# Patient Record
Sex: Female | Born: 1950 | Race: White | Hispanic: No | Marital: Married | Smoking: Former smoker
Health system: Southern US, Community
[De-identification: ages and names within clinical notes are randomized; demographics above are authoritative.]

## PROBLEM LIST (undated history)

## (undated) DIAGNOSIS — I5020 Unspecified systolic (congestive) heart failure: Secondary | ICD-10-CM

## (undated) DIAGNOSIS — I739 Peripheral vascular disease, unspecified: Secondary | ICD-10-CM

## (undated) DIAGNOSIS — R519 Headache, unspecified: Secondary | ICD-10-CM

## (undated) DIAGNOSIS — E7800 Pure hypercholesterolemia, unspecified: Secondary | ICD-10-CM

## (undated) DIAGNOSIS — N809 Endometriosis, unspecified: Secondary | ICD-10-CM

## (undated) DIAGNOSIS — R0600 Dyspnea, unspecified: Secondary | ICD-10-CM

## (undated) DIAGNOSIS — R51 Headache: Secondary | ICD-10-CM

## (undated) DIAGNOSIS — I2510 Atherosclerotic heart disease of native coronary artery without angina pectoris: Principal | ICD-10-CM

## (undated) DIAGNOSIS — I10 Essential (primary) hypertension: Secondary | ICD-10-CM

## (undated) HISTORY — DX: Peripheral vascular disease, unspecified: I73.9

## (undated) HISTORY — PX: CORONARY ARTERY BYPASS GRAFT: SHX141

## (undated) HISTORY — DX: Atherosclerotic heart disease of native coronary artery without angina pectoris: I25.10

## (undated) HISTORY — DX: Pure hypercholesterolemia, unspecified: E78.00

## (undated) HISTORY — PX: EYE SURGERY: SHX253

## (undated) HISTORY — DX: Endometriosis, unspecified: N80.9

## (undated) HISTORY — PX: ABDOMINAL HYSTERECTOMY: SHX81

## (undated) HISTORY — PX: VASCULAR SURGERY: SHX849

## (undated) HISTORY — DX: Essential (primary) hypertension: I10

---

## 2001-02-02 DIAGNOSIS — I2510 Atherosclerotic heart disease of native coronary artery without angina pectoris: Secondary | ICD-10-CM

## 2001-02-02 HISTORY — DX: Atherosclerotic heart disease of native coronary artery without angina pectoris: I25.10

## 2002-02-02 HISTORY — PX: BREAST SURGERY: SHX581

## 2002-09-27 ENCOUNTER — Ambulatory Visit (HOSPITAL_COMMUNITY): Admission: RE | Admit: 2002-09-27 | Discharge: 2002-09-28 | Payer: Self-pay | Admitting: *Deleted

## 2003-12-04 ENCOUNTER — Ambulatory Visit: Payer: Self-pay | Admitting: Internal Medicine

## 2004-02-19 ENCOUNTER — Ambulatory Visit: Payer: Self-pay | Admitting: *Deleted

## 2005-02-02 HISTORY — PX: OTHER SURGICAL HISTORY: SHX169

## 2005-10-06 ENCOUNTER — Ambulatory Visit: Payer: Self-pay | Admitting: Internal Medicine

## 2006-10-18 ENCOUNTER — Ambulatory Visit: Payer: Self-pay | Admitting: Internal Medicine

## 2008-11-29 ENCOUNTER — Encounter: Payer: Self-pay | Admitting: Internal Medicine

## 2008-12-03 ENCOUNTER — Encounter: Payer: Self-pay | Admitting: Internal Medicine

## 2009-01-02 ENCOUNTER — Encounter: Payer: Self-pay | Admitting: Internal Medicine

## 2009-01-30 ENCOUNTER — Ambulatory Visit: Payer: Self-pay | Admitting: Family Medicine

## 2009-02-02 ENCOUNTER — Encounter: Payer: Self-pay | Admitting: Internal Medicine

## 2009-04-19 ENCOUNTER — Emergency Department: Payer: Self-pay | Admitting: Emergency Medicine

## 2010-09-27 ENCOUNTER — Ambulatory Visit: Payer: Self-pay | Admitting: Family Medicine

## 2010-10-03 ENCOUNTER — Ambulatory Visit: Payer: Self-pay | Admitting: Otolaryngology

## 2011-06-04 LAB — IFOBT (OCCULT BLOOD): IFOBT: NEGATIVE

## 2011-12-01 ENCOUNTER — Ambulatory Visit (INDEPENDENT_AMBULATORY_CARE_PROVIDER_SITE_OTHER): Payer: PRIVATE HEALTH INSURANCE | Admitting: Internal Medicine

## 2011-12-01 VITALS — BP 128/82 | HR 72 | Temp 98.5°F | Ht 68.0 in | Wt 178.0 lb

## 2011-12-01 DIAGNOSIS — I739 Peripheral vascular disease, unspecified: Secondary | ICD-10-CM

## 2011-12-01 DIAGNOSIS — R5383 Other fatigue: Secondary | ICD-10-CM

## 2011-12-01 DIAGNOSIS — E7800 Pure hypercholesterolemia, unspecified: Secondary | ICD-10-CM | POA: Insufficient documentation

## 2011-12-01 DIAGNOSIS — I2510 Atherosclerotic heart disease of native coronary artery without angina pectoris: Secondary | ICD-10-CM | POA: Insufficient documentation

## 2011-12-01 DIAGNOSIS — I10 Essential (primary) hypertension: Secondary | ICD-10-CM

## 2011-12-01 MED ORDER — NITROGLYCERIN 0.4 MG SL SUBL: 0.4000 mg | SUBLINGUAL_TABLET | SUBLINGUAL | Status: AC | PRN

## 2011-12-01 MED ORDER — FUROSEMIDE 40 MG PO TABS: 40.0000 mg | ORAL_TABLET | Freq: Every day | ORAL | Status: DC

## 2011-12-01 MED ORDER — CLOPIDOGREL BISULFATE 75 MG PO TABS: 75.0000 mg | ORAL_TABLET | Freq: Every day | ORAL | Status: DC

## 2011-12-01 MED ORDER — SPIRONOLACTONE 25 MG PO TABS: 25.0000 mg | ORAL_TABLET | Freq: Every day | ORAL | Status: DC

## 2011-12-01 MED ORDER — ISOSORBIDE MONONITRATE ER 60 MG PO TB24: 120.0000 mg | ORAL_TABLET | Freq: Every day | ORAL | Status: DC

## 2011-12-01 MED ORDER — ALPRAZOLAM 0.5 MG PO TABS: 0.5000 mg | ORAL_TABLET | Freq: Three times a day (TID) | ORAL | Status: DC | PRN

## 2011-12-01 MED ORDER — CARVEDILOL 3.125 MG PO TABS: 3.1250 mg | ORAL_TABLET | Freq: Two times a day (BID) | ORAL | Status: AC

## 2011-12-01 MED ORDER — ISOSORBIDE MONONITRATE ER 30 MG PO TB24: 120.0000 mg | ORAL_TABLET | Freq: Every evening | ORAL | Status: DC

## 2011-12-01 MED ORDER — PRAVASTATIN SODIUM 80 MG PO TABS: 80.0000 mg | ORAL_TABLET | Freq: Every day | ORAL | Status: DC

## 2011-12-01 MED ORDER — CEFDINIR 300 MG PO CAPS: 300.0000 mg | ORAL_CAPSULE | Freq: Two times a day (BID) | ORAL | Status: DC

## 2011-12-01 NOTE — Patient Instructions (Addendum)
It was nice seeing you today.  I am sorry to hear about your family.  Let me know if you need anything.  I am going to give you a prescription for Omnicef to have if needed.

## 2011-12-02 ENCOUNTER — Telehealth: Payer: Self-pay | Admitting: *Deleted

## 2011-12-02 NOTE — Telephone Encounter (Signed)
Left message on home phone for patient to return call, concerning rx through Dr. Lorin Picket.

## 2011-12-07 ENCOUNTER — Encounter: Payer: Self-pay | Admitting: Internal Medicine

## 2011-12-07 DIAGNOSIS — I739 Peripheral vascular disease, unspecified: Secondary | ICD-10-CM | POA: Insufficient documentation

## 2011-12-07 NOTE — Assessment & Plan Note (Signed)
Blood pressure is doing well. Same meds.  Recent met b 11/25/11 wnl.

## 2011-12-07 NOTE — Assessment & Plan Note (Signed)
Low cholesterol diet and exercise.  On Pravastatin.  FLP 11/25/11 revealed total cholesterol 285, triglycerides 305, HDL 39 and LDL 186.  Work on diet and exercise.  Unable to change statins.

## 2011-12-07 NOTE — Progress Notes (Signed)
  Subjective:    [name], female    DOB: [birth year], 61 y.o.   MRN: [id number]  HPI 61 year old female with past history of CAD s/p CABG and massive MI, hypertension, hypercholesterolemia and peripheral vascular disease who comes in today for a scheduled follow up.  States she has been doing relatively well now.  Has been under a lot of stress recently.  States in Oct 02, 2022 her brother was found dead.  In 11/30/2011 her sister died of a drug overdose and in 2022-07-02 her father died.  She reports still having frequent episodes of angina.  Takes NTG and this relieves.  On daily Isosorbide.  Has follow up with cardiology next month.  She feels her symptoms are stable. Feels she is handling stress relatively well.  Doing better now.  She does report having problems recently with sore throat, congestion (sinus and throat).  Feels like her typical sinus infection.  Breathing is stable.  She is having pain in her legs with walking.  Has to stop and rest.  Has been evaluated by vascular surgery.   Past Medical History  Diagnosis Date  . Hypertension   . Hypercholesteremia   . CAD (coronary artery disease) 2003    s/p CABG x 5 (Dr Silvestre Mesi), MI- 2010  . PVD (peripheral vascular disease)     s/p intervention (left) - 2004, right (2006)  . Endometriosis     Review of Systems Patient denies any headache, lightheadedness or dizziness.  Does report increased sinus and throat congestion.  Increased sinus pressure.  Occasional blood tinged mucus form her nose.  Sore throat. Chest pain - intermittent.  Relieved with NTG.  No increased shortness of breath, cough or congestion.  No nausea or vomiting.  No abdominal pain or cramping.  No bowel change, such as diarrhea, constipation, BRBPR or melana.  States her bowels are doing well. No urine change.        Objective:   Physical Exam Filed Vitals:   12/01/11 0943  BP: 128/82  Pulse: 72  Temp: 98.5 F (25.101 C)   61 year old female in no acute distress.     HEENT:  Nares - clear except slightly erythematous turbinates.  TMs visualized without erythema.  Minimal tenderness to palpation over the sinuses.  OP- without lesions or erythema.  NECK:  Supple, nontender.    HEART:  Appears to be regular. LUNGS:  Without crackles or wheezing audible.  Respirations even and unlabored.   RADIAL PULSE:  Equal bilaterally.  ABDOMEN:  Soft, nontender.  No audible abdominal bruit.   EXTREMITIES:  No increased edema to be present.                     Assessment & Plan:  SINUSITIS.  Will treat with Omnicef 300mg  2/day as directed.  Saline nasal flushes - as tolerated.  Folllow.  Notify me if symptoms do not resolve.   INCREASED PSYCHOSOCIAL STRESSORS.  Feels she is handling things relatively well.  Does not feel she needs any further intervention at this time.  Uses Xanax prn.   HEALTH MAINTENANCE.  Will schedule a physical when due. Obtain outside records for review.

## 2011-12-07 NOTE — Assessment & Plan Note (Signed)
She feels symptoms relatively stable.  Continues follow up with Dr Campbell.  Continue risk factor modification.  Continue current meds.      

## 2011-12-16 ENCOUNTER — Other Ambulatory Visit: Payer: Self-pay | Admitting: Internal Medicine

## 2011-12-16 ENCOUNTER — Other Ambulatory Visit (INDEPENDENT_AMBULATORY_CARE_PROVIDER_SITE_OTHER): Payer: PRIVATE HEALTH INSURANCE

## 2011-12-16 DIAGNOSIS — R5383 Other fatigue: Secondary | ICD-10-CM

## 2011-12-16 DIAGNOSIS — I10 Essential (primary) hypertension: Secondary | ICD-10-CM

## 2011-12-16 DIAGNOSIS — E7800 Pure hypercholesterolemia, unspecified: Secondary | ICD-10-CM

## 2011-12-16 DIAGNOSIS — R5381 Other malaise: Secondary | ICD-10-CM

## 2011-12-16 LAB — CBC WITH DIFFERENTIAL/PLATELET
Basophils Relative: 1.1 % (ref 0.0–3.0)
Eosinophils Relative: 2.5 % (ref 0.0–5.0)
Hemoglobin: 13 g/dL (ref 12.0–15.0)
Lymphocytes Relative: 34.8 % (ref 12.0–46.0)
MCV: 90.7 fl (ref 78.0–100.0)
Monocytes Absolute: 0.3 10*3/uL (ref 0.1–1.0)
Neutrophils Relative %: 55.4 % (ref 43.0–77.0)
RBC: 4.29 Mil/uL (ref 3.87–5.11)
WBC: 5 10*3/uL (ref 4.5–10.5)

## 2011-12-16 LAB — BASIC METABOLIC PANEL
CO2: 26 mEq/L (ref 19–32)
Calcium: 9.3 mg/dL (ref 8.4–10.5)
Chloride: 104 mEq/L (ref 96–112)
Potassium: 4.4 mEq/L (ref 3.5–5.1)
Sodium: 138 mEq/L (ref 135–145)

## 2011-12-16 LAB — HEPATIC FUNCTION PANEL
ALT: 23 U/L (ref 0–35)
Total Bilirubin: 0.6 mg/dL (ref 0.3–1.2)

## 2011-12-16 LAB — LIPID PANEL
HDL: 42 mg/dL (ref >39.00)
Triglycerides: 224 mg/dL — ABNORMAL HIGH (ref 0.0–149.0)
VLDL: 44.8 mg/dL — ABNORMAL HIGH (ref 0.0–40.0)

## 2012-01-04 ENCOUNTER — Ambulatory Visit: Payer: Self-pay | Admitting: Vascular Surgery

## 2012-01-04 LAB — CREATININE, SERUM
Creatinine: 0.97 mg/dL (ref 0.60–1.30)
EGFR (Non-African Amer.): >60

## 2012-01-04 LAB — BUN: BUN: 11 mg/dL (ref 7–18)

## 2012-02-24 ENCOUNTER — Telehealth: Payer: Self-pay | Admitting: Internal Medicine

## 2012-02-24 NOTE — Telephone Encounter (Signed)
Per Dr .Lorin Picket schedule a CPE after her last one 05/22/11. Left message for pt to call back and schedule. Once scheduled please write on paper medical records (top shelf wooden cabinet [facility]) write time and date on records and give back to Dr. Lorin Picket.

## 2012-07-05 ENCOUNTER — Encounter: Payer: Self-pay | Admitting: Internal Medicine

## 2012-07-05 ENCOUNTER — Ambulatory Visit (INDEPENDENT_AMBULATORY_CARE_PROVIDER_SITE_OTHER): Payer: BC Managed Care – PPO | Admitting: Internal Medicine

## 2012-07-05 VITALS — BP 110/70 | HR 79 | Temp 97.6°F | Ht 66.5 in | Wt 160.5 lb

## 2012-07-05 DIAGNOSIS — E7800 Pure hypercholesterolemia, unspecified: Secondary | ICD-10-CM

## 2012-07-05 DIAGNOSIS — I2510 Atherosclerotic heart disease of native coronary artery without angina pectoris: Secondary | ICD-10-CM

## 2012-07-05 DIAGNOSIS — I739 Peripheral vascular disease, unspecified: Secondary | ICD-10-CM

## 2012-07-05 DIAGNOSIS — Z1211 Encounter for screening for malignant neoplasm of colon: Secondary | ICD-10-CM

## 2012-07-05 DIAGNOSIS — I10 Essential (primary) hypertension: Secondary | ICD-10-CM

## 2012-07-05 MED ORDER — ALPRAZOLAM 0.5 MG PO TABS: 0.5000 mg | ORAL_TABLET | Freq: Three times a day (TID) | ORAL | Status: DC | PRN

## 2012-07-09 ENCOUNTER — Encounter: Payer: Self-pay | Admitting: Internal Medicine

## 2012-07-09 NOTE — Assessment & Plan Note (Signed)
Low cholesterol diet and exercise.  On Pravastatin.  Work on diet and exercise.  Has adjusted her diet.  Unable to change statins.  Check lipid panel and liver function.

## 2012-07-09 NOTE — Assessment & Plan Note (Signed)
Blood pressure is doing well. Same meds.  Follow metabolic panel.

## 2012-07-09 NOTE — Assessment & Plan Note (Signed)
She feels symptoms relatively stable.  Continues follow up with Dr Orvan Falconer.  Continue risk factor modification.  Continue current meds.

## 2012-07-09 NOTE — Assessment & Plan Note (Signed)
Continue to follow up with vascular surgery.  Continue risk factor modification.  She is planning to see Dr Rebeca Alert next week for a second opinion.

## 2012-07-09 NOTE — Progress Notes (Signed)
Subjective:    [name], female    DOB: 1950/12/17, 62 y.o.   MRN: [id number]  HPI 62 year old female with past history of CAD s/p CABG and massive MI, hypertension, hypercholesterolemia and peripheral vascular disease who comes in today to follow up on these issues as well as for a complete physical exam.  States she has been doing relatively well now.  Has been under a lot of stress recently.  See last note for details.  Feels she is handling things relatively well.  Has follow up with cardiology in two weeks.   She feels her symptoms are stable.  Still having some spasma.  Takes NTG.  On isosorbide.  Undergoing massage therapy.  Helping.  She has adjusted her diet.  Stays active.  Has seen Dr Wyn Quaker.  Had a stent placed 12/13.  Planning to see Dr Baldemar Friday next week for a second opinion.     Past Medical History  Diagnosis Date  . Hypertension   . Hypercholesteremia   . CAD (coronary artery disease) 2003    s/p CABG x 5 (Dr Silvestre Mesi), MI- 2010  . PVD (peripheral vascular disease)     s/p intervention (left) - 2004, right (2006)  . Endometriosis     Current Outpatient Prescriptions on File Prior to Visit  Medication Sig Dispense Refill  . carvedilol (COREG) 3.125 MG tablet Take 1 tablet (3.125 mg total) by mouth 2 (two) times daily with a meal.  90 tablet  1  . clopidogrel (PLAVIX) 75 MG tablet Take 1 tablet (75 mg total) by mouth daily.  90 tablet  3  . furosemide (LASIX) 40 MG tablet Take 1 tablet (40 mg total) by mouth daily.  30 tablet  3  . isosorbide mononitrate (IMDUR) 120 MG 24 hr tablet Take 120 mg by mouth every morning.      . isosorbide mononitrate (IMDUR) 60 MG 24 hr tablet Take 60 mg by mouth every evening.      . nitroGLYCERIN (NITROSTAT) 0.4 MG SL tablet Place 1 tablet (0.4 mg total) under the tongue every 5 (five) minutes as needed for chest pain.  50 tablet  3  . pravastatin (PRAVACHOL) 80 MG tablet Take 1 tablet (80 mg total) by mouth daily.  90 tablet  3  .  spironolactone (ALDACTONE) 25 MG tablet 25 mg. Take 1/2 tablet daily       No current facility-administered medications on file prior to visit.    Review of Systems Patient denies any headache, lightheadedness or dizziness.  Chest pain - intermittent.  Relieved with NTG.  See above.  Stable.  No increased shortness of breath, cough or congestion.  No nausea or vomiting.  No abdominal pain or cramping.  No bowel change, such as diarrhea, constipation, BRBPR or melana.  States her bowels are doing well. No urine change.  Feels she is handling stress well.  Has adjusted her diet.        Objective:   Physical Exam  Filed Vitals:   07/05/12 1046  BP: 110/70  Pulse: 79  Temp: 97.6 F (36.4 C)   Blood pressure recheck:  120/68, pulse 69  62 year old female in no acute distress.   HEENT:  Nares- clear.  Oropharynx - without lesions. NECK:  Supple.  Nontender.  No audible bruit.  HEART:  Appears to be regular. LUNGS:  No crackles or wheezing audible.  Respirations even and unlabored.  RADIAL PULSE:  Equal bilaterally.  BREASTS:  No nipple discharge or nipple retraction present.  Could not appreciate any distinct nodules or axillary adenopathy.  ABDOMEN:  Soft, nontender.  Bowel sounds present and normal.  No audible abdominal bruit.  GU:  Normal external genitalia.  Vaginal vault without lesions.  S/P hysterectomy.  Could not appreciate any adnexal masses or tenderness.   RECTAL:  Heme negative.   EXTREMITIES:  No increased edema present.  DP pulses palpable and equal bilaterally.                    Assessment & Plan:  SINUSITIS.  Resolved.  Not an issue for her now.    INCREASED PSYCHOSOCIAL STRESSORS.  Feels she is handling things relatively well.  Does not feel she needs any further intervention at this time.  Uses Xanax prn.   HEALTH MAINTENANCE.  Physical today.  Declines mammogram.  Will notify me when agreeable for colonoscopy.

## 2012-07-12 ENCOUNTER — Other Ambulatory Visit (INDEPENDENT_AMBULATORY_CARE_PROVIDER_SITE_OTHER): Payer: BC Managed Care – PPO

## 2012-07-12 DIAGNOSIS — Z1211 Encounter for screening for malignant neoplasm of colon: Secondary | ICD-10-CM

## 2012-07-15 ENCOUNTER — Telehealth: Payer: Self-pay | Admitting: *Deleted

## 2012-07-15 ENCOUNTER — Other Ambulatory Visit (INDEPENDENT_AMBULATORY_CARE_PROVIDER_SITE_OTHER): Payer: BC Managed Care – PPO

## 2012-07-15 DIAGNOSIS — I10 Essential (primary) hypertension: Secondary | ICD-10-CM

## 2012-07-15 DIAGNOSIS — E7800 Pure hypercholesterolemia, unspecified: Secondary | ICD-10-CM

## 2012-07-15 LAB — LIPID PANEL
HDL: 36.9 mg/dL — ABNORMAL LOW (ref >39.00)
Total CHOL/HDL Ratio: 7
Triglycerides: 309 mg/dL — ABNORMAL HIGH (ref 0.0–149.0)

## 2012-07-15 LAB — BASIC METABOLIC PANEL
CO2: 27 mEq/L (ref 19–32)
GFR: 62.62 mL/min (ref >60.00)
Glucose, Bld: 98 mg/dL (ref 70–99)
Potassium: 4.5 mEq/L (ref 3.5–5.1)
Sodium: 141 mEq/L (ref 135–145)

## 2012-07-15 LAB — HEPATIC FUNCTION PANEL: Total Bilirubin: 0.7 mg/dL (ref 0.3–1.2)

## 2012-07-15 NOTE — Telephone Encounter (Signed)
Pt would like for now on her results be mailed to her

## 2012-07-15 NOTE — Telephone Encounter (Signed)
Noted & added to the [facility]

## 2012-08-04 ENCOUNTER — Telehealth: Payer: Self-pay | Admitting: Internal Medicine

## 2012-08-04 NOTE — Telephone Encounter (Signed)
Pt does not see need to come in for hospital f/u. Pt has been scheduled for surgery on her other leg soon and does not see the need to come in now and then come in again after her next surgery.  She states she has soreness and some bruising but otherwise is doing well from the surgery.  Pt asks to contact her if there is any other reason she needs to come in to be seen before she has her next surgery.  Hospital f/u has been cancelled at this time at pt request.

## 2012-08-04 NOTE — Telephone Encounter (Signed)
FYI

## 2012-08-08 ENCOUNTER — Ambulatory Visit: Payer: BC Managed Care – PPO | Admitting: Internal Medicine

## 2012-08-22 ENCOUNTER — Ambulatory Visit (INDEPENDENT_AMBULATORY_CARE_PROVIDER_SITE_OTHER): Payer: BC Managed Care – PPO | Admitting: Internal Medicine

## 2012-08-22 ENCOUNTER — Encounter: Payer: Self-pay | Admitting: Internal Medicine

## 2012-08-22 VITALS — BP 120/70 | HR 74 | Temp 98.0°F | Ht 66.5 in | Wt 157.0 lb

## 2012-08-22 DIAGNOSIS — E7800 Pure hypercholesterolemia, unspecified: Secondary | ICD-10-CM

## 2012-08-22 DIAGNOSIS — I2510 Atherosclerotic heart disease of native coronary artery without angina pectoris: Secondary | ICD-10-CM

## 2012-08-22 DIAGNOSIS — I10 Essential (primary) hypertension: Secondary | ICD-10-CM

## 2012-08-22 DIAGNOSIS — T148XXA Other injury of unspecified body region, initial encounter: Secondary | ICD-10-CM

## 2012-08-22 DIAGNOSIS — I739 Peripheral vascular disease, unspecified: Secondary | ICD-10-CM

## 2012-08-22 NOTE — Progress Notes (Signed)
Subjective:    [name], female    DOB: 04/01/1950, 62 y.o.   MRN: [id number]  HPI 62 year old female with past history of CAD s/p CABG and massive MI, hypertension, hypercholesterolemia and peripheral vascular disease who comes in today for a follow up.  Just had an intervention on her left leg.  They wanted her to follow up here post procedure.  Had two stents placed.  Performed by  Dr Baldemar Friday.  Had increased bleeding and bruising s/p procedure.  Is doing better now, but still has increased pain in the suprapubic region and lower abdomen.  Pain has improved.  Feels from a cardiac standpoint - things are stable. Breathing stable.  Eating and drinking well.  Trying to watch what she is eating.  Back on her cholesterol medication now.    Past Medical History  Diagnosis Date  . Hypertension   . Hypercholesteremia   . CAD (coronary artery disease) 2003    s/p CABG x 5 (Dr Silvestre Mesi), MI- 2010  . PVD (peripheral vascular disease)     s/p intervention (left) - 2004, right (2006)  . Endometriosis     Current Outpatient Prescriptions on File Prior to Visit  Medication Sig Dispense Refill  . ALPRAZolam (XANAX) 0.5 MG tablet Take 1 tablet (0.5 mg total) by mouth 3 (three) times daily as needed for sleep.  180 tablet  1  . carvedilol (COREG) 3.125 MG tablet Take 1 tablet (3.125 mg total) by mouth 2 (two) times daily with a meal.  90 tablet  1  . clopidogrel (PLAVIX) 75 MG tablet Take 1 tablet (75 mg total) by mouth daily.  90 tablet  3  . furosemide (LASIX) 40 MG tablet Take 1 tablet (40 mg total) by mouth daily.  30 tablet  3  . isosorbide mononitrate (IMDUR) 60 MG 24 hr tablet Take 60 mg by mouth 2 (two) times daily. 120mg -am, 60mg -pm      . nitroGLYCERIN (NITROSTAT) 0.4 MG SL tablet Place 1 tablet (0.4 mg total) under the tongue every 5 (five) minutes as needed for chest pain.  50 tablet  3  . pravastatin (PRAVACHOL) 80 MG tablet Take 1 tablet (80 mg total) by mouth daily.  90 tablet   3  . spironolactone (ALDACTONE) 25 MG tablet 25 mg. Take 1/2 tablet daily       No current facility-administered medications on file prior to visit.    Review of Systems Patient denies any headache, lightheadedness or dizziness.  Feels from a cardiac standpoint things are stable.  No increased shortness of breath, cough or congestion.  No nausea or vomiting.   No bowel change, such as diarrhea, constipation, BRBPR or melana.  States her bowels are doing well. No urine change.  Feels she is handling stress well.  Has adjusted her diet.  Is s/p stent placement x 2 (right leg).  Increased bleeding and bruising s/p procedure.  Some suprapubic and lower abdominal discomfort.  Has improved.  Right leg feels better.       Objective:   Physical Exam  Filed Vitals:   08/22/12 1502  BP: 120/70  Pulse: 74  Temp: 98 F (36.7 C)   Blood pressure recheck:  138-140/62, pulse 48  62 year old female in no acute distress.   HEENT:  Nares- clear.  Oropharynx - without lesions. NECK:  Supple.  Nontender.    HEART:  Appears to be regular. LUNGS:  No crackles or wheezing audible.  Respirations even  and unlabored.  RADIAL PULSE:  Equal bilaterally.  ABDOMEN:  Soft.  Minimal tenderness to palpation over the lower abdomen.  Bowel sounds present and normal.  No rebound or guarding.  Some minimal suprapubic tenderness to palpation.  Audible bruit over the right femoral area.   EXTREMITIES:  No increased edema present.  DP pulses palpable and equal bilaterally.                    Assessment & Plan:  VASCULAR.  S/p stent x 2 - left leg.  Had increased bleeding and bruising s/p procedure.  Audible bruit over the femoral site.  Discussed with Dr Sabino Gasser office.  They will follow up with pt regarding further evaluation.  Will check cbc to confirm hgb stable.   INCREASED PSYCHOSOCIAL STRESSORS.  Feels she is handling things relatively well.  Does not feel she needs any further intervention at this time.  Uses  Xanax prn.   HEALTH MAINTENANCE.  Physical 07/05/12.  Declines mammogram.  Will notify me when agreeable for colonoscopy.

## 2012-08-23 ENCOUNTER — Encounter: Payer: Self-pay | Admitting: Internal Medicine

## 2012-08-23 LAB — CBC WITH DIFFERENTIAL/PLATELET
Basophils Absolute: 0.1 10*3/uL (ref 0.0–0.1)
HCT: 37.3 % (ref 36.0–46.0)
Lymphs Abs: 1.8 10*3/uL (ref 0.7–4.0)
Monocytes Absolute: 0.3 10*3/uL (ref 0.1–1.0)
Monocytes Relative: 5.2 % (ref 3.0–12.0)
Platelets: 220 10*3/uL (ref 150.0–400.0)
RDW: 14.7 % — ABNORMAL HIGH (ref 11.5–14.6)

## 2012-08-23 NOTE — Assessment & Plan Note (Signed)
She feels symptoms relatively stable.  Continues follow up with Dr Orvan Falconer.  Continue risk factor modification.  Continue current meds.

## 2012-08-23 NOTE — Assessment & Plan Note (Signed)
Continue to follow up with vascular surgery.  S/p stent x 2 recently performed by Dr Rebeca Alert.  Had increased bleeding and bruising.  With audible bruit over the femoral site.  Discussed with Dr Carmina Miller office.  They will contact pt regarding further w/up.

## 2012-08-23 NOTE — Assessment & Plan Note (Signed)
Recent cholesterol elevated.  Back on her cholesterol medication now.  Trying to watch her diet.  Follow.

## 2012-08-23 NOTE — Assessment & Plan Note (Signed)
Blood pressure has been doing well. Same meds.  Follow metabolic panel.    

## 2012-09-06 ENCOUNTER — Encounter: Payer: Self-pay | Admitting: Internal Medicine

## 2013-01-04 ENCOUNTER — Ambulatory Visit (INDEPENDENT_AMBULATORY_CARE_PROVIDER_SITE_OTHER): Payer: BC Managed Care – PPO | Admitting: Internal Medicine

## 2013-01-04 ENCOUNTER — Other Ambulatory Visit: Payer: Self-pay | Admitting: *Deleted

## 2013-01-04 ENCOUNTER — Encounter (INDEPENDENT_AMBULATORY_CARE_PROVIDER_SITE_OTHER): Payer: Self-pay

## 2013-01-04 ENCOUNTER — Encounter: Payer: Self-pay | Admitting: Internal Medicine

## 2013-01-04 VITALS — BP 120/80 | HR 79 | Temp 98.3°F | Ht 66.5 in | Wt 160.5 lb

## 2013-01-04 DIAGNOSIS — R82998 Other abnormal findings in urine: Secondary | ICD-10-CM

## 2013-01-04 DIAGNOSIS — I2510 Atherosclerotic heart disease of native coronary artery without angina pectoris: Secondary | ICD-10-CM

## 2013-01-04 DIAGNOSIS — R8290 Unspecified abnormal findings in urine: Secondary | ICD-10-CM

## 2013-01-04 DIAGNOSIS — E7800 Pure hypercholesterolemia, unspecified: Secondary | ICD-10-CM

## 2013-01-04 DIAGNOSIS — I10 Essential (primary) hypertension: Secondary | ICD-10-CM

## 2013-01-04 DIAGNOSIS — I739 Peripheral vascular disease, unspecified: Secondary | ICD-10-CM

## 2013-01-04 DIAGNOSIS — M722 Plantar fascial fibromatosis: Secondary | ICD-10-CM

## 2013-01-04 LAB — URINALYSIS, ROUTINE W REFLEX MICROSCOPIC
Ketones, ur: NEGATIVE
Nitrite: NEGATIVE
RBC / HPF: NONE SEEN (ref 0–?)
Specific Gravity, Urine: 1.01 (ref 1.000–1.030)
Total Protein, Urine: NEGATIVE
pH: 6 (ref 5.0–8.0)

## 2013-01-04 LAB — BASIC METABOLIC PANEL
Calcium: 9.5 mg/dL (ref 8.4–10.5)
Chloride: 106 mEq/L (ref 96–112)
Creatinine, Ser: 0.9 mg/dL (ref 0.4–1.2)
GFR: 64.85 mL/min (ref >60.00)

## 2013-01-04 LAB — HEPATIC FUNCTION PANEL
Albumin: 4.1 g/dL (ref 3.5–5.2)
Alkaline Phosphatase: 57 U/L (ref 39–117)
Bilirubin, Direct: 0 mg/dL (ref 0.0–0.3)

## 2013-01-04 LAB — LDL CHOLESTEROL, DIRECT: Direct LDL: 207 mg/dL

## 2013-01-04 LAB — LIPID PANEL
Cholesterol: 293 mg/dL — ABNORMAL HIGH (ref 0–200)
Triglycerides: 320 mg/dL — ABNORMAL HIGH (ref 0.0–149.0)

## 2013-01-04 MED ORDER — ALPRAZOLAM 0.5 MG PO TABS: 0.5000 mg | ORAL_TABLET | Freq: Three times a day (TID) | ORAL | Status: DC | PRN

## 2013-01-04 NOTE — Telephone Encounter (Signed)
Pt was seen this morning & forgot to get a refill

## 2013-01-04 NOTE — Telephone Encounter (Signed)
Refilled xanax #[phone] with 1 refill

## 2013-01-04 NOTE — Progress Notes (Signed)
Pre-visit discussion using our clinic review tool. No additional management support is needed unless otherwise documented below in the visit note.  

## 2013-01-04 NOTE — Progress Notes (Signed)
Subjective:    [name], female    DOB: 10/19/1950, 62 y.o.   MRN: [id number]  HPI 62 year old female with past history of CAD s/p CABG and massive MI, hypertension, hypercholesterolemia and peripheral vascular disease who comes in today for a follow up.  Recently had an intervention on her left leg.  Had two stents placed.  Performed by  Dr Baldemar Friday.  Currently stable.  She does have some pain in the popliteal region.  Had flow checked s/p procedure and was told flow ok.  Plans to f/u here with Dr Wyn Quaker.  She prefers to make her own appt.  No severe pain.   She was questioning if some of the pain could be coming from her feet.  Feels from a cardiac standpoint - things are stable.  Has f/u with her cardiologist this month.  Breathing stable.  Eating and drinking well.  Trying to watch what she is eating.  Not taking her cholesterol medication.  She has been off for months.  Taking krill oil for her cholesterol medication.   Feels that the pravastatin was causing increased muscle aches.  She is seeing Dr Ether Griffins for her plantar fasciitis.  Injection helped.     Past Medical History  Diagnosis Date  . Hypertension   . Hypercholesteremia   . CAD (coronary artery disease) 2003    s/p CABG x 5 (Dr Silvestre Mesi), MI- 2010  . PVD (peripheral vascular disease)     s/p intervention (left) - 2004, right (2006)  . Endometriosis     Current Outpatient Prescriptions on File Prior to Visit  Medication Sig Dispense Refill  . ALPRAZolam (XANAX) 0.5 MG tablet Take 1 tablet (0.5 mg total) by mouth 3 (three) times daily as needed for sleep.  180 tablet  1  . carvedilol (COREG) 3.125 MG tablet Take 1 tablet (3.125 mg total) by mouth 2 (two) times daily with a meal.  90 tablet  1  . clopidogrel (PLAVIX) 75 MG tablet Take 1 tablet (75 mg total) by mouth daily.  90 tablet  3  . furosemide (LASIX) 40 MG tablet Take 1 tablet (40 mg total) by mouth daily.  30 tablet  3  . isosorbide mononitrate (IMDUR) 60 MG 24  hr tablet Take 60 mg by mouth 2 (two) times daily. 120mg -am, 60mg -pm      . nitroGLYCERIN (NITROSTAT) 0.4 MG SL tablet Place 1 tablet (0.4 mg total) under the tongue every 5 (five) minutes as needed for chest pain.  50 tablet  3  . pravastatin (PRAVACHOL) 80 MG tablet Take 1 tablet (80 mg total) by mouth daily.  90 tablet  3  . spironolactone (ALDACTONE) 25 MG tablet 25 mg. Take 1/2 tablet daily       No current facility-administered medications on file prior to visit.    Review of Systems Patient denies any headache, lightheadedness or dizziness.  Feels from a cardiac standpoint things are stable.  No increased shortness of breath, cough or congestion.  No nausea or vomiting.   No bowel change, such as diarrhea, constipation, BRBPR or melana.  States her bowels are doing well.  No urine change.  Feels she is handling stress well.  Has adjusted her diet.  Is s/p stent placement x 2.  Having problems with plantar fasciitis.  Injection helped some.  Not taking her cholesterol medication as outlined.       Objective:   Physical Exam  Filed Vitals:   01/04/13 0847  BP: 120/80  Pulse: 79  Temp: 98.3 F (60.31 C)   62 year old female in no acute distress.   HEENT:  Nares- clear.  Oropharynx - without lesions. NECK:  Supple.  Nontender.    HEART:  Appears to be regular. LUNGS:  No crackles or wheezing audible.  Respirations even and unlabored.  RADIAL PULSE:  Equal bilaterally.  ABDOMEN:  Soft.  Non tender.    EXTREMITIES:  No increased edema present.  No increased erythema or warmth.                    Assessment & Plan:  VASCULAR.  S/p stent x 2 - left leg.  Had flow checked s/p procedure.  States flow ok.  Plans to f/u with Dr Wyn Quaker.  She prefers to make her own appt.     INCREASED PSYCHOSOCIAL STRESSORS.  Feels she is handling things relatively well.  Does not feel she needs any further intervention at this time.  Uses Xanax.   HEALTH MAINTENANCE.  Physical 07/05/12.  Declines mammogram.   Will notify me when agreeable for colonoscopy.

## 2013-01-06 ENCOUNTER — Encounter: Payer: Self-pay | Admitting: *Deleted

## 2013-01-06 ENCOUNTER — Other Ambulatory Visit: Payer: Self-pay | Admitting: Internal Medicine

## 2013-01-06 DIAGNOSIS — E7800 Pure hypercholesterolemia, unspecified: Secondary | ICD-10-CM

## 2013-01-06 LAB — URINE CULTURE
Colony Count: NO GROWTH
Organism ID, Bacteria: NO GROWTH

## 2013-01-06 NOTE — Progress Notes (Signed)
Order placed for f/u liver check.  

## 2013-01-08 ENCOUNTER — Encounter: Payer: Self-pay | Admitting: Internal Medicine

## 2013-01-08 DIAGNOSIS — R0602 Shortness of breath: Secondary | ICD-10-CM | POA: Insufficient documentation

## 2013-01-08 DIAGNOSIS — M722 Plantar fascial fibromatosis: Secondary | ICD-10-CM | POA: Insufficient documentation

## 2013-01-08 NOTE — Assessment & Plan Note (Signed)
Not taking her cholesterol medication.  Feels it caused muscle aches.  Check lipid panel and liver function.  Gave her samples of crestor 20mg .

## 2013-01-08 NOTE — Assessment & Plan Note (Signed)
Check u/a and urine culture to confirm no infection.

## 2013-01-08 NOTE — Assessment & Plan Note (Signed)
She feels symptoms relatively stable.  Continues follow up with Dr Orvan Falconer.  Continue risk factor modification.  Continue current meds.

## 2013-01-08 NOTE — Assessment & Plan Note (Signed)
Sees Dr Ether Griffins.  S/p injection.  Helped some.

## 2013-01-08 NOTE — Assessment & Plan Note (Signed)
Continue to follow up with vascular surgery.  S/p stent x 2 recently performed by Dr Naverro.  Had flow checked s/p procedure.  States flow ok.  Plans to f/u with Dr Dew.     

## 2013-01-08 NOTE — Assessment & Plan Note (Signed)
Blood pressure has been doing well. Same meds.  Follow metabolic panel.    

## 2013-01-09 ENCOUNTER — Other Ambulatory Visit: Payer: Self-pay | Admitting: Internal Medicine

## 2013-01-09 ENCOUNTER — Telehealth: Payer: Self-pay | Admitting: Internal Medicine

## 2013-01-09 DIAGNOSIS — E7800 Pure hypercholesterolemia, unspecified: Secondary | ICD-10-CM

## 2013-01-09 NOTE — Telephone Encounter (Signed)
Pt has labs 02/21/13 she would like you to mail her a copy of the results  Dr Terence Lux @ [facility]  Phone [phone] Fax [phone] Cut her previstation to 40mg  instead of 80.  She starts this tonight 12/8

## 2013-01-09 NOTE — Progress Notes (Signed)
Orders placed for f/u labs.  

## 2013-01-09 NOTE — Telephone Encounter (Signed)
FYI

## 2013-02-02 ENCOUNTER — Inpatient Hospital Stay: Payer: Self-pay | Admitting: Internal Medicine

## 2013-02-02 LAB — BASIC METABOLIC PANEL
Anion Gap: 13 (ref 7–16)
BUN: 6 mg/dL — ABNORMAL LOW (ref 7–18)
CHLORIDE: 104 mmol/L (ref 98–107)
Calcium, Total: 8.6 mg/dL (ref 8.5–10.1)
Co2: 21 mmol/L (ref 21–32)
Creatinine: 1.35 mg/dL — ABNORMAL HIGH (ref 0.60–1.30)
EGFR (African American): 49 — ABNORMAL LOW
EGFR (Non-African Amer.): 42 — ABNORMAL LOW
Glucose: 362 mg/dL — ABNORMAL HIGH (ref 65–99)
Osmolality: 288 (ref 275–301)
Potassium: 3.8 mmol/L (ref 3.5–5.1)
SODIUM: 138 mmol/L (ref 136–145)

## 2013-02-02 LAB — CBC
HCT: 40.8 % (ref 35.0–47.0)
HGB: 13.4 g/dL (ref 12.0–16.0)
MCH: 30.4 pg (ref 26.0–34.0)
MCHC: 32.8 g/dL (ref 32.0–36.0)
MCV: 93 fL (ref 80–100)
PLATELETS: 258 10*3/uL (ref 150–440)
RBC: 4.4 10*6/uL (ref 3.80–5.20)
RDW: 15.1 % — AB (ref 11.5–14.5)
WBC: 10.6 10*3/uL (ref 3.6–11.0)

## 2013-02-02 LAB — URINALYSIS, COMPLETE
Bilirubin,UR: NEGATIVE
Blood: NEGATIVE
Glucose,UR: >=500 mg/dL (ref 0–75)
Hyaline Cast: 1
Ketone: NEGATIVE
Leukocyte Esterase: NEGATIVE
Nitrite: NEGATIVE
Ph: 6 (ref 4.5–8.0)
Protein: 100
SQUAMOUS EPITHELIAL: NONE SEEN
Specific Gravity: 1.008 (ref 1.003–1.030)

## 2013-02-02 LAB — CK TOTAL AND CKMB (NOT AT ARMC)
CK, TOTAL: 115 U/L (ref 21–215)
CK, Total: 241 U/L — ABNORMAL HIGH (ref 21–215)
CK, Total: 274 U/L — ABNORMAL HIGH (ref 21–215)
CK-MB: 1.7 ng/mL (ref 0.5–3.6)
CK-MB: 11.1 ng/mL — AB (ref 0.5–3.6)
CK-MB: 14.6 ng/mL — ABNORMAL HIGH (ref 0.5–3.6)

## 2013-02-02 LAB — APTT
Activated PTT: 123.3 secs — ABNORMAL HIGH (ref 23.6–35.9)
Activated PTT: 78.7 secs — ABNORMAL HIGH (ref 23.6–35.9)

## 2013-02-02 LAB — TROPONIN I
TROPONIN-I: 0.05 ng/mL
Troponin-I: 6.3 ng/mL — ABNORMAL HIGH
Troponin-I: 7.3 ng/mL — ABNORMAL HIGH

## 2013-02-02 LAB — CK-MB: CK-MB: 12.9 ng/mL — ABNORMAL HIGH (ref 0.5–3.6)

## 2013-02-02 LAB — PRO B NATRIURETIC PEPTIDE: B-TYPE NATIURETIC PEPTID: 4007 pg/mL — AB (ref 0–125)

## 2013-02-03 LAB — CBC WITH DIFFERENTIAL/PLATELET
BASOS ABS: 0 10*3/uL (ref 0.0–0.1)
Basophil %: 0.4 %
Eosinophil #: 0 10*3/uL (ref 0.0–0.7)
Eosinophil %: 0 %
HCT: 33.1 % — ABNORMAL LOW (ref 35.0–47.0)
HGB: 11.1 g/dL — ABNORMAL LOW (ref 12.0–16.0)
Lymphocyte #: 1.7 10*3/uL (ref 1.0–3.6)
Lymphocyte %: 23.1 %
MCH: 30 pg (ref 26.0–34.0)
MCHC: 33.6 g/dL (ref 32.0–36.0)
MCV: 89 fL (ref 80–100)
MONO ABS: 0.3 x10 3/mm (ref 0.2–0.9)
Monocyte %: 3.5 %
NEUTROS ABS: 5.4 10*3/uL (ref 1.4–6.5)
Neutrophil %: 73 %
Platelet: 169 10*3/uL (ref 150–440)
RBC: 3.71 10*6/uL — AB (ref 3.80–5.20)
RDW: 14.8 % — ABNORMAL HIGH (ref 11.5–14.5)
WBC: 7.4 10*3/uL (ref 3.6–11.0)

## 2013-02-03 LAB — BASIC METABOLIC PANEL
ANION GAP: 9 (ref 7–16)
Anion Gap: 4 — ABNORMAL LOW (ref 7–16)
BUN: 10 mg/dL (ref 7–18)
BUN: 11 mg/dL (ref 7–18)
CALCIUM: 8 mg/dL — AB (ref 8.5–10.1)
CO2: 25 mmol/L (ref 21–32)
CREATININE: 1.04 mg/dL (ref 0.60–1.30)
Calcium, Total: 8.8 mg/dL (ref 8.5–10.1)
Chloride: 100 mmol/L (ref 98–107)
Chloride: 102 mmol/L (ref 98–107)
Co2: 31 mmol/L (ref 21–32)
Creatinine: 1.1 mg/dL (ref 0.60–1.30)
EGFR (African American): >60
EGFR (African American): >60
EGFR (Non-African Amer.): 54 — ABNORMAL LOW
EGFR (Non-African Amer.): 58 — ABNORMAL LOW
Glucose: 177 mg/dL — ABNORMAL HIGH (ref 65–99)
Glucose: 109 mg/dL — ABNORMAL HIGH (ref 65–99)
OSMOLALITY: 274 (ref 275–301)
Osmolality: 272 (ref 275–301)
POTASSIUM: 3.3 mmol/L — AB (ref 3.5–5.1)
Potassium: 2.6 mmol/L — ABNORMAL LOW (ref 3.5–5.1)
SODIUM: 134 mmol/L — AB (ref 136–145)
SODIUM: 137 mmol/L (ref 136–145)

## 2013-02-03 LAB — MAGNESIUM
Magnesium: 1.4 mg/dL — ABNORMAL LOW
Magnesium: 1.9 mg/dL

## 2013-02-03 LAB — APTT: Activated PTT: 88.9 secs — ABNORMAL HIGH (ref 23.6–35.9)

## 2013-02-04 LAB — BASIC METABOLIC PANEL
Anion Gap: 4 — ABNORMAL LOW (ref 7–16)
BUN: 12 mg/dL (ref 7–18)
CO2: 30 mmol/L (ref 21–32)
CREATININE: 0.99 mg/dL (ref 0.60–1.30)
Calcium, Total: 8.8 mg/dL (ref 8.5–10.1)
Chloride: 100 mmol/L (ref 98–107)
EGFR (Non-African Amer.): >60
Glucose: 116 mg/dL — ABNORMAL HIGH (ref 65–99)
Osmolality: 269 (ref 275–301)
Potassium: 3.3 mmol/L — ABNORMAL LOW (ref 3.5–5.1)
Sodium: 134 mmol/L — ABNORMAL LOW (ref 136–145)

## 2013-02-04 LAB — CBC WITH DIFFERENTIAL/PLATELET
Basophil #: 0 10*3/uL (ref 0.0–0.1)
Basophil %: 0.7 %
EOS ABS: 0 10*3/uL (ref 0.0–0.7)
EOS PCT: 0.3 %
HCT: 36.1 % (ref 35.0–47.0)
HGB: 12.5 g/dL (ref 12.0–16.0)
Lymphocyte #: 1.7 10*3/uL (ref 1.0–3.6)
Lymphocyte %: 26 %
MCH: 30.6 pg (ref 26.0–34.0)
MCHC: 34.5 g/dL (ref 32.0–36.0)
MCV: 89 fL (ref 80–100)
MONOS PCT: 6.6 %
Monocyte #: 0.4 x10 3/mm (ref 0.2–0.9)
NEUTROS PCT: 66.4 %
Neutrophil #: 4.5 10*3/uL (ref 1.4–6.5)
PLATELETS: 178 10*3/uL (ref 150–440)
RBC: 4.07 10*6/uL (ref 3.80–5.20)
RDW: 14.9 % — AB (ref 11.5–14.5)
WBC: 6.7 10*3/uL (ref 3.6–11.0)

## 2013-02-04 LAB — APTT: Activated PTT: 78.8 secs — ABNORMAL HIGH (ref 23.6–35.9)

## 2013-02-04 LAB — MAGNESIUM: Magnesium: 2.2 mg/dL

## 2013-02-04 LAB — RAPID INFLUENZA A&B ANTIGENS

## 2013-02-05 LAB — BASIC METABOLIC PANEL
ANION GAP: 9 (ref 7–16)
BUN: 19 mg/dL — ABNORMAL HIGH (ref 7–18)
CALCIUM: 9.7 mg/dL (ref 8.5–10.1)
Chloride: 100 mmol/L (ref 98–107)
Co2: 25 mmol/L (ref 21–32)
Creatinine: 0.98 mg/dL (ref 0.60–1.30)
EGFR (African American): >60
EGFR (Non-African Amer.): >60
GLUCOSE: 117 mg/dL — AB (ref 65–99)
OSMOLALITY: 272 (ref 275–301)
Potassium: 4 mmol/L (ref 3.5–5.1)
Sodium: 134 mmol/L — ABNORMAL LOW (ref 136–145)

## 2013-02-05 LAB — APTT
Activated PTT: 51.3 secs — ABNORMAL HIGH (ref 23.6–35.9)
Activated PTT: 44.1 secs — ABNORMAL HIGH (ref 23.6–35.9)

## 2013-02-05 LAB — POTASSIUM: POTASSIUM: 3.9 mmol/L (ref 3.5–5.1)

## 2013-02-06 LAB — BASIC METABOLIC PANEL
Anion Gap: 6 — ABNORMAL LOW (ref 7–16)
BUN: 26 mg/dL — AB (ref 7–18)
Calcium, Total: 10 mg/dL (ref 8.5–10.1)
Chloride: 102 mmol/L (ref 98–107)
Co2: 25 mmol/L (ref 21–32)
Creatinine: 1.16 mg/dL (ref 0.60–1.30)
EGFR (Non-African Amer.): 50 — ABNORMAL LOW
GFR CALC AF AMER: 58 — AB
Glucose: 135 mg/dL — ABNORMAL HIGH (ref 65–99)
OSMOLALITY: 273 (ref 275–301)
Potassium: 3.8 mmol/L (ref 3.5–5.1)
Sodium: 133 mmol/L — ABNORMAL LOW (ref 136–145)

## 2013-02-10 ENCOUNTER — Ambulatory Visit: Payer: BC Managed Care – PPO | Admitting: Adult Health

## 2013-02-14 ENCOUNTER — Encounter: Payer: Self-pay | Admitting: Internal Medicine

## 2013-02-14 ENCOUNTER — Ambulatory Visit: Payer: BC Managed Care – PPO | Admitting: Adult Health

## 2013-02-14 ENCOUNTER — Encounter (INDEPENDENT_AMBULATORY_CARE_PROVIDER_SITE_OTHER): Payer: Self-pay

## 2013-02-14 ENCOUNTER — Ambulatory Visit (INDEPENDENT_AMBULATORY_CARE_PROVIDER_SITE_OTHER): Payer: BC Managed Care – PPO | Admitting: Internal Medicine

## 2013-02-14 VITALS — BP 110/80 | HR 68 | Temp 98.3°F | Ht 66.5 in | Wt 161.5 lb

## 2013-02-14 DIAGNOSIS — R0602 Shortness of breath: Secondary | ICD-10-CM

## 2013-02-14 DIAGNOSIS — I10 Essential (primary) hypertension: Secondary | ICD-10-CM

## 2013-02-14 DIAGNOSIS — I739 Peripheral vascular disease, unspecified: Secondary | ICD-10-CM

## 2013-02-14 DIAGNOSIS — I2510 Atherosclerotic heart disease of native coronary artery without angina pectoris: Secondary | ICD-10-CM

## 2013-02-14 DIAGNOSIS — E7800 Pure hypercholesterolemia, unspecified: Secondary | ICD-10-CM

## 2013-02-14 LAB — BASIC METABOLIC PANEL
BUN: 10 mg/dL (ref 6–23)
CALCIUM: 9.6 mg/dL (ref 8.4–10.5)
CO2: 30 meq/L (ref 19–32)
CREATININE: 1.1 mg/dL (ref 0.4–1.2)
Chloride: 100 mEq/L (ref 96–112)
GFR: 56.36 mL/min — AB (ref >60.00)
Glucose, Bld: 100 mg/dL — ABNORMAL HIGH (ref 70–99)
Potassium: 4.2 mEq/L (ref 3.5–5.1)
SODIUM: 140 meq/L (ref 135–145)

## 2013-02-14 NOTE — Progress Notes (Signed)
Subjective:    [name], female    DOB: January 13, 1951, 63 y.o.   MRN: [id number]  HPI 63 year old female with past history of CAD s/p CABG and massive MI, hypertension, hypercholesterolemia and peripheral vascular disease who comes in today for a hospital follow up.  She was just admitted to [facility] for increased sob and some increased mucus.  Was given abx and lasix increased.  Was evaluated by Dr Ubaldo Glassing.  ECHO 02/06/13 revealed EF 30-35%, borderline concentric LVH, mild to moderated mitral valve regurgitation, mild to moderated aortic valve sclerosis without any stenosis, mild to moderate tricuspid regurgitation and moderately elevated pulmonary artery systolic pressure. Breathing better now.  Feels like she is getting back to her baseline.  Eating.  No nausea or vomiting.  No bowel change.  Is planning to f/u with Dr Megan Salon next week.     Past Medical History  Diagnosis Date  . Hypertension   . Hypercholesteremia   . CAD (coronary artery disease) 2003    s/p CABG x 5 (Dr Evelina Dun), MI- 2010  . PVD (peripheral vascular disease)     s/p intervention (left) - 2004, right (2006)  . Endometriosis     Current Outpatient Prescriptions on File Prior to Visit  Medication Sig Dispense Refill  . ALPRAZolam (XANAX) 0.5 MG tablet Take 1 tablet (0.5 mg total) by mouth 3 (three) times daily as needed for sleep.  180 tablet  1  . carvedilol (COREG) 3.125 MG tablet Take 1 tablet (3.125 mg total) by mouth 2 (two) times daily with a meal.  90 tablet  1  . clopidogrel (PLAVIX) 75 MG tablet Take 1 tablet (75 mg total) by mouth daily.  90 tablet  3  . isosorbide mononitrate (IMDUR) 60 MG 24 hr tablet Take 60 mg by mouth 2 (two) times daily. 120mg -am, 60mg -pm      . nitroGLYCERIN (NITROSTAT) 0.4 MG SL tablet Place 1 tablet (0.4 mg total) under the tongue every 5 (five) minutes as needed for chest pain.  50 tablet  3  . spironolactone (ALDACTONE) 25 MG tablet 25 mg. Take 1/2 tablet daily       No  current facility-administered medications on file prior to visit.    Review of Systems Patient denies any headache, lightheadedness or dizziness.  Feels from a cardiac standpoint things are stable.  No increased shortness of breath, cough or congestion.  No nausea or vomiting.   No bowel change, such as diarrhea, constipation, BRBPR or melana.  States her bowels are doing well.  No urine change.  Feels she is handling stress well.  Has adjusted her diet.  Is s/p stent placement x 2.  Having problems with plantar fasciitis.  Injection helped some.  Not taking her cholesterol medication as outlined.       Objective:   Physical Exam  Filed Vitals:   02/14/13 1011  BP: 110/80  Pulse: 68  Temp: 98.3 F (36.8 C)   Blood pressure recheck:  24/6  63 year old female in no acute distress.   HEENT:  Nares- clear.  Oropharynx - without lesions. NECK:  Supple.  Nontender.    HEART:  Appears to be regular. LUNGS:  No crackles or wheezing audible.  Respirations even and unlabored.  RADIAL PULSE:  Equal bilaterally.  ABDOMEN:  Soft.  Non tender.    EXTREMITIES:  No increased edema present.  No increased erythema or warmth.  Assessment & Plan:  VASCULAR.  S/p stent x 2 - left leg.  Had flow checked s/p procedure.  States flow ok.  Following with Dr Lucky Cowboy.     INCREASED PSYCHOSOCIAL STRESSORS.  Feels she is handling things relatively well.  Does not feel she needs any further intervention at this time.  Uses Xanax.   HEALTH MAINTENANCE.  Physical 07/05/12.  Declines mammogram.  Will notify me when agreeable for colonoscopy.

## 2013-02-14 NOTE — Progress Notes (Signed)
Pre-visit discussion using our clinic review tool. No additional management support is needed unless otherwise documented below in the visit note.  

## 2013-02-15 ENCOUNTER — Telehealth: Payer: Self-pay | Admitting: *Deleted

## 2013-02-15 ENCOUNTER — Other Ambulatory Visit: Payer: Self-pay | Admitting: Internal Medicine

## 2013-02-15 DIAGNOSIS — I2510 Atherosclerotic heart disease of native coronary artery without angina pectoris: Secondary | ICD-10-CM

## 2013-02-15 NOTE — Telephone Encounter (Signed)
Added medication to med list.

## 2013-02-15 NOTE — Progress Notes (Signed)
Order placed for f/u met b.  

## 2013-02-19 ENCOUNTER — Encounter: Payer: Self-pay | Admitting: Internal Medicine

## 2013-02-19 NOTE — Assessment & Plan Note (Signed)
She feels symptoms relatively stable now.  Continues follow up with Dr Megan Salon.  Continue risk factor modification.  Continue current meds.  On increased lasix now.  Taking bid.  Check metabolic panel.

## 2013-02-19 NOTE — Assessment & Plan Note (Signed)
Blood pressure has been doing well. Same meds.  Follow metabolic panel.    

## 2013-02-19 NOTE — Assessment & Plan Note (Signed)
Continue to follow up with vascular surgery.  S/p stent x 2 recently performed by Dr Naverro.  Had flow checked s/p procedure.  States flow ok.  Plans to f/u with Dr Dew.     

## 2013-02-19 NOTE — Assessment & Plan Note (Signed)
Back on cholesterol medication.  Taking pravastatin.  Tolerating.  Follow lipid panel and liver function.    

## 2013-02-19 NOTE — Assessment & Plan Note (Signed)
Better now on increased lasix.  Check metabolic panel.  Keep f/u with Dr Megan Salon next week.

## 2013-02-21 ENCOUNTER — Other Ambulatory Visit: Payer: BC Managed Care – PPO

## 2013-03-01 ENCOUNTER — Encounter: Payer: Self-pay | Admitting: Internal Medicine

## 2013-03-01 ENCOUNTER — Other Ambulatory Visit (INDEPENDENT_AMBULATORY_CARE_PROVIDER_SITE_OTHER): Payer: BC Managed Care – PPO

## 2013-03-01 DIAGNOSIS — E7800 Pure hypercholesterolemia, unspecified: Secondary | ICD-10-CM

## 2013-03-01 DIAGNOSIS — I2510 Atherosclerotic heart disease of native coronary artery without angina pectoris: Secondary | ICD-10-CM

## 2013-03-01 LAB — LIPID PANEL
CHOL/HDL RATIO: 5
CHOLESTEROL: 200 mg/dL (ref 0–200)
HDL: 42.4 mg/dL (ref >39.00)
Triglycerides: 239 mg/dL — ABNORMAL HIGH (ref 0.0–149.0)
VLDL: 47.8 mg/dL — AB (ref 0.0–40.0)

## 2013-03-01 LAB — BASIC METABOLIC PANEL
BUN: 11 mg/dL (ref 6–23)
CHLORIDE: 103 meq/L (ref 96–112)
CO2: 29 mEq/L (ref 19–32)
CREATININE: 1.1 mg/dL (ref 0.4–1.2)
Calcium: 9.6 mg/dL (ref 8.4–10.5)
GFR: 55.14 mL/min — AB (ref >60.00)
Glucose, Bld: 103 mg/dL — ABNORMAL HIGH (ref 70–99)
Potassium: 4.1 mEq/L (ref 3.5–5.1)
Sodium: 141 mEq/L (ref 135–145)

## 2013-03-01 LAB — HEPATIC FUNCTION PANEL
ALBUMIN: 4.2 g/dL (ref 3.5–5.2)
ALT: 21 U/L (ref 0–35)
AST: 18 U/L (ref 0–37)
Alkaline Phosphatase: 58 U/L (ref 39–117)
BILIRUBIN TOTAL: 0.7 mg/dL (ref 0.3–1.2)
Bilirubin, Direct: 0 mg/dL (ref 0.0–0.3)
Total Protein: 7.1 g/dL (ref 6.0–8.3)

## 2013-03-01 LAB — LDL CHOLESTEROL, DIRECT: Direct LDL: 125.8 mg/dL

## 2013-03-01 LAB — TSH: TSH: 1.25 u[IU]/mL (ref 0.35–5.50)

## 2013-03-03 NOTE — Telephone Encounter (Signed)
Mailed unread message to pt  

## 2013-05-09 ENCOUNTER — Encounter: Payer: Self-pay | Admitting: Adult Health

## 2013-05-09 ENCOUNTER — Ambulatory Visit (INDEPENDENT_AMBULATORY_CARE_PROVIDER_SITE_OTHER): Payer: BC Managed Care – PPO | Admitting: Adult Health

## 2013-05-09 VITALS — BP 110/72 | HR 75 | Temp 98.2°F | Resp 14 | Wt 160.0 lb

## 2013-05-09 DIAGNOSIS — Z23 Encounter for immunization: Secondary | ICD-10-CM

## 2013-05-09 DIAGNOSIS — S41119A Laceration without foreign body of unspecified upper arm, initial encounter: Secondary | ICD-10-CM

## 2013-05-09 DIAGNOSIS — S41109A Unspecified open wound of unspecified upper arm, initial encounter: Secondary | ICD-10-CM

## 2013-05-09 MED ORDER — CEPHALEXIN 500 MG PO CAPS: 500.0000 mg | ORAL_CAPSULE | Freq: Two times a day (BID) | ORAL | Status: DC

## 2013-05-09 NOTE — Progress Notes (Signed)
Pre visit review using our clinic review tool, if applicable. No additional management support is needed unless otherwise documented below in the visit note. 

## 2013-05-09 NOTE — Progress Notes (Signed)
[name], female   DOB: 1950/11/17, 63 y.o.   MRN: [id number]    Subjective:    [name], female    DOB: 1950-07-04, 63 y.o.   MRN: [id number]  HPI  Patient is a pleasant 63 year old female presents to clinic with a small cut on her left forearm. She was outside pruning in her garden and cut herself with her pruning shears. She reports washing the cut with soap and water, applying pressure. She is experiencing pain in her entire left forearm. She has been applying Neosporin to the cut. Does not remember when her last tetanus vaccine was.  Past Medical History  Diagnosis Date  . Hypertension   . Hypercholesteremia   . CAD (coronary artery disease) 2003    s/p CABG x 5 (Dr Evelina Dun), MI- 2010  . PVD (peripheral vascular disease)     s/p intervention (left) - 2004, right (2006)  . Endometriosis     Current Outpatient Prescriptions on File Prior to Visit  Medication Sig Dispense Refill  . ALPRAZolam (XANAX) 0.5 MG tablet Take 1 tablet (0.5 mg total) by mouth 3 (three) times daily as needed for sleep.  180 tablet  1  . carvedilol (COREG) 3.125 MG tablet Take 1 tablet (3.125 mg total) by mouth 2 (two) times daily with a meal.  90 tablet  1  . clopidogrel (PLAVIX) 75 MG tablet Take 1 tablet (75 mg total) by mouth daily.  90 tablet  3  . furosemide (LASIX) 40 MG tablet Take 40 mg by mouth 2 (two) times daily.      . isosorbide mononitrate (IMDUR) 60 MG 24 hr tablet Take 60 mg by mouth 2 (two) times daily. 120mg -am, 60mg -pm      . losartan (COZAAR) 25 MG tablet Take 12.5 mg by mouth daily.      . nitroGLYCERIN (NITROSTAT) 0.4 MG SL tablet Place 1 tablet (0.4 mg total) under the tongue every 5 (five) minutes as needed for chest pain.  50 tablet  3  . potassium chloride (K-DUR) 10 MEQ tablet Take 10 mEq by mouth 2 (two) times daily.       . pravastatin (PRAVACHOL) 40 MG tablet Take 40 mg by mouth daily.      Marland Kitchen spironolactone (ALDACTONE) 25 MG tablet 25 mg. Take 1/2  tablet daily       No current facility-administered medications on file prior to visit.     Review of Systems  Constitutional: Negative for fever.  Musculoskeletal:       Pain with opening and closing her left fist. Pulls on the cut.  Skin:       Cut on left forearm. Redness around the cut. Pain in arm  Neurological: Negative for numbness.  Psychiatric/Behavioral: Negative.   All other systems reviewed and are negative.       Objective:  BP 110/72  Pulse 75  Temp(Src) 98.2 F (36.8 C) (Oral)  Resp 14  Wt 160 lb (72.576 kg)  SpO2 96%   Physical Exam  Constitutional: She is oriented to person, place, and time. She appears well-developed and well-nourished. No distress.  Cardiovascular: Normal rate and regular rhythm.   Pulmonary/Chest: Effort normal. No respiratory distress.  Musculoskeletal:  Able to make fist. Good movement of all fingers on left hand  Neurological: She is alert and oriented to person, place, and time.  Skin:  Laceration to left inner forearm. Erythema surrounding the laceration. Some edema noted as well. Skin is well approximated.  No drainage from laceration.  Psychiatric: She has a normal mood and affect. Her behavior is normal. Judgment and thought content normal.      Assessment & Plan:   1. Laceration of arm Cut her inner forearm with pruning shears while working in the yard. Tetanus booster given. Start Keflex 500 mg bid x 7 days. Continue neosporin to the area. Keep clean and dry. RTC if worsening symptoms.

## 2013-06-05 ENCOUNTER — Telehealth: Payer: Self-pay | Admitting: Internal Medicine

## 2013-06-05 NOTE — Telephone Encounter (Signed)
Spoke with patient & she states that she is on her way home from [place]. I notified her that she would need to be seen. Offered patient an appt tomorrow with Raquel at 3:15. Pt accepted appointment.

## 2013-06-05 NOTE — Telephone Encounter (Signed)
Pt left vm.  Pt states she is out of town and needs antibiotic called in for sinus infection.  No further information given.

## 2013-06-06 ENCOUNTER — Ambulatory Visit: Payer: BC Managed Care – PPO | Admitting: Adult Health

## 2013-07-12 ENCOUNTER — Encounter: Payer: Self-pay | Admitting: Internal Medicine

## 2013-07-12 ENCOUNTER — Ambulatory Visit (INDEPENDENT_AMBULATORY_CARE_PROVIDER_SITE_OTHER): Payer: BC Managed Care – PPO | Admitting: Internal Medicine

## 2013-07-12 VITALS — BP 100/70 | HR 65 | Temp 98.2°F | Ht 66.5 in | Wt 157.2 lb

## 2013-07-12 DIAGNOSIS — I2510 Atherosclerotic heart disease of native coronary artery without angina pectoris: Secondary | ICD-10-CM

## 2013-07-12 DIAGNOSIS — E7800 Pure hypercholesterolemia, unspecified: Secondary | ICD-10-CM

## 2013-07-12 DIAGNOSIS — I739 Peripheral vascular disease, unspecified: Secondary | ICD-10-CM

## 2013-07-12 DIAGNOSIS — M722 Plantar fascial fibromatosis: Secondary | ICD-10-CM

## 2013-07-12 DIAGNOSIS — I10 Essential (primary) hypertension: Secondary | ICD-10-CM

## 2013-07-12 LAB — CBC WITH DIFFERENTIAL/PLATELET
Basophils Absolute: 0 10*3/uL (ref 0.0–0.1)
Basophils Relative: 0.7 % (ref 0.0–3.0)
Eosinophils Absolute: 0.1 10*3/uL (ref 0.0–0.7)
Eosinophils Relative: 2.1 % (ref 0.0–5.0)
HEMATOCRIT: 38 % (ref 36.0–46.0)
HEMOGLOBIN: 12.6 g/dL (ref 12.0–15.0)
Lymphocytes Relative: 34.2 % (ref 12.0–46.0)
Lymphs Abs: 1.7 10*3/uL (ref 0.7–4.0)
MCHC: 33.2 g/dL (ref 30.0–36.0)
MCV: 91.4 fl (ref 78.0–100.0)
MONOS PCT: 6.5 % (ref 3.0–12.0)
Monocytes Absolute: 0.3 10*3/uL (ref 0.1–1.0)
NEUTROS ABS: 2.7 10*3/uL (ref 1.4–7.7)
Neutrophils Relative %: 56.5 % (ref 43.0–77.0)
PLATELETS: 192 10*3/uL (ref 150.0–400.0)
RBC: 4.15 Mil/uL (ref 3.87–5.11)
RDW: 15.1 % (ref 11.5–15.5)
WBC: 4.8 10*3/uL (ref 4.0–10.5)

## 2013-07-12 LAB — HEPATIC FUNCTION PANEL
ALBUMIN: 4.2 g/dL (ref 3.5–5.2)
ALT: 21 U/L (ref 0–35)
AST: 25 U/L (ref 0–37)
Alkaline Phosphatase: 51 U/L (ref 39–117)
Bilirubin, Direct: 0.1 mg/dL (ref 0.0–0.3)
Total Bilirubin: 0.7 mg/dL (ref 0.2–1.2)
Total Protein: 6.6 g/dL (ref 6.0–8.3)

## 2013-07-12 LAB — BASIC METABOLIC PANEL
BUN: 7 mg/dL (ref 6–23)
CO2: 25 mEq/L (ref 19–32)
Calcium: 9.5 mg/dL (ref 8.4–10.5)
Chloride: 106 mEq/L (ref 96–112)
Creatinine, Ser: 0.9 mg/dL (ref 0.4–1.2)
GFR: 67.24 mL/min (ref >60.00)
GLUCOSE: 95 mg/dL (ref 70–99)
POTASSIUM: 4.6 meq/L (ref 3.5–5.1)
Sodium: 140 mEq/L (ref 135–145)

## 2013-07-12 LAB — LIPID PANEL
CHOL/HDL RATIO: 4
Cholesterol: 183 mg/dL (ref 0–200)
HDL: 44 mg/dL (ref >39.00)
LDL Cholesterol: 107 mg/dL — ABNORMAL HIGH (ref 0–99)
NONHDL: 139
Triglycerides: 158 mg/dL — ABNORMAL HIGH (ref 0.0–149.0)
VLDL: 31.6 mg/dL (ref 0.0–40.0)

## 2013-07-12 MED ORDER — ALPRAZOLAM 0.5 MG PO TABS: 0.5000 mg | ORAL_TABLET | Freq: Two times a day (BID) | ORAL | Status: DC | PRN

## 2013-07-12 NOTE — Progress Notes (Signed)
Pre visit review using our clinic review tool, if applicable. No additional management support is needed unless otherwise documented below in the visit note. 

## 2013-07-12 NOTE — Assessment & Plan Note (Signed)
She feels symptoms relatively stable now.  Continues follow up with Dr Megan Salon.  Continue risk factor modification.  Continue current meds.

## 2013-07-12 NOTE — Assessment & Plan Note (Signed)
Blood pressure has been doing well. Same meds.  Follow metabolic panel.

## 2013-07-12 NOTE — Assessment & Plan Note (Signed)
Continue to follow up with vascular surgery.  S/p stent x 2 recently performed by Dr Carma Leaven.  Had flow checked s/p procedure.  States flow ok.  Plans to f/u with Dr Lucky Cowboy.

## 2013-07-12 NOTE — Assessment & Plan Note (Signed)
Sees Dr Vickki Muff.  S/p injection.  Helped some.  Has orthotics.  Desires no further intervention at this time.

## 2013-07-12 NOTE — Assessment & Plan Note (Signed)
Back on cholesterol medication.  Taking pravastatin.  Tolerating.  Follow lipid panel and liver function.

## 2013-07-12 NOTE — Progress Notes (Signed)
Subjective:    [name], female    DOB: 07-Dec-1950, 63 y.o.   MRN: [id number]  HPI 63 year old female with past history of CAD s/p CABG and massive MI, hypertension, hypercholesterolemia and peripheral vascular disease who comes in today for a scheduled follow up.  Was evaluated by Dr Ubaldo Glassing.  ECHO 02/06/13 revealed EF 30-35%, borderline concentric LVH, mild to moderated mitral valve regurgitation, mild to moderated aortic valve sclerosis without any stenosis, mild to moderate tricuspid regurgitation and moderately elevated pulmonary artery systolic pressure. She saw Dr Ubaldo Glassing while she was in [facility].  She is normally followed by Dr Megan Salon - cardiology in [place].  States she was recently evaluated by Dr Megan Salon.  States EF stable at 35%.  No changes made in medication.  Overall she feels things are stable.  Previously noticed some minimal ankle swelling.  This is better now.  Breathing stable.  Taking her pravastatin regularly now.  Trying to watch her diet.  Has been having some issues with plantar fasciitis.  Has orthotics.  Desires no further w/up at this time.  She states due f/u with Dr Lucky Cowboy.     Past Medical History  Diagnosis Date  . Hypertension   . Hypercholesteremia   . CAD (coronary artery disease) 2003    s/p CABG x 5 (Dr Evelina Dun), MI- 2010  . PVD (peripheral vascular disease)     s/p intervention (left) - 2004, right (2006)  . Endometriosis     Current Outpatient Prescriptions on File Prior to Visit  Medication Sig Dispense Refill  . carvedilol (COREG) 3.125 MG tablet Take 1 tablet (3.125 mg total) by mouth 2 (two) times daily with a meal.  90 tablet  1  . clopidogrel (PLAVIX) 75 MG tablet Take 1 tablet (75 mg total) by mouth daily.  90 tablet  3  . furosemide (LASIX) 40 MG tablet Take 40 mg by mouth 2 (two) times daily.      . isosorbide mononitrate (IMDUR) 60 MG 24 hr tablet Take 60 mg by mouth 2 (two) times daily. 120mg -am, 60mg -pm      . losartan (COZAAR) 25 MG  tablet Take 12.5 mg by mouth daily.      . nitroGLYCERIN (NITROSTAT) 0.4 MG SL tablet Place 1 tablet (0.4 mg total) under the tongue every 5 (five) minutes as needed for chest pain.  50 tablet  3  . potassium chloride (K-DUR) 10 MEQ tablet Take 10 mEq by mouth 2 (two) times daily.       . pravastatin (PRAVACHOL) 40 MG tablet Take 40 mg by mouth daily.      Marland Kitchen spironolactone (ALDACTONE) 25 MG tablet 25 mg. Take 1/2 tablet daily       No current facility-administered medications on file prior to visit.    Review of Systems Patient denies any headache, lightheadedness or dizziness.  Feels from a cardiac standpoint things are stable.  No increased shortness of breath, cough or congestion.  No nausea or vomiting.   No bowel change, such as diarrhea, constipation, BRBPR or melana.  Bowels stable.   No urine change.  Feels she is handling stress well.  Has adjusted her diet.  Is s/p stent placement x 2.  Having problems with plantar fasciitis.  Injection helped some.  Has orthotics.  See above.  Now taking her cholesterol medication regularly.         Objective:   Physical Exam  Filed Vitals:   07/12/13 0845  BP: 100/70  Pulse:   Temp:    Blood pressure recheck:  Left 110/68, right 102/32  63 year old female in no acute distress.   HEENT:  Nares- clear.  Oropharynx - without lesions. NECK:  Supple.  Nontender.    HEART:  Appears to be regular. LUNGS:  No crackles or wheezing audible.  Respirations even and unlabored.  RADIAL PULSE:  May be just slightly diminished on the left.    ABDOMEN:  Soft.  Non tender.    EXTREMITIES:  No increased edema present.  No increased erythema or warmth.                    Assessment & Plan:  VASCULAR.  S/p stent x 2 - left leg.  Had flow checked s/p procedure.  States flow ok.  Following with Dr Lucky Cowboy.   Overdue f/u.  She wants to call and schedule.   Will let me know if any problems.    INCREASED PSYCHOSOCIAL STRESSORS.  Feels she is handling things  relatively well.  Does not feel she needs any further intervention at this time.  Uses Xanax.   HEALTH MAINTENANCE.  Physical 07/05/12.  Declines mammogram.  Will notify me when agreeable for colonoscopy.  Schedule mammogram.    I spent 25 minutes with the patient and more than 50% of the time was spent in consultation regarding the above.

## 2013-07-13 ENCOUNTER — Encounter: Payer: Self-pay | Admitting: *Deleted

## 2013-07-13 ENCOUNTER — Encounter: Payer: Self-pay | Admitting: Internal Medicine

## 2013-07-17 NOTE — Telephone Encounter (Signed)
Unread mychart message mailed to patient 

## 2013-09-27 ENCOUNTER — Encounter: Payer: Self-pay | Admitting: Internal Medicine

## 2013-09-27 DIAGNOSIS — I739 Peripheral vascular disease, unspecified: Secondary | ICD-10-CM

## 2014-01-10 ENCOUNTER — Encounter: Payer: BC Managed Care – PPO | Admitting: Internal Medicine

## 2014-01-10 ENCOUNTER — Other Ambulatory Visit: Payer: BC Managed Care – PPO

## 2014-03-21 ENCOUNTER — Other Ambulatory Visit: Payer: BC Managed Care – PPO

## 2014-03-21 ENCOUNTER — Encounter: Payer: BC Managed Care – PPO | Admitting: Internal Medicine

## 2014-03-27 ENCOUNTER — Ambulatory Visit: Payer: Self-pay | Admitting: Family Medicine

## 2014-03-27 ENCOUNTER — Emergency Department: Payer: Self-pay | Admitting: Internal Medicine

## 2014-03-30 ENCOUNTER — Encounter: Payer: Self-pay | Admitting: Internal Medicine

## 2014-04-03 ENCOUNTER — Encounter: Payer: Self-pay | Admitting: Internal Medicine

## 2014-05-22 NOTE — Op Note (Signed)
PATIENT NAME:  Jennifer [name], Jennifer [name] MR#:  [id number] DATE OF BIRTH:  1950/10/19  DATE OF PROCEDURE:  01/04/2012  PREOPERATIVE DIAGNOSES:  1. Peripheral arterial disease with claudication, bilateral lower extremities.  2. Coronary artery disease.  POSTOPERATIVE DIAGNOSES:  1. Peripheral arterial disease with claudication, bilateral lower extremities.  2. Coronary artery disease.  PROCEDURES PERFORMED: 1. Ultrasound guidance for vascular access, right femoral artery.  2. Catheter placement into left peroneal artery from right femoral approach.  3. Aortogram and selective left lower extremity angiogram.  4. Percutaneous transluminal angioplasty of tibioperoneal trunk and proximal peroneal artery with 3 mm diameter angioplasty balloon.  5. Percutaneous transluminal angioplasty of long segment left SFA above-knee popliteal artery occlusion with 5 mm diameter angioplasty balloon.  6. Self-expanding stent placement to the above-knee popliteal artery for greater than 50% residual stenosis after angioplasty using a 6 mm diameter self-expanding stent.  7. Percutaneous transluminal angioplasty of proximal superficial femoral artery with 6 mm diameter angioplasty balloon. 8. StarClose closure device, right femoral artery.   SURGEON: Algernon Huxley, M.D.   ANESTHESIA: Local with moderate conscious sedation.   ESTIMATED BLOOD LOSS: Approximately 25 mL.  FLUOROSCOPY TIME: 11 minutes.   CONTRAST USED: 68 mL.   INDICATION FOR PROCEDURE: This is a 64 year old female with lifestyle limiting claudication, worse in the left lower extremity. Her ABI was markedly reduced, at less than 0.5, and she had significant impairment of her activities of daily living. For this reason, she desired intervention. The risks and benefits were discussed and informed consent was obtained.   DESCRIPTION OF PROCEDURE: The patient was brought to the vascular interventional radiology suite. Groins were shaved and prepped and a sterile  surgical field was created. The right femoral head was localized with fluoroscopy. The right femoral artery was visualized with ultrasound and found to be patent. It was accessed under direct ultrasound guidance without difficulty, with a Seldinger needle, and permanent image was recorded. A J-wire and 5 French sheath were placed. Pigtail catheter was placed in the aorta, at the L1 level, and AP aortogram was performed. This showed what appeared to be pretty normal perfusion through the renal vessels. There was a small abdominal aortic aneurysm present with some irregularity in the aorta without high-grade stenosis. The iliacs had mild disease that was not flow limiting. I then hooked the aortic bifurcation and advanced to the left femoral head and selective left lower extremity angiogram was then performed. This showed an occlusion of the superficial femoral artery t approximately 1 cm beyond its origin. This did not reconstitute until the popliteal artery distally and then she had one-vessel runoff of the peroneal artery that had stenosis at its origin, in the distal tibia peroneal trunk. The patient was given 4000 units of intravenous heparin for systemic anticoagulation and a 6 French high-flex Ansel sheath was placed over a Terumo Advantage wire. I was able to gain access to the superficial femoral artery and get into the occlusion with the Terumo Advantage wire and a Kumpe catheter. With the Kumpe catheter and Terumo Advantage wire, I was able to gain access to the true lumen distally, in the popliteal artery, and confirm intraluminal flow with the catheter distally. I then advanced into the peroneal artery and exchanged for an 0.018 wire, with the Kumpe catheter. I treated the peroneal lesion/tibioperoneal trunk lesion with a 3 mm diameter x 8 cm in length angioplasty balloon and completion angiogram following this showed this area now to be widely patent without significant residual  stenosis. I then treated  the superficial femoral artery and above-knee popliteal artery with a long 5 mm diameter angioplasty balloon, from the common femoral artery to the level of the knee. Multiple areas of wastes were taken which resolved. Following this, there was still significant stenosis, at the origin of the SFA. I treated this area with a 6 mm diameter angioplasty balloon. After this, there was still some stenosis present that appeared to be in the 50% range. This was in an area which was not appropriate for stent placement, so this was not treated. The flow was obviously significantly improved over the previous level of occlusion. There was a less than 50% stenosis, in the mid superficial femoral artery, that was not treated as it was not flow limiting. The distal re-entry point, in the above-knee popliteal artery, had a greater than 50% residual stenosis and for this lesion a 6 mm diameter x 12 cm in length self-expanding stent was used, ironed out with a 5 mm balloon with excellent angiographic completion result, and no significant residual stenosis after stent placement. At this point, I elected to terminate the procedure. The sheath was pulled back to the ipsilateral external iliac artery and oblique arteriogram was performed. StarClose closure device was deployed in the usual fashion with excellent hemostatic result. The patient tolerated the procedure well and was taken to the Recovery Room in stable condition. ____________________________ Algernon Huxley, MD jsd:slb D: 01/04/2012 09:22:42 ET T: 01/04/2012 10:11:20 ET JOB#: [id number]  cc: Algernon Huxley, MD, <Dictator> Einar Pheasant, MD Algernon Huxley MD ELECTRONICALLY SIGNED 01/07/2012 17:01

## 2014-05-23 ENCOUNTER — Telehealth: Payer: Self-pay | Admitting: *Deleted

## 2014-05-23 DIAGNOSIS — I10 Essential (primary) hypertension: Secondary | ICD-10-CM

## 2014-05-23 DIAGNOSIS — I2510 Atherosclerotic heart disease of native coronary artery without angina pectoris: Secondary | ICD-10-CM

## 2014-05-23 DIAGNOSIS — E7800 Pure hypercholesterolemia, unspecified: Secondary | ICD-10-CM

## 2014-05-23 NOTE — Telephone Encounter (Signed)
Labs and dx?  

## 2014-05-23 NOTE — Telephone Encounter (Signed)
Order placed for labs.

## 2014-05-24 ENCOUNTER — Encounter: Payer: Self-pay | Admitting: Internal Medicine

## 2014-05-24 ENCOUNTER — Ambulatory Visit (INDEPENDENT_AMBULATORY_CARE_PROVIDER_SITE_OTHER): Payer: 59 | Admitting: Internal Medicine

## 2014-05-24 ENCOUNTER — Other Ambulatory Visit (INDEPENDENT_AMBULATORY_CARE_PROVIDER_SITE_OTHER): Payer: 59

## 2014-05-24 VITALS — BP 101/65 | HR 70 | Temp 97.9°F | Ht 66.5 in | Wt 160.5 lb

## 2014-05-24 DIAGNOSIS — Z1211 Encounter for screening for malignant neoplasm of colon: Secondary | ICD-10-CM

## 2014-05-24 DIAGNOSIS — I10 Essential (primary) hypertension: Secondary | ICD-10-CM

## 2014-05-24 DIAGNOSIS — I2510 Atherosclerotic heart disease of native coronary artery without angina pectoris: Secondary | ICD-10-CM

## 2014-05-24 DIAGNOSIS — I739 Peripheral vascular disease, unspecified: Secondary | ICD-10-CM

## 2014-05-24 DIAGNOSIS — E7800 Pure hypercholesterolemia, unspecified: Secondary | ICD-10-CM

## 2014-05-24 DIAGNOSIS — Z0000 Encounter for general adult medical examination without abnormal findings: Secondary | ICD-10-CM

## 2014-05-24 LAB — BASIC METABOLIC PANEL
BUN: 9 mg/dL (ref 6–23)
CALCIUM: 9.7 mg/dL (ref 8.4–10.5)
CHLORIDE: 104 meq/L (ref 96–112)
CO2: 29 mEq/L (ref 19–32)
CREATININE: 0.99 mg/dL (ref 0.40–1.20)
GFR: 60.07 mL/min (ref >60.00)
Glucose, Bld: 114 mg/dL — ABNORMAL HIGH (ref 70–99)
Potassium: 4.2 mEq/L (ref 3.5–5.1)
Sodium: 140 mEq/L (ref 135–145)

## 2014-05-24 LAB — HEPATIC FUNCTION PANEL
ALT: 17 U/L (ref 0–35)
AST: 19 U/L (ref 0–37)
Albumin: 4.5 g/dL (ref 3.5–5.2)
Alkaline Phosphatase: 53 U/L (ref 39–117)
BILIRUBIN DIRECT: 0 mg/dL (ref 0.0–0.3)
BILIRUBIN TOTAL: 0.4 mg/dL (ref 0.2–1.2)
TOTAL PROTEIN: 7.3 g/dL (ref 6.0–8.3)

## 2014-05-24 LAB — LDL CHOLESTEROL, DIRECT: Direct LDL: 200 mg/dL

## 2014-05-24 LAB — LIPID PANEL
Cholesterol: 280 mg/dL — ABNORMAL HIGH (ref 0–200)
HDL: 43.9 mg/dL (ref >39.00)
NonHDL: 236.1
Total CHOL/HDL Ratio: 6
Triglycerides: 250 mg/dL — ABNORMAL HIGH (ref 0.0–149.0)
VLDL: 50 mg/dL — ABNORMAL HIGH (ref 0.0–40.0)

## 2014-05-24 LAB — TSH: TSH: 1.32 u[IU]/mL (ref 0.35–4.50)

## 2014-05-24 NOTE — Progress Notes (Signed)
Pre visit review using our clinic review tool, if applicable. No additional management support is needed unless otherwise documented below in the visit note. 

## 2014-05-25 ENCOUNTER — Encounter: Payer: Self-pay | Admitting: Internal Medicine

## 2014-05-25 DIAGNOSIS — Z0000 Encounter for general adult medical examination without abnormal findings: Secondary | ICD-10-CM | POA: Insufficient documentation

## 2014-05-25 NOTE — Assessment & Plan Note (Signed)
Just saw Dr Megan Salon.  States heart checked out fine.  Continue aggressive risk factor modification.

## 2014-05-25 NOTE — Assessment & Plan Note (Signed)
Blood pressure doing well.  Same medication regimen.  Follow pressures.  Follow metabolic panel.   

## 2014-05-25 NOTE — Progress Notes (Signed)
[name], female   DOB: 11/14/1950, 64 y.o.   MRN: [id number]   Subjective:    [name], female    DOB: 10/30/1950, 64 y.o.   MRN: [id number]  HPI  Patient here for her physical exam.  States recently had right hand cellulitis after cat bite.  Had to have rabies shots.  Better now.  jsut saw her cardiologist (Dr Megan Salon).  States heart checked out ok.  She is off her cholesterol medication and is taking fish oil and krill oil.  Breathing stable.  Bowels stable.  Handling stress well.    Past Medical History  Diagnosis Date  . Hypertension   . Hypercholesteremia   . CAD (coronary artery disease) 2003    s/p CABG x 5 (Dr Evelina Dun), MI- 2010  . PVD (peripheral vascular disease)     s/p intervention (left) - 2004, right (2006)  . Endometriosis     Outpatient Encounter Prescriptions as of 05/24/2014  Medication Sig  . ALPRAZolam (XANAX) 0.5 MG tablet Take 1 tablet (0.5 mg total) by mouth 2 (two) times daily as needed for sleep.  . carvedilol (COREG) 3.125 MG tablet Take 1 tablet (3.125 mg total) by mouth 2 (two) times daily with a meal.  . clopidogrel (PLAVIX) 75 MG tablet Take 1 tablet (75 mg total) by mouth daily.  . furosemide (LASIX) 40 MG tablet Take 40 mg by mouth 2 (two) times daily.  . isosorbide mononitrate (IMDUR) 60 MG 24 hr tablet Take 60 mg by mouth 2 (two) times daily. 120mg -am, 60mg -pm  . KRILL OIL PO Take by mouth daily.  Marland Kitchen losartan (COZAAR) 25 MG tablet Take 12.5 mg by mouth daily.  . nitroGLYCERIN (NITROSTAT) 0.4 MG SL tablet Place 1 tablet (0.4 mg total) under the tongue every 5 (five) minutes as needed for chest pain.  Marland Kitchen OVER THE COUNTER MEDICATION Omega XL  . potassium chloride (K-DUR) 10 MEQ tablet Take 10 mEq by mouth 2 (two) times daily.   Marland Kitchen spironolactone (ALDACTONE) 25 MG tablet 25 mg. Take 1/2 tablet daily  . TURMERIC PO Take by mouth daily.  . [DISCONTINUED] pravastatin (PRAVACHOL) 40 MG tablet Take 40 mg by mouth  daily.    Review of Systems  Constitutional: Negative for appetite change and unexpected weight change.  HENT: Negative for congestion and sinus pressure.   Eyes: Negative for pain and visual disturbance.  Respiratory: Negative for cough, chest tightness and shortness of breath.   Cardiovascular: Negative for chest pain, palpitations and leg swelling.  Gastrointestinal: Negative for nausea, vomiting, abdominal pain and diarrhea.  Genitourinary: Negative for dysuria and difficulty urinating.  Musculoskeletal: Negative for back pain and joint swelling.  Skin: Negative for color change and rash.  Neurological: Negative for dizziness, light-headedness and headaches.  Hematological: Negative for adenopathy. Does not bruise/bleed easily.  Psychiatric/Behavioral: Negative for dysphoric mood and agitation.       Objective:     Blood pressure recheck:  128/78  Physical Exam  Constitutional: She is oriented to person, place, and time. She appears well-developed and well-nourished. No distress.  HENT:  Nose: Nose normal.  Mouth/Throat: Oropharynx is clear and moist.  Eyes: Right eye exhibits no discharge. Left eye exhibits no discharge. No scleral icterus.  Neck: Neck supple. No thyromegaly present.  Cardiovascular: Normal rate and regular rhythm.   Pulmonary/Chest: Breath sounds normal. No accessory muscle usage. No tachypnea. No respiratory distress. She has no decreased breath sounds. She has no wheezes. She  has no rhonchi. Right breast exhibits no inverted nipple, no mass, no nipple discharge and no tenderness (no axillary adenopathy). Left breast exhibits no inverted nipple, no mass, no nipple discharge and no tenderness (no axilarry adenopathy).  Abdominal: Soft. Bowel sounds are normal. There is no tenderness.  Musculoskeletal: She exhibits no edema or tenderness.  Lymphadenopathy:    She has no cervical adenopathy.  Neurological: She is alert and oriented to person, place, and time.    Skin: Skin is warm. No rash noted.  Psychiatric: She has a normal mood and affect. Her behavior is normal.    BP 101/65 mmHg  Pulse 70  Temp(Src) 97.9 F (36.6 C) (Oral)  Ht 5' 6.5" (1.689 m)  Wt 160 lb 8 oz (72.802 kg)  BMI 25.52 kg/m2  SpO2 97% Wt Readings from Last 3 Encounters:  05/24/14 160 lb 8 oz (72.802 kg)  07/12/13 157 lb 4 oz (71.328 kg)  05/09/13 160 lb (72.576 kg)     Lab Results  Component Value Date   WBC 4.8 07/12/2013   HGB 12.6 07/12/2013   HCT 38.0 07/12/2013   PLT 192.0 07/12/2013   GLUCOSE 114* 05/24/2014   CHOL 280* 05/24/2014   TRIG 250.0* 05/24/2014   HDL 43.90 05/24/2014   LDLDIRECT 200.0 05/24/2014   LDLCALC 107* 07/12/2013   ALT 17 05/24/2014   AST 19 05/24/2014   NA 140 05/24/2014   K 4.2 05/24/2014   CL 104 05/24/2014   CREATININE 0.99 05/24/2014   BUN 9 05/24/2014   CO2 29 05/24/2014   TSH 1.32 05/24/2014       Assessment & Plan:   Problem List Items Addressed This Visit    CAD (coronary artery disease)    Just saw Dr Megan Salon.  States heart checked out fine.  Continue aggressive risk factor modification.       Health care maintenance    Physical 05/24/14.  Declines mammogram.  Discussed the need for colonoscopy.  She declines at this time.  IFOB.        Hypercholesteremia    Low cholesterol diet and exercise.  Off her cholesterol medication.  Discussed today.  Check lipid panel and liver function tests.       Hypertension    Blood pressure doing well.  Same medication regimen.  Follow pressures.  Follow metabolic panel.       Peripheral vascular disease    Sees Dr Lucky Cowboy.  See overview.  Just reevaluated.  Felt legs stable.  Recommended f/u in 6 months.        Other Visit Diagnoses    Colon cancer screening    -  Primary    Relevant Orders    Fecal occult blood, imunochemical      I spent 25 minutes with the patient and more than 50% of the time was spent in consultation regarding the above.     Einar Pheasant,  MD

## 2014-05-25 NOTE — Assessment & Plan Note (Signed)
Low cholesterol diet and exercise.  Off her cholesterol medication.  Discussed today.  Check lipid panel and liver function tests.

## 2014-05-25 NOTE — Assessment & Plan Note (Signed)
Physical 05/24/14.  Declines mammogram.  Discussed the need for colonoscopy.  She declines at this time.  IFOB.

## 2014-05-25 NOTE — Assessment & Plan Note (Signed)
Sees Dr Lucky Cowboy.  See overview.  Just reevaluated.  Felt legs stable.  Recommended f/u in 6 months.

## 2014-05-26 NOTE — Consult Note (Signed)
Brief Consult Note: Diagnosis: uri/nstemi.   Patient was seen by consultant.   Consult note dictated.   Recommend further assessment or treatment.   Orders entered.   Discussed with Attending MD.   Comments: 64 yo female with history of cad s/p cabg in 2003; s/p pci in 2010 per Alfonso Patten in [place] who was admitted with several days of worsening cough, congestion and shortness of breath. She was hypoxic on presentation and required B iPAP initially. Currently breathing better on 2 liters of oxygen. Has ruled in for nstemi with serum tropinin of 6. Etiology is acute coronary event vs demand ischemia in patient with cad. Echo pending. WOuld conitnue with asa, plavix, carvedilol, nitrates, heparin. Have placed orders for cardiac cath in am pending course and pateiints preference regarding [facility] Full noe to follow.  Electronic Signatures: Teodoro Spray (MD)  (Signed 01-Jan-15 09:53)  Authored: Brief Consult Note   Last Updated: 01-Jan-15 09:53 by Teodoro Spray (MD)

## 2014-05-26 NOTE — Discharge Summary (Signed)
PATIENT NAME:  Jennifer [name], Jennifer [name] MR#:  [id number] DATE OF BIRTH:  [birth year]  DATE OF ADMISSION:  02/02/2013 DATE OF DISCHARGE:  02/06/2013  PRIMARY CARE PHYSICIAN:  Dr. Nicki Reaper   CONSULTATION: Dr. Ubaldo Glassing, cardiology.    DISCHARGE DIAGNOSES: 1.  Acute respiratory failure.  2.  Acute systolic congestive heart failure with ejection fraction 30% to 35%.  3.  Non-STEMI 4.  Coronary artery disease.   CONDITION: Stable.   CODE STATUS: Full code.   HOME MEDICATIONS:   Please refer to the [facility] physician discharge instruction medication reconciliation list.   DIET: Low-sodium, low-fat, low-cholesterol diet.   ACTIVITY: As tolerated.   FOLLOW-UP CARE: Follow with PCP within 1 to 2 weeks. Follow up with the patient's cardiologist in [place] 1 to 2 weeks,  follow-up BNP with PCP or cardiology.   REASON FOR ADMISSION: Shortness of breath.   HOSPITAL COURSE: The patient is a 64 year old Caucasian female with a history of coronary artery disease, status post five-vessel CABG, myocardial infarction, hypertension, cardiac ischemia, cardiomyopathy, who presented to the ED with shortness of breath. She was found to have low oxygen saturation at 70s in the ED. She was lethargic, diaphoretic at the time of arrival to the ED. She required BiPAP therapy to maintain oxygen saturation She had marked tachycardia. For detailed history and physical examination, please refer to the admission note dictated by Dr. Lavetta Nielsen. Laboratory data on admission date showed  sodium 138, potassium 3.8, chloride 104, BUN 6, creatinine 1.35,  BNP 4007, troponin 0.05, hemoglobin 13.4. A chest x-ray showed diffuse bilateral interstitial infiltrate. EKG showed sinus tachycardia of 112.  1.  Acute respiratory failure requiring BiPAP therapy after admission, which is possibly a headache due to acute systolic congestive heart failure. After the patient's admission, the patient has been treated with Lasix 40 mg IV every 8 hours. The patient has  no was weaned off BiPAP, on oxygen by nasal cannula. The patient symptoms have much improved after Lasix treatment.  On physical examination, the patient's lung sounds are clear today. The patient is off oxygen today without any desaturation. The patient was initially was treated with Levaquin due to of  pneumonia, but the patient has no fever or chills. White count is normal. Levaquin is discontinued.  2.  Non-ST segment elevation myocardial infarction. The patient's troponin level increased from 0.05 to 6.3 and 7.3 after admission. The patient was placed on heparin drip and also the patient has been treated with Plavix, statin, beta blocker, lisinopril. Dr. Ubaldo Glassing evaluated the patient and suggested that patient may need a cardiac cath after being treated with congestive heart failure. If the patient is stable after being treated with Lasix for congestive heart failure. The patient's symptoms is much improved. However, the patient does not want to have a cardiac catheter at this time. She wants to go to see cardiologist in [place]. I discussed with Dr. Ubaldo Glassing.  Dr. Ubaldo Glassing agreed to discharge the patient today and follow up with the patient's cardiologist in [place].  Dr. Ubaldo Glassing suggested to continue current medications and discontinue heparin drip. The patient's vital signs are stable. The patient's vital signs are stable.  Physical examination is unremarkable.  The patient will be discharged to home today. Discussed the patient's discharge plan with the patient, Dr. Ubaldo Glassing, nurse and case manager.   TIME SPENT: About 39 minutes   ____________________________ Demetrios Loll, MD qc:cc D: 02/06/2013 15:24:34 ET T: 02/06/2013 19:28:14 ET JOB#: [id number]  cc: Demetrios Loll, MD, <Dictator> [facility]  MD ELECTRONICALLY SIGNED 02/07/2013 14:34

## 2014-05-26 NOTE — Consult Note (Signed)
   Present Illness 64 yo female with history of cad s/p cabg in 2003; s/p pci in 2010 per Alfonso Patten in [place] who was admitted with several days of worsening cough, congestion and shortness of breath. She was hypoxic on presentation and required B iPAP initially. Currently breathing better on 2 liters of oxygen. Has ruled in for nstemi with serum tropinin of 7.3. Currently is pain free.  Denies chest pain. She has been compliant with her meds.   Physical Exam:  GEN no acute distress   HEENT PERRL, moist oral mucosa   NECK supple  No masses   RESP normal resp effort  rhonchi   CARD Regular rate and rhythm  No murmur   ABD denies tenderness  normal BS   LYMPH negative neck   EXTR negative cyanosis/clubbing, negative edema   SKIN normal to palpation   NEURO cranial nerves intact, motor/sensory function intact   PSYCH A+O to time, place, person   Review of Systems:  Subjective/Chief Complaint sob and chest tightness   General: Fatigue   Skin: No Complaints   ENT: No Complaints   Eyes: No Complaints   Neck: No Complaints   Respiratory: Frequent cough  Short of breath   Cardiovascular: Tightness   Gastrointestinal: No Complaints   Genitourinary: No Complaints   Vascular: No Complaints   Musculoskeletal: No Complaints   Neurologic: No Complaints   Hematologic: No Complaints   Endocrine: No Complaints   Psychiatric: No Complaints   Review of Systems: All other systems were reviewed and found to be negative   Medications/Allergies Reviewed Medications/Allergies reviewed   EKG:  EKG NSR   Abnormal NSSTTW changes    No Known Allergies:    Impression 64 yo female with history of cad s/p cabg in 2003; s/p pci in 2010 per Alfonso Patten in [place] who was admitted with several days of worsening cough, congestion and shortness of breath. She was hypoxic on presentation and required B iPAP initially. Currently breathing better on 2 liters of oxygen. Has  ruled in for nstemi with serum tropinin of 7.3. Etiology is acute coronary event vs demand ischemia in patient with cad. Echo pending. WOuld conitnue with asa, plavix, carvedilol, nitrates, heparin. Willl proceed with cardiac cath in am to evaluate anatomy   Plan 1. Continue with current meds including asa, plavix, carvedilol, statin and heparin 2. Will attempt to get surgical and cath records from [place] 3. Careful diuresis 4. Cath in am 5. Further recs after cath   Electronic Signatures: Teodoro Spray (MD)  (Signed 01-Jan-15 17:55)  Authored: General Aspect/Present Illness, History and Physical Exam, Review of System, EKG , Allergies, Impression/Plan   Last Updated: 01-Jan-15 17:55 by Teodoro Spray (MD)

## 2014-05-26 NOTE — H&P (Signed)
PATIENT NAME:  Jennifer [name], Jennifer [name] MR#:  [id number] DATE OF BIRTH:  [birth year]  DATE OF ADMISSION:  02/02/2013  REFERRING PHYSICIAN:  Dr. Dahlia Client.   PRIMARY CARE PHYSICIAN: Dr. Einar Pheasant.   CHIEF COMPLAINT: Shortness of breath.  HISTORY OF PRESENT ILLNESS:  A 64 year old Caucasian female with past medical history of coronary artery disease status post five vessel CABG back in 2003 followed by subsequent myocardial infarction in 2010, hypertension, ischemic cardiomyopathy of unknown ejection fraction, who is presenting with shortness of breath. She describes a one week duration of cough which she describes productive yellow sputum, however, no fevers, chills or chest pain. Positive sick contacts. She is presenting; however, today with acute onset of shortness of breath at home stating that she simply could not catch her breath. She described it as " I cannot breathe". On EMS arrival,  she is found to be hypoxemic with a SAO2 in the 70s. She is also noted to be lethargic and diaphoretic at that time upon arrival to the Emergency Department. She required BiPAP therapy to maintain O2 saturations, as well as she is markedly tachypneic, which has improved. Additional history given by her husband at bedside, noted no recent edema, orthopnea or PND. The patient, herself, denies any palpitations or any symptoms aside from what was already mentioned. Currently, still complains of shortness of breath, though improves. She does follow with cardiology. However, not a local doctor.  REVIEW OF SYSTEMS:   CONSTITUTIONAL: Denies fever, fatigue, weakness.  EYES: Denied blurred vision, double vision, eye pain.  EARS, NOSE, THROAT: Denies tinnitus, ear pain, hearing loss.  RESPIRATORY: Positive for cough, shortness of breath as described above. Denies any wheeze.  CARDIOVASCULAR: Denies chest pain, palpitations, edema, orthopnea, PND.  GASTROINTESTINAL: Denies nausea, vomiting, diarrhea, abdominal pain.   GENITOURINARY: Denies dysuria, hematuria.  ENDOCRINE: Denies nocturia or thyroid problems.  HEMATOLOGIC AND LYMPHATIC: Denies easy bruising or bleeding.   SKIN: Denies rash or lesion.  MUSCULOSKELETAL: Denies pain in neck, back, shoulder, knees, hips, arthritic symptoms.  NEUROLOGIC: Denies paralysis, paresthesias.  PSYCHIATRIC: Denies any anxiety or depressive symptoms.  Otherwise, full review of systems performed by me is negative.   PAST MEDICAL HISTORY: Coronary artery disease, status post five vessel CABG in 2003 followed by myocardial infarction in 2097m ischemic cardiomyopathy with unknown ejection fraction, hypertension as well as peripheral vascular disease.   SOCIAL HISTORY: Denies alcohol, tobacco or drug usage. Does have a remote history of tobacco usage, not since about four years ago.   FAMILY HISTORY: Denies any known congestive heart failure.   ALLERGIES: No known drug allergies.   HOME MEDICATIONS: Alprazolam 0.5 mg p.o. 3 times daily as needed for anxiety, Coreg 3.125 mg p.o. b.i.d., fish oil 2000 mg p.o. daily, Lasix 40 mg p.o. daily, garlic 6203 mg p.o. daily, Imdur 120 mg extended release in the morning, 60 mg at bedtime, losartan 5 mg p.o. at bedtime, Oil of Oregano 960 mg p.o. daily, Plavix 75 mg p.o. daily, pravastatin 80 mg p.o. at bedtime, red yeast rice 1200 mg p.o. daily, spironolactone 12.5 mg p.o. daily.   PHYSICAL EXAMINATION: VITAL SIGNS: Temperature 96.4, heart rate 112, respirations 22, blood pressure 115/63, saturating 98% on BiPAP therapy, weight 81.6 kg, BMI 26.6.  GENERAL: Well-nourished, well-developed, Caucasian female, currently in minimal to moderate distress secondary to respiratory status.  HEAD: Normocephalic, atraumatic.  EYES: Pupils equal, round, reactive to light. Extraocular muscles intact.  No scleral icterus.  MOUTH: Moist mucous membranes. Dentition intact. No abscess noted.  EAR, NOSE, THROAT: Throat clear without exudate.  No external  lesions.  NECK: Supple. No thyromegaly. No nodules. No JVD.  PULMONARY: Diffuse coarse breath sounds with scattered rhonchi, markedly tachypnea, currently on BiPAP therapy with good respiratory effort.  CHEST: Nontender to palpation.  CARDIOVASCULAR: S1, S2, tachycardic, though difficult to auscultate over coarse breath sounds. No edema. Pedal pulses 2+ bilaterally.   GASTROINTESTINAL: Soft, nontender, nondistended. No masses. Positive bowel sounds. No hepatosplenomegaly.  MUSCULOSKELETAL: No swelling, clubbing or edema. Range of motion full in all extremities.  NEUROLOGIC: Cranial nerves II through XII intact. No gross motor deficits. Sensation intact. Reflexes intact.  SKIN: No ulcerations, lesions, rash or cyanosis. Skin warm, dry. Turgor is intact.  PSYCHIATRIC: Mood and affect within normal limits. The patient is awake, alert and oriented x 3. Insight and judgment intact.   LABORATORY DATA: Sodium 138, potassium 3.8, chloride 104, bicarb 21, BUN 6, creatinine 1.35, BNP 4007,  glucose 362, troponin I 0.05. WBC 10.6, hemoglobin 13.4, platelets 258. Urinalysis negative for evidence of infection. ABG 7.11, 48, 207, 15.2, lactic acid of 7. Chest x-ray revealing sternotomy wires as well as diffuse bilateral interstitial infiltrates EKG performed revealing sinus tachycardia, heart rate of 112 with occasional PVCs.   ASSESSMENT AND PLAN: A 64 year old female with history of coronary artery disease status post coronary artery bypass graft with five vessels with ischemic cardiomyopathy of unknown ejection fraction presenting with shortness of breath.  1.  Acute respiratory failure, requiring BiPAP therapy, most consistent with congestive heart failure exacerbation. She was given Lasix and BiPAP therapy. We will wean BiPAP as tolerated. After Lasix, she was diuresed 0.5 liters thus far in the Emergency Department. Given history of cough, unable to clearly seen pneumonias, we will add Levaquin coverage for  possible pneumonia, for congestive heart failure. We will consult cardiology. Continue diuresis. Trend cardiac enzymes. Check a transthoracic echocardiogram.  2.  Coronary artery disease status post coronary artery bypass graft with unknown ejection fraction. Continue Imdur, statin, Plavix and Coreg.  3.  Acute kidney injury, likely are related to poor cardiac output. After diuresis, suspect cardiac output will improve. We will avoid IV fluids given #1, we will follow renal function.  4.  Lactic acidosis with improved cardiac output. We will recheck.  5.  Deep venous prophylaxis with heparin subcutaneous.  6.  CODE STATUS: The patient is full code.   Critical care time spent: 55 minutes.     ____________________________ Aaron Mose. Zerah Hilyer, MD dkh:NTS D: 02/02/2013 04:17:30 ET T: 02/02/2013 04:46:56 ET JOB#: [id number]  cc: Aaron Mose. Lache Dagher, MD, <Dictator> [facility] Postlethwait Woodfin Ganja MD ELECTRONICALLY SIGNED 02/03/2013 2:35

## 2014-05-28 NOTE — Telephone Encounter (Signed)
Unread mychart message mailed to patient 

## 2014-05-30 ENCOUNTER — Other Ambulatory Visit: Payer: Self-pay | Admitting: *Deleted

## 2014-05-30 MED ORDER — ALPRAZOLAM 0.5 MG PO TABS: 0.5000 mg | ORAL_TABLET | Freq: Two times a day (BID) | ORAL | Status: DC | PRN

## 2014-05-30 NOTE — Telephone Encounter (Signed)
Pt stopped by my desk this morning stating that she forgot to ask for a refill on her Xanax (We seen on 05/24/14). Please advise if okay to refill.

## 2014-05-30 NOTE — Telephone Encounter (Signed)
Rx faxed to pharmacy  

## 2014-05-30 NOTE — Telephone Encounter (Signed)
ok'd rx for xanax #90 with no refills.

## 2014-05-31 ENCOUNTER — Other Ambulatory Visit (INDEPENDENT_AMBULATORY_CARE_PROVIDER_SITE_OTHER): Payer: 59

## 2014-05-31 ENCOUNTER — Encounter: Payer: Self-pay | Admitting: Internal Medicine

## 2014-05-31 DIAGNOSIS — Z1211 Encounter for screening for malignant neoplasm of colon: Secondary | ICD-10-CM

## 2014-05-31 LAB — FECAL OCCULT BLOOD, IMMUNOCHEMICAL: Fecal Occult Bld: NEGATIVE

## 2014-06-05 NOTE — Telephone Encounter (Signed)
Unread mychart message mailed to patient 

## 2014-06-09 ENCOUNTER — Ambulatory Visit
Admission: EM | Admit: 2014-06-09 | Discharge: 2014-06-09 | Disposition: A | Discharge status: Home / Self Care | Payer: 59 | Attending: Registered Nurse | Admitting: Registered Nurse

## 2014-06-09 ENCOUNTER — Ambulatory Visit: Admission: RE | Admit: 2014-06-09 | Payer: 59 | Source: Ambulatory Visit

## 2014-06-09 DIAGNOSIS — H6983 Other specified disorders of Eustachian tube, bilateral: Secondary | ICD-10-CM

## 2014-06-09 DIAGNOSIS — J0110 Acute frontal sinusitis, unspecified: Secondary | ICD-10-CM | POA: Diagnosis not present

## 2014-06-09 DIAGNOSIS — J0100 Acute maxillary sinusitis, unspecified: Secondary | ICD-10-CM | POA: Diagnosis not present

## 2014-06-09 MED ORDER — FLUTICASONE PROPIONATE 50 MCG/ACT NA SUSP: 1.0000 | Freq: Two times a day (BID) | NASAL | Status: DC

## 2014-06-09 MED ORDER — SALINE SPRAY 0.65 % NA SOLN: 2.0000 | NASAL | Status: DC

## 2014-06-09 MED ORDER — DIPHENHYDRAMINE HCL 25 MG PO TABS: 25.0000 mg | ORAL_TABLET | Freq: Four times a day (QID) | ORAL | Status: DC

## 2014-06-09 MED ORDER — GUAIFENESIN 100 MG/5ML PO LIQD: 100.0000 mg | ORAL | Status: DC | PRN

## 2014-06-09 MED ORDER — AMOXICILLIN 875 MG PO TABS
875.0000 mg | ORAL_TABLET | Freq: Two times a day (BID) | ORAL | Status: DC
Start: 2014-06-09 — End: 2015-08-05

## 2014-06-09 NOTE — ED Provider Notes (Signed)
CSN: [id number]     Arrival date & time 06/09/14  9323 History   None    Chief Complaint  Patient presents with  . URI  . Sinusitis   (Consider location/radiation/quality/duration/timing/severity/associated sxs/prior Treatment) The history is provided by the patient.  64y/o caucasian female with worsening hoarse voice, post nasal drip, pressure in cheeks and forehead, ear discomfort, lateral neck TTP, noted sore throat 1 week ago, productive intermittent yellow nasal drainage/cough, chest congestion, noted wheeze with cough at night after cleaning musty building earlier this week. Has triedzyrtec, rexall mucous release, benadryl, exclear organic nasal spray, vitamin C.  Denied fever, chills, rash, nausea, vomiting, diarrhea, ear discharge, hemoptysis  Past Medical History  Diagnosis Date  . Hypertension   . Hypercholesteremia   . CAD (coronary artery disease) 2003    s/p CABG x 5 (Dr Evelina Dun), [place]- 2010  . PVD (peripheral vascular disease)     s/p intervention (left) - 2004, right (2006)  . Endometriosis    Past Surgical History  Procedure Laterality Date  . Coronary artery bypass graft      2003  . Abdominal hysterectomy    . Breast surgery  2004    cyst removed  . Cataract surgery  2007    left   Family History  Problem Relation Age of Onset  . Heart disease Mother   . Heart disease Father     CABG  . CVA Mother   . Heart disease      aunts and uncles  . Breast cancer Neg Hx   . Colon cancer Neg Hx    History  Substance Use Topics  . Smoking status: Former Smoker    Quit date: 09/03/2008  . Smokeless tobacco: Never Used  . Alcohol Use: 0.0 oz/week    0 Standard drinks or equivalent per week     Comment: once a year   OB History    No data available     Review of Systems  Constitutional: Positive for fatigue.  HENT: Positive for congestion, ear pain, postnasal drip, rhinorrhea, sinus pressure, sneezing, sore throat and voice change. Negative for ear discharge,  facial swelling, hearing loss, mouth sores, nosebleeds, tinnitus and trouble swallowing.   Eyes: Negative.   Respiratory: Positive for cough.   Cardiovascular: Negative.   Gastrointestinal: Negative.   Genitourinary: Negative for dysuria and difficulty urinating.  Musculoskeletal: Negative.   Skin: Negative.   Allergic/Immunologic: Positive for environmental allergies.  Neurological: Positive for headaches.  Hematological: Negative.   Psychiatric/Behavioral: Negative.     Allergies  Review of patient's allergies indicates no known allergies.  Home Medications   Prior to Admission medications   Medication Sig Start Date End Date Taking? Authorizing Provider  ALPRAZolam Duanne Moron) 0.5 MG tablet Take 1 tablet (0.5 mg total) by mouth 2 (two) times daily as needed for sleep. 05/30/14  Yes Einar Pheasant, MD  carvedilol (COREG) 3.125 MG tablet Take 1 tablet (3.125 mg total) by mouth 2 (two) times daily with a meal. 12/01/11  Yes Einar Pheasant, MD  clopidogrel (PLAVIX) 75 MG tablet Take 1 tablet (75 mg total) by mouth daily. 12/01/11  Yes Einar Pheasant, MD  furosemide (LASIX) 40 MG tablet Take 40 mg by mouth 2 (two) times daily. 12/01/11  Yes Einar Pheasant, MD  isosorbide mononitrate (IMDUR) 60 MG 24 hr tablet Take 60 mg by mouth 2 (two) times daily. 120mg -am, 60mg -pm   Yes Historical Provider, MD  KRILL OIL PO Take by mouth daily.   Yes  Historical Provider, MD  losartan (COZAAR) 25 MG tablet Take 12.5 mg by mouth daily.   Yes Historical Provider, MD  nitroGLYCERIN (NITROSTAT) 0.4 MG SL tablet Place 1 tablet (0.4 mg total) under the tongue every 5 (five) minutes as needed for chest pain. 12/01/11  Yes Einar Pheasant, MD  OVER THE COUNTER MEDICATION Omega XL   Yes Historical Provider, MD  potassium chloride (K-DUR) 10 MEQ tablet Take 10 mEq by mouth 2 (two) times daily.    Yes Historical Provider, MD  spironolactone (ALDACTONE) 25 MG tablet 25 mg. Take 1/2 tablet daily 12/01/11  Yes Einar Pheasant, MD  TURMERIC PO Take by mouth daily.   Yes Historical Provider, MD  amoxicillin (AMOXIL) 875 MG tablet Take 1 tablet (875 mg total) by mouth 2 (two) times daily. 06/09/14   Olen Cordial, NP  diphenhydrAMINE (BENADRYL) 25 MG tablet Take 1 tablet (25 mg total) by mouth every 6 (six) hours. 06/09/14   Olen Cordial, NP  fluticasone (FLONASE) 50 MCG/ACT nasal spray Place 1 spray into both nostrils 2 (two) times daily. 06/09/14   Olen Cordial, NP  guaiFENesin (ROBITUSSIN) 100 MG/5ML liquid Take 5-10 mLs (100-200 mg total) by mouth every 4 (four) hours as needed for cough or congestion. 06/09/14   Olen Cordial, NP  sodium chloride (OCEAN) 0.65 % SOLN nasal spray Place 2 sprays into both nostrils every 2 (two) hours while awake. 06/09/14   Aura Fey Betancourt, NP   BP 101/66 mmHg  Pulse 80  Temp(Src) 98.3 F (36.8 C) (Tympanic)  Resp 18  Ht 5\' 7"  (1.702 m)  Wt 160 lb (72.576 kg)  BMI 25.05 kg/m2  SpO2 99% Physical Exam  Constitutional: She is oriented to person, place, and time. Vital signs are normal. She appears well-developed and well-nourished. No distress.  HENT:  Head: Normocephalic and atraumatic.  Right Ear: Hearing, external ear and ear canal normal. Tympanic membrane is injected. Tympanic membrane is not erythematous. A middle ear effusion is present.  Left Ear: Hearing, external ear and ear canal normal. Tympanic membrane is not injected and not erythematous. A middle ear effusion is present.  Nose: Mucosal edema and rhinorrhea present. No nose lacerations, sinus tenderness, nasal deformity or septal deviation. No epistaxis. Right sinus exhibits maxillary sinus tenderness and frontal sinus tenderness. Left sinus exhibits maxillary sinus tenderness and frontal sinus tenderness.  Mouth/Throat: Uvula is midline. Mucous membranes are not pale, dry and not cyanotic. No oral lesions. No trismus in the jaw. No uvula swelling. Posterior oropharyngeal edema and posterior  oropharyngeal erythema present. No oropharyngeal exudate or tonsillar abscesses.  Anterior neck over eustachian tubes bilaterally TTP; bilateral TMs with air fluid level slight opacity right greater than left; maxillary sinus TTP greater than frontal TTP, cobblestoning posterior pharynx; bilateral turbinates with edema/erythema/clear discharge; tongue with white coating mucous membranes dry; hoarse voice  Eyes: Conjunctivae, EOM and lids are normal. Pupils are equal, round, and reactive to light. Right eye exhibits no discharge. Left eye exhibits no discharge. No scleral icterus.  Neck: Trachea normal and normal range of motion. Neck supple. No JVD present. No tracheal deviation present. No thyromegaly present.  Cardiovascular: Normal rate, regular rhythm, normal heart sounds and intact distal pulses.   Pulmonary/Chest: Effort normal and breath sounds normal. No accessory muscle usage. No respiratory distress. She has no decreased breath sounds. She has no wheezes. She has no rales. She exhibits no tenderness.  Musculoskeletal: Normal range of motion. She exhibits no edema or  tenderness.  Lymphadenopathy:    She has no cervical adenopathy.  Neurological: She is alert and oriented to person, place, and time.  Skin: Skin is warm, dry and intact. She is not diaphoretic.  Psychiatric: She has a normal mood and affect. Her speech is normal and behavior is normal. Judgment and thought content normal. Cognition and memory are normal.    ED Course  Procedures (including critical care time) Labs Review Labs Reviewed - No data to display  Imaging Review No results found. Discussed with patient bilateral otitis media with effusion typical takes 30 days for fluid to resolve from ears.  If fever, worsening pain, ear discharge follow up for re-evaluation.  Non toxic.  Discussed with patient to hydrate more as mucous membranes dry tolerating po easily.  I do not see where any further testing or imaging is  necessary at this time.   I will suggest supportive care, rest, good hygiene and encourage the patient to take adequate fluids.  The patient is to return to clinic or EMERGENCY ROOM if symptoms worsen or change significantly.  May continue zyrtec, benadryl, rexall mucous release but do not take mucous release at bedtime may worsen cough consider robitussin OTC po prn instead of rexall mucous relief.  Patient has taken amoxillin previously without side effects prefers over augmentin.  Start flonase 1 spray each nostril BID.  Continue nasal saline while awake 2 sprays prn nasal congestion.  Exitcare handout on sinusitis given to patient.  Patient verbalized agreement and understanding of treatment plan and had no further questions at this time.   P2:  Hand washing and cover cough  MDM   1. Acute maxillary sinusitis, recurrence not specified   2. Acute frontal sinusitis, recurrence not specified   3. Eustachian tube dysfunction, bilateral       Olen Cordial, NP 06/09/14 1117

## 2014-06-09 NOTE — Discharge Instructions (Signed)
Allergic Rhinitis Allergic rhinitis is when the mucous membranes in the nose respond to allergens. Allergens are particles in the air that cause your body to have an allergic reaction. This causes you to release allergic antibodies. Through a chain of events, these eventually cause you to release histamine into the blood stream. Although meant to protect the body, it is this release of histamine that causes your discomfort, such as frequent sneezing, congestion, and an itchy, runny nose.  CAUSES  Seasonal allergic rhinitis (hay fever) is caused by pollen allergens that may come from grasses, trees, and weeds. Year-round allergic rhinitis (perennial allergic rhinitis) is caused by allergens such as house dust mites, pet dander, and mold spores.  SYMPTOMS   Nasal stuffiness (congestion).  Itchy, runny nose with sneezing and tearing of the eyes. DIAGNOSIS  Your health care provider can help you determine the allergen or allergens that trigger your symptoms. If you and your health care provider are unable to determine the allergen, skin or blood testing may be used. TREATMENT  Allergic rhinitis does not have a cure, but it can be controlled by:  Medicines and allergy shots (immunotherapy).  Avoiding the allergen. Hay fever may often be treated with antihistamines in pill or nasal spray forms. Antihistamines block the effects of histamine. There are over-the-counter medicines that may help with nasal congestion and swelling around the eyes. Check with your health care provider before taking or giving this medicine.  If avoiding the allergen or the medicine prescribed do not work, there are many new medicines your health care provider can prescribe. Stronger medicine may be used if initial measures are ineffective. Desensitizing injections can be used if medicine and avoidance does not work. Desensitization is when a patient is given ongoing shots until the body becomes less sensitive to the allergen.  Make sure you follow up with your health care provider if problems continue. HOME CARE INSTRUCTIONS It is not possible to completely avoid allergens, but you can reduce your symptoms by taking steps to limit your exposure to them. It helps to know exactly what you are allergic to so that you can avoid your specific triggers. SEEK MEDICAL CARE IF:   You have a fever.  You develop a cough that does not stop easily (persistent).  You have shortness of breath.  You start wheezing.  Symptoms interfere with normal daily activities. Document Released: 10/14/2000 Document Revised: 01/24/2013 Document Reviewed: 09/26/2012 [facility] Patient Information 2015 [place], [place]. This information is not intended to replace advice given to you by your health care provider. Make sure you discuss any questions you have with your health care provider. Sinusitis Sinusitis is redness, soreness, and inflammation of the paranasal sinuses. Paranasal sinuses are air pockets within the bones of your face (beneath the eyes, the middle of the forehead, or above the eyes). In healthy paranasal sinuses, mucus is able to drain out, and air is able to circulate through them by way of your nose. However, when your paranasal sinuses are inflamed, mucus and air can become trapped. This can allow bacteria and other germs to grow and cause infection. Sinusitis can develop quickly and last only a short time (acute) or continue over a long period (chronic). Sinusitis that lasts for more than 12 weeks is considered chronic.  CAUSES  Causes of sinusitis include:  Allergies.  Structural abnormalities, such as displacement of the cartilage that separates your nostrils (deviated septum), which can decrease the air flow through your nose and sinuses and affect sinus  drainage.  Functional abnormalities, such as when the small hairs (cilia) that line your sinuses and help remove mucus do not work properly or are not present. SIGNS AND  SYMPTOMS  Symptoms of acute and chronic sinusitis are the same. The primary symptoms are pain and pressure around the affected sinuses. Other symptoms include:  Upper toothache.  Earache.  Headache.  Bad breath.  Decreased sense of smell and taste.  A cough, which worsens when you are lying flat.  Fatigue.  Fever.  Thick drainage from your nose, which often is green and may contain pus (purulent).  Swelling and warmth over the affected sinuses. DIAGNOSIS  Your health care provider will perform a physical exam. During the exam, your health care provider may:  Look in your nose for signs of abnormal growths in your nostrils (nasal polyps).  Tap over the affected sinus to check for signs of infection.  View the inside of your sinuses (endoscopy) using an imaging device that has a light attached (endoscope). If your health care provider suspects that you have chronic sinusitis, one or more of the following tests may be recommended:  Allergy tests.  Nasal culture. A sample of mucus is taken from your nose, sent to a lab, and screened for bacteria.  Nasal cytology. A sample of mucus is taken from your nose and examined by your health care provider to determine if your sinusitis is related to an allergy. TREATMENT  Most cases of acute sinusitis are related to a viral infection and will resolve on their own within 10 days. Sometimes medicines are prescribed to help relieve symptoms (pain medicine, decongestants, nasal steroid sprays, or saline sprays).  However, for sinusitis related to a bacterial infection, your health care provider will prescribe antibiotic medicines. These are medicines that will help kill the bacteria causing the infection.  Rarely, sinusitis is caused by a fungal infection. In theses cases, your health care provider will prescribe antifungal medicine. For some cases of chronic sinusitis, surgery is needed. Generally, these are cases in which sinusitis recurs  more than 3 times per year, despite other treatments. HOME CARE INSTRUCTIONS   Drink plenty of water. Water helps thin the mucus so your sinuses can drain more easily.  Use a humidifier.  Inhale steam 3 to 4 times a day (for example, sit in the bathroom with the shower running).  Apply a warm, moist washcloth to your face 3 to 4 times a day, or as directed by your health care provider.  Use saline nasal sprays to help moisten and clean your sinuses.  Take medicines only as directed by your health care provider.  If you were prescribed either an antibiotic or antifungal medicine, finish it all even if you start to feel better. SEEK IMMEDIATE MEDICAL CARE IF:  You have increasing pain or severe headaches.  You have nausea, vomiting, or drowsiness.  You have swelling around your face.  You have vision problems.  You have a stiff neck.  You have difficulty breathing. MAKE SURE YOU:   Understand these instructions.  Will watch your condition.  Will get help right away if you are not doing well or get worse. Document Released: 01/19/2005 Document Revised: 06/05/2013 Document Reviewed: 02/03/2011 [facility] Patient Information 2015 [place], [place]. This information is not intended to replace advice given to you by your health care provider. Make sure you discuss any questions you have with your health care provider.

## 2014-06-09 NOTE — ED Notes (Signed)
Started last Sunday with slight sore throat. Worsening symptoms Thursday with cough and congestion. Now + sinus pressure/pain. + productive green cough. Denies fever

## 2014-09-12 ENCOUNTER — Telehealth: Payer: Self-pay | Admitting: *Deleted

## 2014-09-12 MED ORDER — ALPRAZOLAM 0.5 MG PO TABS: 0.5000 mg | ORAL_TABLET | Freq: Two times a day (BID) | ORAL | Status: DC | PRN

## 2014-09-12 NOTE — Telephone Encounter (Signed)
rx ok'd for alprazolam #90 with no refills.

## 2014-09-12 NOTE — Telephone Encounter (Signed)
rx faxed

## 2014-09-12 NOTE — Telephone Encounter (Signed)
Fax from pharmacy requesting alprazolam.  Last refill 4.27.16; last OV 4.21.16.  Please advise refill

## 2014-10-22 ENCOUNTER — Encounter: Payer: Self-pay | Admitting: Internal Medicine

## 2014-10-22 ENCOUNTER — Telehealth: Payer: Self-pay | Admitting: Internal Medicine

## 2014-10-22 NOTE — Telephone Encounter (Signed)
Letter printed and placed in your box.

## 2014-10-22 NOTE — Telephone Encounter (Signed)
Letter placed up front for pick up & pt notified

## 2014-10-22 NOTE — Telephone Encounter (Signed)
Pt called stating she needs a letter for court duty she needs a letter regarding not able to walk and with her condition. Pt states she needs it this week. Thank You!

## 2014-11-20 ENCOUNTER — Encounter: Payer: Self-pay | Admitting: Internal Medicine

## 2014-11-20 ENCOUNTER — Ambulatory Visit (INDEPENDENT_AMBULATORY_CARE_PROVIDER_SITE_OTHER): Payer: 59 | Admitting: Internal Medicine

## 2014-11-20 VITALS — BP 100/60 | HR 82 | Temp 98.3°F | Resp 18 | Ht 66.5 in | Wt 165.0 lb

## 2014-11-20 DIAGNOSIS — I739 Peripheral vascular disease, unspecified: Secondary | ICD-10-CM

## 2014-11-20 DIAGNOSIS — I10 Essential (primary) hypertension: Secondary | ICD-10-CM

## 2014-11-20 DIAGNOSIS — I2510 Atherosclerotic heart disease of native coronary artery without angina pectoris: Secondary | ICD-10-CM

## 2014-11-20 DIAGNOSIS — E7800 Pure hypercholesterolemia, unspecified: Secondary | ICD-10-CM

## 2014-11-20 DIAGNOSIS — R0989 Other specified symptoms and signs involving the circulatory and respiratory systems: Secondary | ICD-10-CM | POA: Diagnosis not present

## 2014-11-20 MED ORDER — ALPRAZOLAM 0.5 MG PO TABS: 0.5000 mg | ORAL_TABLET | Freq: Every day | ORAL | Status: DC | PRN

## 2014-11-20 NOTE — Progress Notes (Signed)
[name], female   DOB: [birth year], 64 y.o.   MRN: [id number]   Subjective:    [name], female    DOB: Jun 02, 1950, 63 y.o.   MRN: [id number]  HPI  Patient with past history of CAD, hypertension, PVD and hypercholesterolemia who comes in today to follow up on these issues.  She still has "spasms" in her chest.  This is overall stable.  She is having acupuncture.  Due to f/u with Dr Megan Salon next week.  Still with some leg discomfort with walking.  Has known PVD.  Due to see Dr Lucky Cowboy in two weeks.  Are holding on another stent as long as stable.  No sob.  No increased cough or congestion.  No acid reflux.  No abdominal pain or cramping.  Bowels overall stable.  She is not taking cholesterol medication.  Is on Krill oil, red yeast rice and tumeric.     Past Medical History  Diagnosis Date  . Hypertension   . Hypercholesteremia   . CAD (coronary artery disease) 2003    s/p CABG x 5 (Dr Evelina Dun), [place]- 2010  . PVD (peripheral vascular disease) ([place])     s/p intervention (left) - 2004, right (2006)  . Endometriosis    Past Surgical History  Procedure Laterality Date  . Coronary artery bypass graft      2003  . Abdominal hysterectomy    . Breast surgery  2004    cyst removed  . Cataract surgery  2007    left   Family History  Problem Relation Age of Onset  . Heart disease Mother   . Heart disease Father     CABG  . CVA Mother   . Heart disease      aunts and uncles  . Breast cancer Neg Hx   . Colon cancer Neg Hx    Social History   Social History  . Marital Status: Married    Spouse Name: N/A  . Number of Children: N/A  . Years of Education: N/A   Social History Main Topics  . Smoking status: Former Smoker    Quit date: 09/03/2008  . Smokeless tobacco: Never Used  . Alcohol Use: 0.0 oz/week    0 Standard drinks or equivalent per week     Comment: once a year  . Drug Use: No  . Sexual Activity: Not Asked   Other Topics  Concern  . None   Social History Narrative    Outpatient Encounter Prescriptions as of 11/20/2014  Medication Sig  . ALPRAZolam (XANAX) 0.5 MG tablet Take 1 tablet (0.5 mg total) by mouth daily as needed for sleep.  Marland Kitchen amoxicillin (AMOXIL) 875 MG tablet Take 1 tablet (875 mg total) by mouth 2 (two) times daily.  . carvedilol (COREG) 3.125 MG tablet Take 1 tablet (3.125 mg total) by mouth 2 (two) times daily with a meal.  . clopidogrel (PLAVIX) 75 MG tablet Take 1 tablet (75 mg total) by mouth daily.  . diphenhydrAMINE (BENADRYL) 25 MG tablet Take 1 tablet (25 mg total) by mouth every 6 (six) hours.  . fluticasone (FLONASE) 50 MCG/ACT nasal spray Place 1 spray into both nostrils 2 (two) times daily.  . furosemide (LASIX) 40 MG tablet Take 40 mg by mouth 2 (two) times daily.  . isosorbide mononitrate (IMDUR) 60 MG 24 hr tablet Take 60 mg by mouth 2 (two) times daily. 120mg -am, 60mg -pm  . KRILL OIL PO Take by mouth daily.  Marland Kitchen  losartan (COZAAR) 25 MG tablet Take 12.5 mg by mouth daily.  . nitroGLYCERIN (NITROSTAT) 0.4 MG SL tablet Place 1 tablet (0.4 mg total) under the tongue every 5 (five) minutes as needed for chest pain.  Marland Kitchen OVER THE COUNTER MEDICATION Omega XL  . potassium chloride (K-DUR) 10 MEQ tablet Take 10 mEq by mouth 2 (two) times daily.   Marland Kitchen spironolactone (ALDACTONE) 25 MG tablet 25 mg. Take 1/2 tablet daily  . TURMERIC PO Take by mouth daily.  . [DISCONTINUED] ALPRAZolam (XANAX) 0.5 MG tablet Take 1 tablet (0.5 mg total) by mouth 2 (two) times daily as needed for sleep.  . [DISCONTINUED] guaiFENesin (ROBITUSSIN) 100 MG/5ML liquid Take 5-10 mLs (100-200 mg total) by mouth every 4 (four) hours as needed for cough or congestion. (Patient not taking: Reported on 11/20/2014)  . [DISCONTINUED] sodium chloride (OCEAN) 0.65 % SOLN nasal spray Place 2 sprays into both nostrils every 2 (two) hours while awake. (Patient not taking: Reported on 11/20/2014)   No facility-administered encounter  medications on file as of 11/20/2014.    Review of Systems  Constitutional: Negative for appetite change and unexpected weight change.  HENT: Negative for congestion and sinus pressure.   Eyes: Negative for discharge and visual disturbance.  Respiratory: Negative for cough, chest tightness and shortness of breath.   Cardiovascular: Negative for chest pain, palpitations and leg swelling.  Gastrointestinal: Negative for nausea, vomiting and abdominal pain.  Genitourinary: Negative for dysuria and difficulty urinating.  Musculoskeletal: Negative for joint swelling.       Leg pain with walking.  Stable.    Skin: Negative for color change and rash.  Neurological: Negative for dizziness, light-headedness and headaches.  Psychiatric/Behavioral: Negative for dysphoric mood and agitation.       Objective:    Physical Exam  Constitutional: She appears well-developed and well-nourished. No distress.  HENT:  Nose: Nose normal.  Mouth/Throat: Oropharynx is clear and moist.  Eyes: Conjunctivae are normal. Right eye exhibits no discharge. Left eye exhibits no discharge.  Neck: Neck supple. No thyromegaly present.  Cardiovascular: Normal rate and regular rhythm.   Pulmonary/Chest: Breath sounds normal. No respiratory distress. She has no wheezes.  Abdominal: Soft. Bowel sounds are normal. There is no tenderness.  Musculoskeletal: She exhibits no edema or tenderness.  Lymphadenopathy:    She has no cervical adenopathy.  Skin: No rash noted. No erythema.  Psychiatric: She has a normal mood and affect. Her behavior is normal.    BP 100/60 mmHg  Pulse 82  Temp(Src) 98.3 F (36.8 C) (Oral)  Resp 18  Ht 5' 6.5" (1.689 m)  Wt 165 lb (74.844 kg)  BMI 26.24 kg/m2  SpO2 97% Wt Readings from Last 3 Encounters:  11/20/14 165 lb (74.844 kg)  06/09/14 160 lb (72.576 kg)  05/24/14 160 lb 8 oz (72.802 kg)     Lab Results  Component Value Date   WBC 4.8 07/12/2013   HGB 12.6 07/12/2013   HCT  38.0 07/12/2013   PLT 192.0 07/12/2013   GLUCOSE 114* 05/24/2014   CHOL 280* 05/24/2014   TRIG 250.0* 05/24/2014   HDL 43.90 05/24/2014   LDLDIRECT 200.0 05/24/2014   LDLCALC 107* 07/12/2013   ALT 17 05/24/2014   AST 19 05/24/2014   NA 140 05/24/2014   K 4.2 05/24/2014   CL 104 05/24/2014   CREATININE 0.99 05/24/2014   BUN 9 05/24/2014   CO2 29 05/24/2014   TSH 1.32 05/24/2014       Assessment &  Plan:   Problem List Items Addressed This Visit    CAD (coronary artery disease)    Followed by Dr Megan Salon.  Has appt next week.  Some spasms as outlined.  Overall stable.  Follow.  Continue aggressive risk factor modification.        Relevant Orders   Ambulatory referral to Cardiology   Carotid bruit    Followed by Dr Lucky Cowboy for her vascular issues.  Have them follow up her carotids.        Relevant Orders   Ambulatory referral to Vascular Surgery   Hypercholesteremia    Low cholesterol diet and exercise.  Has had intolerance to statin medications.  Not on any prescription medications now.  Check lipid panel.  She is planning to get through a screening panel.  Will forward results to me.        Hypertension - Primary    Blood pressure under good control.  Continue same medication regimen.  Follow pressures.  Follow metabolic panel.        Peripheral vascular disease ([facility])    Leg pain with walking.  Followed by Dr Lucky Cowboy.  Has f/u planned in two weeks.  Continue risk factor modification.        Relevant Orders   Ambulatory referral to Vascular Surgery       Einar Pheasant, MD

## 2014-11-20 NOTE — Progress Notes (Signed)
Pre-visit discussion using our clinic review tool. No additional management support is needed unless otherwise documented below in the visit note.  

## 2014-11-25 ENCOUNTER — Encounter: Payer: Self-pay | Admitting: Internal Medicine

## 2014-11-25 NOTE — Assessment & Plan Note (Signed)
Followed by Dr Lucky Cowboy for her vascular issues.  Have them follow up her carotids.

## 2014-11-25 NOTE — Assessment & Plan Note (Signed)
Followed by Dr Megan Salon.  Has appt next week.  Some spasms as outlined.  Overall stable.  Follow.  Continue aggressive risk factor modification.

## 2014-11-25 NOTE — Assessment & Plan Note (Signed)
Low cholesterol diet and exercise.  Has had intolerance to statin medications.  Not on any prescription medications now.  Check lipid panel.  She is planning to get through a screening panel.  Will forward results to me.

## 2014-11-25 NOTE — Assessment & Plan Note (Signed)
Leg pain with walking.  Followed by Dr Lucky Cowboy.  Has f/u planned in two weeks.  Continue risk factor modification.

## 2014-11-25 NOTE — Assessment & Plan Note (Signed)
Blood pressure under good control.  Continue same medication regimen.  Follow pressures.  Follow metabolic panel.   

## 2015-01-17 ENCOUNTER — Telehealth: Payer: Self-pay | Admitting: Internal Medicine

## 2015-01-17 ENCOUNTER — Encounter: Payer: Self-pay | Admitting: *Deleted

## 2015-01-17 DIAGNOSIS — Z1211 Encounter for screening for malignant neoplasm of colon: Secondary | ICD-10-CM

## 2015-01-17 NOTE — Telephone Encounter (Signed)
Order placed for referral.  Please notify that someone should be calling with an appt date and time.  Let us know if any problems

## 2015-01-17 NOTE — Telephone Encounter (Signed)
Pt lvm requesting a referral for a colonoscopy with Dr. Fleet Contras. Please advise

## 2015-01-17 NOTE — Telephone Encounter (Signed)
Please advise 

## 2015-01-17 NOTE — Telephone Encounter (Signed)
Spoke with the patient.  Verbalized understanding.  She wanted to know if it can be scheduled before the end of the year?  Please advise?

## 2015-02-07 ENCOUNTER — Ambulatory Visit: Payer: PRIVATE HEALTH INSURANCE | Admitting: General Surgery

## 2015-03-27 ENCOUNTER — Encounter: Payer: Self-pay | Admitting: *Deleted

## 2015-06-05 ENCOUNTER — Encounter: Payer: 59 | Admitting: Internal Medicine

## 2015-07-24 LAB — LIPID PANEL
Cholesterol: 230 mg/dL — AB (ref 0–200)
HDL: 51 mg/dL (ref 35–70)
LDL CALC: 157 mg/dL
Triglycerides: 181 mg/dL — AB (ref 40–160)

## 2015-08-05 ENCOUNTER — Other Ambulatory Visit (HOSPITAL_COMMUNITY)
Admission: RE | Admit: 2015-08-05 | Discharge: 2015-08-05 | Disposition: A | Discharge status: Home / Self Care | Payer: Medicare Other | Source: Ambulatory Visit | Attending: Internal Medicine | Admitting: Internal Medicine

## 2015-08-05 ENCOUNTER — Ambulatory Visit (INDEPENDENT_AMBULATORY_CARE_PROVIDER_SITE_OTHER): Payer: Medicare Other | Admitting: Internal Medicine

## 2015-08-05 ENCOUNTER — Encounter: Payer: Self-pay | Admitting: Internal Medicine

## 2015-08-05 VITALS — BP 120/80 | HR 82 | Temp 98.3°F | Resp 17 | Ht 67.0 in | Wt 155.4 lb

## 2015-08-05 DIAGNOSIS — Z1151 Encounter for screening for human papillomavirus (HPV): Secondary | ICD-10-CM | POA: Insufficient documentation

## 2015-08-05 DIAGNOSIS — Z01419 Encounter for gynecological examination (general) (routine) without abnormal findings: Secondary | ICD-10-CM | POA: Insufficient documentation

## 2015-08-05 DIAGNOSIS — N898 Other specified noninflammatory disorders of vagina: Secondary | ICD-10-CM | POA: Diagnosis not present

## 2015-08-05 DIAGNOSIS — N760 Acute vaginitis: Secondary | ICD-10-CM

## 2015-08-05 DIAGNOSIS — E7800 Pure hypercholesterolemia, unspecified: Secondary | ICD-10-CM

## 2015-08-05 DIAGNOSIS — J329 Chronic sinusitis, unspecified: Secondary | ICD-10-CM

## 2015-08-05 DIAGNOSIS — Z0000 Encounter for general adult medical examination without abnormal findings: Secondary | ICD-10-CM | POA: Diagnosis not present

## 2015-08-05 DIAGNOSIS — Z124 Encounter for screening for malignant neoplasm of cervix: Secondary | ICD-10-CM

## 2015-08-05 DIAGNOSIS — I10 Essential (primary) hypertension: Secondary | ICD-10-CM

## 2015-08-05 DIAGNOSIS — I2510 Atherosclerotic heart disease of native coronary artery without angina pectoris: Secondary | ICD-10-CM

## 2015-08-05 DIAGNOSIS — I739 Peripheral vascular disease, unspecified: Secondary | ICD-10-CM

## 2015-08-05 DIAGNOSIS — Z1211 Encounter for screening for malignant neoplasm of colon: Secondary | ICD-10-CM

## 2015-08-05 DIAGNOSIS — R0989 Other specified symptoms and signs involving the circulatory and respiratory systems: Secondary | ICD-10-CM

## 2015-08-05 MED ORDER — AMOXICILLIN 875 MG PO TABS: 875.0000 mg | ORAL_TABLET | Freq: Two times a day (BID) | ORAL | Status: DC

## 2015-08-05 NOTE — Patient Instructions (Signed)
Saline nasal spray - flush nose at least 2-3x/day 

## 2015-08-05 NOTE — Progress Notes (Signed)
[name], female   DOB: 08-10-1950, 65 y.o.   MRN: [id number]   Subjective:    [name], female    DOB: [birth year], 65 y.o.   MRN: [id number]  HPI  Patient here for her physical exam.  She reports for the last two weeks she has had symptoms c/w her sinus infections. She reports increased left side pressure and left ear pressure.  Increased drainage.  Increased cough - productive.  No fever. No nausea or vomiting.  No abdominal pain or cramping.  Bowels stable.  She was seeing a nutritionist.  Has decreased carbs.  Spasms in her chest are not as bad.  She is supposed to be on 80mg  of pravastatin.  Just had labs.  Will get me a copy.  She also has noticed some vaginal discharge.     Past Medical History  Diagnosis Date  . Hypertension   . Hypercholesteremia   . CAD (coronary artery disease) 2003    s/p CABG x 5 (Dr Evelina Dun), [place]- 2010  . PVD (peripheral vascular disease) ([place])     s/p intervention (left) - 2004, right (2006)  . Endometriosis    Past Surgical History  Procedure Laterality Date  . Coronary artery bypass graft      2003  . Abdominal hysterectomy    . Breast surgery  2004    cyst removed  . Cataract surgery  2007    left   Family History  Problem Relation Age of Onset  . Heart disease Mother   . Heart disease Father     CABG  . CVA Mother   . Heart disease      aunts and uncles  . Breast cancer Neg Hx   . Colon cancer Neg Hx    Social History   Social History  . Marital Status: Married    Spouse Name: N/A  . Number of Children: N/A  . Years of Education: N/A   Social History Main Topics  . Smoking status: Former Smoker    Quit date: 09/03/2008  . Smokeless tobacco: Never Used  . Alcohol Use: 0.0 oz/week    0 Standard drinks or equivalent per week     Comment: once a year  . Drug Use: No  . Sexual Activity: Not Asked   Other Topics Concern  . None   Social History Narrative    Outpatient Encounter  Prescriptions as of 08/05/2015  Medication Sig  . ALPRAZolam (XANAX) 0.5 MG tablet Take 1 tablet (0.5 mg total) by mouth daily as needed for sleep. (Patient taking differently: Take 0.5 mg by mouth 3 (three) times daily as needed for sleep. )  . Ascorbic Acid (VITAMIN C) 1000 MG tablet Take 2,000-3,000 mg by mouth daily.  . Bromelains 500 MG TABS Take 500 mg by mouth daily.  . carvedilol (COREG) 3.125 MG tablet Take 1 tablet (3.125 mg total) by mouth 2 (two) times daily with a meal.  . Cholecalciferol (D3 MAXIMUM STRENGTH) 5000 units capsule Take 10,000 Units by mouth daily.  . clopidogrel (PLAVIX) 75 MG tablet Take 1 tablet (75 mg total) by mouth daily.  . Coenzyme Q10 (CO Q 10) 100 MG CAPS Take 100 mg by mouth daily.  . furosemide (LASIX) 40 MG tablet Take 40 mg by mouth 2 (two) times daily.  . Garlic 123XX123 MG CAPS Take 1,000 mg by mouth daily.  . isosorbide mononitrate (IMDUR) 60 MG 24 hr tablet Take 60 mg by mouth  2 (two) times daily. 120mg -am, 60mg -pm  . KRILL OIL PO Take 2,000 mg by mouth daily.   Marland Kitchen losartan (COZAAR) 25 MG tablet Take 12.5 mg by mouth daily.  . Magnesium 250 MG TABS Take by mouth. Take 2 per day  . nitroGLYCERIN (NITROSTAT) 0.4 MG SL tablet Place 1 tablet (0.4 mg total) under the tongue every 5 (five) minutes as needed for chest pain.  . OIL OF OREGANO PO Take 1,920 mg by mouth daily.  . potassium chloride (K-DUR) 10 MEQ tablet Take 20 mEq by mouth daily.   Marland Kitchen QUERCETIN PO Take 500 mg by mouth daily.  Marland Kitchen spironolactone (ALDACTONE) 25 MG tablet Take 12.5 mg by mouth once.   . TURMERIC PO Take 448 mg by mouth daily.   . vitamin E (VITAMIN E) 400 UNIT capsule Take 400 Units by mouth daily.  . [DISCONTINUED] amoxicillin (AMOXIL) 875 MG tablet Take 1 tablet (875 mg total) by mouth 2 (two) times daily.  . [DISCONTINUED] OVER THE COUNTER MEDICATION Omega XL  . amoxicillin (AMOXIL) 875 MG tablet Take 1 tablet (875 mg total) by mouth 2 (two) times daily.  . diphenhydrAMINE  (BENADRYL) 25 MG tablet Take 1 tablet (25 mg total) by mouth every 6 (six) hours. (Patient not taking: Reported on 08/05/2015)  . fluticasone (FLONASE) 50 MCG/ACT nasal spray Place 1 spray into both nostrils 2 (two) times daily. (Patient not taking: Reported on 08/05/2015)   No facility-administered encounter medications on file as of 08/05/2015.    Review of Systems  Constitutional: Negative for appetite change and unexpected weight change.  HENT: Positive for congestion. Negative for sinus pressure.        Sinus pressure and ear fullness - left.   Eyes: Negative for pain and visual disturbance.  Respiratory: Positive for cough. Negative for chest tightness and shortness of breath.   Cardiovascular: Negative for chest pain, palpitations and leg swelling.  Gastrointestinal: Negative for nausea, vomiting, abdominal pain and diarrhea.  Genitourinary: Negative for dysuria and difficulty urinating.  Musculoskeletal: Negative for back pain and joint swelling.  Skin: Negative for color change and rash.  Neurological: Positive for headaches (head pressure.  ). Negative for dizziness and light-headedness.  Hematological: Negative for adenopathy. Does not bruise/bleed easily.  Psychiatric/Behavioral: Negative for dysphoric mood and agitation.       Objective:    Physical Exam  Constitutional: She is oriented to person, place, and time. She appears well-developed and well-nourished. No distress.  HENT:  Nose: Nose normal.  Mouth/Throat: Oropharynx is clear and moist.  Eyes: Right eye exhibits no discharge. Left eye exhibits no discharge. No scleral icterus.  Neck: Neck supple. No thyromegaly present.  Cardiovascular: Normal rate and regular rhythm.   Pulmonary/Chest: Breath sounds normal. No accessory muscle usage. No tachypnea. No respiratory distress. She has no decreased breath sounds. She has no wheezes. She has no rhonchi. Right breast exhibits no inverted nipple, no mass, no nipple discharge  and no tenderness (no axillary adenopathy). Left breast exhibits no inverted nipple, no mass, no nipple discharge and no tenderness (no axilarry adenopathy).  Abdominal: Soft. Bowel sounds are normal. There is no tenderness.  Genitourinary:  Normal external genitalia.  Vaginal vault without lesions.  Some discharge present  Could not appreciate any adnexal masses or tenderness.  KOH/wet prep obtained.   Musculoskeletal: She exhibits no edema or tenderness.  Lymphadenopathy:    She has no cervical adenopathy.  Neurological: She is alert and oriented to person, place, and time.  Skin: Skin is warm. No rash noted. No erythema.  Psychiatric: She has a normal mood and affect. Her behavior is normal.    BP 120/80 mmHg  Pulse 82  Temp(Src) 98.3 F (36.8 C) (Oral)  Resp 17  Ht 5\' 7"  (1.702 m)  Wt 155 lb 6 oz (70.478 kg)  BMI 24.33 kg/m2  SpO2 95% Wt Readings from Last 3 Encounters:  08/05/15 155 lb 6 oz (70.478 kg)  11/20/14 165 lb (74.844 kg)  06/09/14 160 lb (72.576 kg)     Lab Results  Component Value Date   WBC 4.8 07/12/2013   HGB 12.6 07/12/2013   HCT 38.0 07/12/2013   PLT 192.0 07/12/2013   GLUCOSE 114* 05/24/2014   CHOL 280* 05/24/2014   TRIG 250.0* 05/24/2014   HDL 43.90 05/24/2014   LDLDIRECT 200.0 05/24/2014   LDLCALC 107* 07/12/2013   ALT 17 05/24/2014   AST 19 05/24/2014   NA 140 05/24/2014   K 4.2 05/24/2014   CL 104 05/24/2014   CREATININE 0.99 05/24/2014   BUN 9 05/24/2014   CO2 29 05/24/2014   TSH 1.32 05/24/2014       Assessment & Plan:   Problem List Items Addressed This Visit    CAD (coronary artery disease)    Followed by Dr Megan Salon.  Spasms better.  Continue risk factor modification.       Carotid bruit    Followed by Dr Lucky Cowboy.  When she sees him next visit, going to discuss carotid ultrasound.       Health care maintenance    Physical today 08/05/15.  Declines mammogram.  Have discussed the need for colonoscopy.  Refer to Dr Bary Castilla.         Hypercholesteremia    Supposed to be taking pravastatin 80mg  per day now.  She is unsure dose taking and is not taking daily.  Discussed the need to take her medication daily.  Low cholesterol diet and exercise.  Follow lipid panel.       Hypertension    Blood pressure under good control.  Continue same medication regimen.  Follow pressures.  Follow metabolic panel.        Peripheral vascular disease ([place])    Followed by Dr Lucky Cowboy.        Sinusitis    Symptoms as outlined.  States c/w her previous sinus infection.  Two weeks and symptoms worsening.  Treat with amoxicillin as directed.  Continue saline nasal flushes.        Relevant Medications   amoxicillin (AMOXIL) 875 MG tablet   Vaginitis    Discharge present.  KOH/wet prep sent.        Other Visit Diagnoses    Pap smear for cervical cancer screening    -  Primary    Relevant Orders    Cytology - PAP    Vaginal discharge        Relevant Orders    WET PREP FOR [place], YEAST, CLUE    Colon cancer screening        Relevant Orders    Ambulatory referral to General Surgery        Einar Pheasant, MD

## 2015-08-05 NOTE — Progress Notes (Signed)
Pre-visit discussion using our clinic review tool. No additional management support is needed unless otherwise documented below in the visit note.  

## 2015-08-06 ENCOUNTER — Encounter: Payer: Self-pay | Admitting: Internal Medicine

## 2015-08-06 DIAGNOSIS — J329 Chronic sinusitis, unspecified: Secondary | ICD-10-CM | POA: Insufficient documentation

## 2015-08-06 DIAGNOSIS — N760 Acute vaginitis: Secondary | ICD-10-CM | POA: Insufficient documentation

## 2015-08-06 NOTE — Assessment & Plan Note (Signed)
Followed by Dr Megan Salon.  Spasms better.  Continue risk factor modification.

## 2015-08-06 NOTE — Assessment & Plan Note (Signed)
Blood pressure under good control.  Continue same medication regimen.  Follow pressures.  Follow metabolic panel.   

## 2015-08-06 NOTE — Assessment & Plan Note (Signed)
Supposed to be taking pravastatin 80mg  per day now.  She is unsure dose taking and is not taking daily.  Discussed the need to take her medication daily.  Low cholesterol diet and exercise.  Follow lipid panel.

## 2015-08-06 NOTE — Assessment & Plan Note (Signed)
Followed by Dr Lucky Cowboy.  When she sees him next visit, going to discuss carotid ultrasound.

## 2015-08-06 NOTE — Assessment & Plan Note (Signed)
Discharge present.  KOH/wet prep sent.

## 2015-08-06 NOTE — Assessment & Plan Note (Signed)
Symptoms as outlined.  States c/w her previous sinus infection.  Two weeks and symptoms worsening.  Treat with amoxicillin as directed.  Continue saline nasal flushes.

## 2015-08-06 NOTE — Assessment & Plan Note (Signed)
Followed by Dr Dew.   

## 2015-08-06 NOTE — Assessment & Plan Note (Addendum)
Physical today 08/05/15.  Declines mammogram.  Have discussed the need for colonoscopy.  Refer to Dr Bary Castilla.

## 2015-08-07 ENCOUNTER — Encounter: Payer: Self-pay | Admitting: General Surgery

## 2015-08-08 LAB — CYTOLOGY - PAP

## 2015-08-09 ENCOUNTER — Encounter: Payer: Self-pay | Admitting: *Deleted

## 2015-08-14 LAB — WET PREP BY MOLECULAR PROBE
Candida species: NEGATIVE
GARDNERELLA VAGINALIS: NEGATIVE
Trichomonas vaginosis: NEGATIVE

## 2015-08-20 ENCOUNTER — Other Ambulatory Visit: Payer: Self-pay

## 2015-08-20 NOTE — Telephone Encounter (Signed)
Please advise for refill, last done in 2016. thanks

## 2015-08-21 MED ORDER — ALPRAZOLAM 0.5 MG PO TABS: 0.5000 mg | ORAL_TABLET | Freq: Every day | ORAL | Status: DC | PRN

## 2015-08-21 NOTE — Telephone Encounter (Signed)
Sent and confirmed a call to pharmacy, thanks

## 2015-08-21 NOTE — Telephone Encounter (Signed)
ok'd refill xanax #[phone] with no refills.

## 2015-08-29 ENCOUNTER — Telehealth: Payer: Self-pay | Admitting: Internal Medicine

## 2015-08-29 ENCOUNTER — Ambulatory Visit: Payer: Medicare Other | Admitting: General Surgery

## 2015-09-03 ENCOUNTER — Ambulatory Visit (INDEPENDENT_AMBULATORY_CARE_PROVIDER_SITE_OTHER): Payer: Medicare Other | Admitting: Family Medicine

## 2015-09-03 ENCOUNTER — Telehealth: Payer: Self-pay | Admitting: Internal Medicine

## 2015-09-03 ENCOUNTER — Encounter: Payer: Self-pay | Admitting: Internal Medicine

## 2015-09-03 DIAGNOSIS — I2510 Atherosclerotic heart disease of native coronary artery without angina pectoris: Secondary | ICD-10-CM

## 2015-09-03 DIAGNOSIS — J019 Acute sinusitis, unspecified: Secondary | ICD-10-CM | POA: Insufficient documentation

## 2015-09-03 DIAGNOSIS — J0100 Acute maxillary sinusitis, unspecified: Secondary | ICD-10-CM

## 2015-09-03 MED ORDER — DOXYCYCLINE HYCLATE 100 MG PO TABS: 100.0000 mg | ORAL_TABLET | Freq: Two times a day (BID) | ORAL | 0 refills | Status: DC

## 2015-09-03 NOTE — Assessment & Plan Note (Signed)
History & exam consistent with sinusitis. Treating with doxycycline. Follow up closely with PCP.

## 2015-09-03 NOTE — Progress Notes (Signed)
Subjective:  [name], female    DOB: 1950-08-25  Age: 65 y.o. MRN: [id number]  CC: Sinus issues  HPI:  65 year old female presents with the above complaint.  Patient was seen earlier in July and was started on treatment for sinusitis. Patient took a few days of medication and then stopped as she got better.  Patient reports that last week she developed recurrence of her symptoms. She has been experiencing severe sinus pressure and pain particularly left-sided. She reports associated post nasal drip. No associated fevers or chills. She has been taking the amoxicillin was prescribed previously without improvement. No known exacerbating factors. No other complaints this time.  Social Hx   Social History   Social History  . Marital status: Married    Spouse name: N/A  . Number of children: N/A  . Years of education: N/A   Social History Main Topics  . Smoking status: Former Smoker    Quit date: 09/03/2008  . Smokeless tobacco: Never Used  . Alcohol use 0.0 oz/week     Comment: once a year  . Drug use: No  . Sexual activity: Not on file   Other Topics Concern  . Not on file   Social History Narrative  . No narrative on file   Review of Systems  Constitutional: Positive for fatigue. Negative for fever.  HENT: Positive for congestion, postnasal drip and sinus pressure.    Objective:  BP 96/62   Pulse 87   Temp 97.9 F (36.6 C) (Oral)   Wt 154 lb (69.9 kg)   SpO2 96%   BMI 24.12 kg/m   BP/Weight 09/03/2015 08/05/2015 123XX123  Systolic BP 96 [id number]  Diastolic BP 62 80 60  Wt. (Lbs) 154 155.38 165  BMI 24.12 24.33 26.24   Physical Exam  Constitutional: She appears well-developed. No distress.  HENT:  Head: Normocephalic and atraumatic.  Mouth/Throat: Oropharynx is clear and moist.  Significant maxillary sinus tenderness to palpation particularly the left. Normal TMs bilaterally.  Cardiovascular: Normal rate and regular rhythm.   2/6  systolic murmur.  Pulmonary/Chest: Effort normal and breath sounds normal.  Lymphadenopathy:    She has no cervical adenopathy.  Vitals reviewed.  Lab Results  Component Value Date   WBC 4.8 07/12/2013   HGB 12.6 07/12/2013   HCT 38.0 07/12/2013   PLT 192.0 07/12/2013   GLUCOSE 114 (H) 05/24/2014   CHOL 280 (H) 05/24/2014   TRIG 250.0 (H) 05/24/2014   HDL 43.90 05/24/2014   LDLDIRECT 200.0 05/24/2014   LDLCALC 107 (H) 07/12/2013   ALT 17 05/24/2014   AST 19 05/24/2014   NA 140 05/24/2014   K 4.2 05/24/2014   CL 104 05/24/2014   CREATININE 0.99 05/24/2014   BUN 9 05/24/2014   CO2 29 05/24/2014   TSH 1.32 05/24/2014    Assessment & Plan:   Problem List Items Addressed This Visit    Acute sinusitis    History & exam consistent with sinusitis. Treating with doxycycline. Follow up closely with PCP.      Relevant Medications   doxycycline (VIBRA-TABS) 100 MG tablet    Other Visit Diagnoses   None.    Meds ordered this encounter  Medications  . doxycycline (VIBRA-TABS) 100 MG tablet    Sig: Take 1 tablet (100 mg total) by mouth 2 (two) times daily.    Dispense:  20 tablet    Refill:  0   Follow-up: PRN  Thersa Salt DO [facility]  [place]

## 2015-09-03 NOTE — Progress Notes (Signed)
Pre visit review using our clinic review tool, if applicable. No additional management support is needed unless otherwise documented below in the visit note. 

## 2015-09-03 NOTE — Telephone Encounter (Signed)
Placed in yellow folder.  

## 2015-09-03 NOTE — Patient Instructions (Signed)
Take the Doxycycline as prescribed.  Follow up closely with Dr. Nicki Reaper.  Take care  Dr. Lacinda Axon    Sinusitis, Adult Sinusitis is redness, soreness, and inflammation of the paranasal sinuses. Paranasal sinuses are air pockets within the bones of your face. They are located beneath your eyes, in the middle of your forehead, and above your eyes. In healthy paranasal sinuses, mucus is able to drain out, and air is able to circulate through them by way of your nose. However, when your paranasal sinuses are inflamed, mucus and air can become trapped. This can allow bacteria and other germs to grow and cause infection. Sinusitis can develop quickly and last only a short time (acute) or continue over a long period (chronic). Sinusitis that lasts for more than 12 weeks is considered chronic. CAUSES Causes of sinusitis include:  Allergies.  Structural abnormalities, such as displacement of the cartilage that separates your nostrils (deviated septum), which can decrease the air flow through your nose and sinuses and affect sinus drainage.  Functional abnormalities, such as when the small hairs (cilia) that line your sinuses and help remove mucus do not work properly or are not present. SIGNS AND SYMPTOMS Symptoms of acute and chronic sinusitis are the same. The primary symptoms are pain and pressure around the affected sinuses. Other symptoms include:  Upper toothache.  Earache.  Headache.  Bad breath.  Decreased sense of smell and taste.  A cough, which worsens when you are lying flat.  Fatigue.  Fever.  Thick drainage from your nose, which often is green and may contain pus (purulent).  Swelling and warmth over the affected sinuses. DIAGNOSIS Your health care provider will perform a physical exam. During your exam, your health care provider may perform any of the following to help determine if you have acute sinusitis or chronic sinusitis:  Look in your nose for signs of abnormal  growths in your nostrils (nasal polyps).  Tap over the affected sinus to check for signs of infection.  View the inside of your sinuses using an imaging device that has a light attached (endoscope). If your health care provider suspects that you have chronic sinusitis, one or more of the following tests may be recommended:  Allergy tests.  Nasal culture. A sample of mucus is taken from your nose, sent to a lab, and screened for bacteria.  Nasal cytology. A sample of mucus is taken from your nose and examined by your health care provider to determine if your sinusitis is related to an allergy. TREATMENT Most cases of acute sinusitis are related to a viral infection and will resolve on their own within 10 days. Sometimes, medicines are prescribed to help relieve symptoms of both acute and chronic sinusitis. These may include pain medicines, decongestants, nasal steroid sprays, or saline sprays. However, for sinusitis related to a bacterial infection, your health care provider will prescribe antibiotic medicines. These are medicines that will help kill the bacteria causing the infection. Rarely, sinusitis is caused by a fungal infection. In these cases, your health care provider will prescribe antifungal medicine. For some cases of chronic sinusitis, surgery is needed. Generally, these are cases in which sinusitis recurs more than 3 times per year, despite other treatments. HOME CARE INSTRUCTIONS  Drink plenty of water. Water helps thin the mucus so your sinuses can drain more easily.  Use a humidifier.  Inhale steam 3-4 times a day (for example, sit in the bathroom with the shower running).  Apply a warm, moist washcloth to  your face 3-4 times a day, or as directed by your health care provider.  Use saline nasal sprays to help moisten and clean your sinuses.  Take medicines only as directed by your health care provider.  If you were prescribed either an antibiotic or antifungal medicine,  finish it all even if you start to feel better. SEEK IMMEDIATE MEDICAL CARE IF:  You have increasing pain or severe headaches.  You have nausea, vomiting, or drowsiness.  You have swelling around your face.  You have vision problems.  You have a stiff neck.  You have difficulty breathing.   This information is not intended to replace advice given to you by your health care provider. Make sure you discuss any questions you have with your health care provider.   Document Released: 01/19/2005 Document Revised: 02/09/2014 Document Reviewed: 02/03/2011 Elsevier Interactive Patient Education [employer].

## 2015-09-03 NOTE — Telephone Encounter (Signed)
Pt dropped off Lab results. I have placed them in Dr. Bary Leriche file up front.

## 2015-09-04 NOTE — Telephone Encounter (Signed)
Notify pt that I reviewed her lab (cholesterol results).  Cholesterol elevated above goal (especially given her history).  Confirm she is not taking any cholesterol medication regularly now.  If no, then start crestor 20mg  q day. (if has had no problems with crestor).   Will need liver panel checked 6 weeks after starting.

## 2015-09-09 NOTE — Telephone Encounter (Signed)
Pt notified of lab results & she currently takes Red yeast rice & Krill oil. She would like to hold off on starting cholesterol medication until she sees her cardiologist (Dr. Megan Salon) next month.

## 2015-09-18 ENCOUNTER — Encounter: Payer: Self-pay | Admitting: General Surgery

## 2015-09-19 ENCOUNTER — Encounter: Payer: Self-pay | Admitting: General Surgery

## 2015-09-19 ENCOUNTER — Ambulatory Visit (INDEPENDENT_AMBULATORY_CARE_PROVIDER_SITE_OTHER): Payer: Medicare Other | Admitting: General Surgery

## 2015-09-19 VITALS — BP 116/64 | HR 78 | Resp 14 | Ht 67.0 in | Wt 155.0 lb

## 2015-09-19 DIAGNOSIS — Z1211 Encounter for screening for malignant neoplasm of colon: Secondary | ICD-10-CM

## 2015-09-19 DIAGNOSIS — I2510 Atherosclerotic heart disease of native coronary artery without angina pectoris: Secondary | ICD-10-CM

## 2015-09-19 MED ORDER — POLYETHYLENE GLYCOL 3350 17 GM/SCOOP PO POWD: 1.0000 | Freq: Once | ORAL | 0 refills | Status: AC

## 2015-09-19 NOTE — Progress Notes (Signed)
[name], female   DOB: 1950/03/12, 65 y.o.   MRN: [id number]  Chief Complaint  Patient presents with  . Colonoscopy    HPI Jennifer [name] is a 65 y.o. female here today for a evaluation of a screening colonoscopy. Patient states no GI problems at this time. Bowels move daily, no bleeding. No prior colonoscopy. No family history of colon cancer or polyps.  No recurrent coronary symptoms.  Peripheral vascular symptoms have been stable.  HPI  Past Medical History:  Diagnosis Date  . CAD (coronary artery disease) 2003   s/p CABG x 5 (Dr Evelina Dun), [place]- 2010  . Endometriosis   . Hypercholesteremia   . Hypertension   . PVD (peripheral vascular disease) ([place])    s/p intervention (left) - 2004, right (2006)    Past Surgical History:  Procedure Laterality Date  . ABDOMINAL HYSTERECTOMY    . BREAST SURGERY  2004   cyst removed  . cataract surgery  2007   left  . CORONARY ARTERY BYPASS GRAFT     2003    Family History  Problem Relation Age of Onset  . Heart disease Mother   . CVA Mother   . Heart disease Father     CABG  . Heart disease      aunts and uncles  . Breast cancer Neg Hx   . Colon cancer Neg Hx     Social History Social History  Substance Use Topics  . Smoking status: Former Smoker    Quit date: 09/03/2008  . Smokeless tobacco: Never Used  . Alcohol use 0.0 oz/week     Comment: once a year    Allergies  Allergen Reactions  . Cyclobenzaprine Other (See Comments)    Felt bad  . Prednisone Other (See Comments)    Felt bad    Current Outpatient Prescriptions  Medication Sig Dispense Refill  . ALPRAZolam (XANAX) 0.5 MG tablet Take 1 tablet (0.5 mg total) by mouth daily as needed for sleep. 90 tablet 0  . Ascorbic Acid (VITAMIN C) 1000 MG tablet Take 2,000-3,000 mg by mouth daily.    . Bromelains 500 MG TABS Take 500 mg by mouth daily.    . carvedilol (COREG) 3.125 MG tablet Take 1 tablet (3.125 mg total) by mouth 2 (two)  times daily with a meal. 90 tablet 1  . Cholecalciferol (D3 MAXIMUM STRENGTH) 5000 units capsule Take 10,000 Units by mouth daily.    . clopidogrel (PLAVIX) 75 MG tablet Take 1 tablet (75 mg total) by mouth daily. 90 tablet 3  . Coenzyme Q10 (CO Q 10) 100 MG CAPS Take 100 mg by mouth daily.    . Cromolyn Sodium (NASAL ALLERGY NA) Place into the nose daily. X Clear    . diphenhydrAMINE (BENADRYL) 25 MG tablet Take 1 tablet (25 mg total) by mouth every 6 (six) hours. 20 tablet 0  . furosemide (LASIX) 40 MG tablet Take 40 mg by mouth 2 (two) times daily.    . Garlic 123XX123 MG CAPS Take 1,000 mg by mouth daily.    . isosorbide mononitrate (IMDUR) 60 MG 24 hr tablet Take 60 mg by mouth 2 (two) times daily. 120mg -am, 60mg -pm    . KRILL OIL PO Take 2,000 mg by mouth daily.     Marland Kitchen losartan (COZAAR) 25 MG tablet Take 12.5 mg by mouth daily.    . Magnesium 250 MG TABS Take by mouth. Take 2 per day    . nitroGLYCERIN (NITROSTAT)  0.4 MG SL tablet Place 1 tablet (0.4 mg total) under the tongue every 5 (five) minutes as needed for chest pain. 50 tablet 3  . OIL OF OREGANO PO Take 1,920 mg by mouth daily.    . potassium chloride (K-DUR) 10 MEQ tablet Take 20 mEq by mouth daily.     Marland Kitchen QUERCETIN PO Take 500 mg by mouth daily.    Marland Kitchen spironolactone (ALDACTONE) 25 MG tablet Take 12.5 mg by mouth once.     . TURMERIC PO Take 448 mg by mouth daily.     . vitamin E (VITAMIN E) 400 UNIT capsule Take 400 Units by mouth daily.     No current facility-administered medications for this visit.     Review of Systems Review of Systems  Constitutional: Negative.   Respiratory: Negative.   Cardiovascular: Negative.   Gastrointestinal: Negative.     Blood pressure 116/64, pulse 78, resp. rate 14, height 5\' 7"  (1.702 m), weight 155 lb (70.3 kg).  Physical Exam Physical Exam  Constitutional: She is oriented to person, place, and time. She appears well-developed and well-nourished.  HENT:  Mouth/Throat: Oropharynx is  clear and moist.  Eyes: Conjunctivae are normal. No scleral icterus.  Neck: Neck supple.  Cardiovascular: Normal rate, regular rhythm and normal heart sounds.   Pulmonary/Chest: Effort normal and breath sounds normal.  Lymphadenopathy:    She has no cervical adenopathy.  Neurological: She is alert and oriented to person, place, and time.  Skin: Skin is warm and dry.  Psychiatric: Her behavior is normal.    Data Reviewed Laboratory studies dated 05/24/2014 showed normal renal function with a creatinine of 1.0 Estimated GFR greater than 60.  Assessment    Candidate for screening colonoscopy.    Plan        Stop Plavix for 1 week prior to colonoscopy and take 81 mg aspirin. Stop krill oil, coconut oil and fish oil as well one week before.  Colonoscopy with possible biopsy/polypectomy prn: Information regarding the procedure, including its potential risks and complications (including but not limited to perforation of the bowel, which may require emergency surgery to repair, and bleeding) was verbally given to the patient. Educational information regarding lower intestinal endoscopy was given to the patient. Written instructions for how to complete the bowel prep using Miralax were provided. The importance of drinking ample fluids to avoid dehydration as a result of the prep emphasized.  The patient is scheduled for a Colonoscopy at [facility] on 10/30/15. They are aware to call the day before to get their arrival time. She will stop Plavix for 1 week prior to colonoscopy and take 81 mg aspirin. She will also stop krill oil, coconut oil and fish oil as well one week before. She will only take her blood pressure medication at 6 am with a small sip of water the morning of. Miralax prescription has been sent into the patient's pharmacy. The patient is aware of date and instructions.  This information has been scribed by Karie Fetch RN, BSN,BC.  Jennifer [name] 09/21/2015, 6:22 AM

## 2015-09-19 NOTE — Patient Instructions (Addendum)
Stop Plavix for 1 week prior to colonoscopy and take aspirin. Stop krill oil , coconut oil and fish oil as well one week before.  Colonoscopy A colonoscopy is an exam to look at the entire large intestine (colon). This exam can help find problems such as tumors, polyps, inflammation, and areas of bleeding. The exam takes about 1 hour.  LET [facility] CARE PROVIDER KNOW ABOUT:   Any allergies you have.  All medicines you are taking, including vitamins, herbs, eye drops, creams, and over-the-counter medicines.  Previous problems you or members of your family have had with the use of anesthetics.  Any blood disorders you have.  Previous surgeries you have had.  Medical conditions you have. RISKS AND COMPLICATIONS  Generally, this is a safe procedure. However, as with any procedure, complications can occur. Possible complications include:  Bleeding.  Tearing or rupture of the colon wall.  Reaction to medicines given during the exam.  Infection (rare). BEFORE THE PROCEDURE   Ask your health care provider about changing or stopping your regular medicines.  You may be prescribed an oral bowel prep. This involves drinking a large amount of medicated liquid, starting the day before your procedure. The liquid will cause you to have multiple loose stools until your stool is almost clear or light green. This cleans out your colon in preparation for the procedure.  Do not eat or drink anything else once you have started the bowel prep, unless your health care provider tells you it is safe to do so.  Arrange for someone to drive you home after the procedure. PROCEDURE   You will be given medicine to help you relax (sedative).  You will lie on your side with your knees bent.  A long, flexible tube with a light and camera on the end (colonoscope) will be inserted through the rectum and into the colon. The camera sends video back to a computer screen as it moves through the colon. The  colonoscope also releases carbon dioxide gas to inflate the colon. This helps your health care provider see the area better.  During the exam, your health care provider may take a small tissue sample (biopsy) to be examined under a microscope if any abnormalities are found.  The exam is finished when the entire colon has been viewed. AFTER THE PROCEDURE   Do not drive for 24 hours after the exam.  You may have a small amount of blood in your stool.  You may pass moderate amounts of gas and have mild abdominal cramping or bloating. This is caused by the gas used to inflate your colon during the exam.  Ask when your test results will be ready and how you will get your results. Make sure you get your test results.   This information is not intended to replace advice given to you by your health care provider. Make sure you discuss any questions you have with your health care provider.   Document Released: 01/17/2000 Document Revised: 11/09/2012 Document Reviewed: 09/26/2012 Elsevier Interactive Patient Education [employer].  The patient is scheduled for a Colonoscopy at [facility] on 10/30/15. They are aware to call the day before to get their arrival time. She will stop Plavix for 1 week prior to colonoscopy and take 81 mg aspirin. She will also stop krill oil, coconut oil and fish oil as well one week before. She will only take her blood pressure medication at 6 am with a small sip of water the morning of.  Miralax prescription has been sent into the patient's pharmacy. The patient is aware of date and instructions.

## 2015-09-21 DIAGNOSIS — Z1211 Encounter for screening for malignant neoplasm of colon: Secondary | ICD-10-CM | POA: Insufficient documentation

## 2015-10-23 DIAGNOSIS — I25709 Atherosclerosis of coronary artery bypass graft(s), unspecified, with unspecified angina pectoris: Secondary | ICD-10-CM | POA: Diagnosis not present

## 2015-10-29 ENCOUNTER — Telehealth: Payer: Self-pay

## 2015-10-29 NOTE — Telephone Encounter (Signed)
Patient called with questions about her supplements she has been taking. Instructed to hold these the day before her colonoscopy. She may continue all her regular medications except for those she has been instructed to stop. She is amendable to this.

## 2015-10-30 ENCOUNTER — Ambulatory Visit
Admission: RE | Admit: 2015-10-30 | Discharge: 2015-10-30 | Disposition: A | Discharge status: Home / Self Care | Payer: Medicare Other | Source: Ambulatory Visit | Attending: General Surgery | Admitting: General Surgery

## 2015-10-30 ENCOUNTER — Encounter: Payer: Self-pay | Admitting: *Deleted

## 2015-10-30 ENCOUNTER — Ambulatory Visit: Payer: Medicare Other | Admitting: *Deleted

## 2015-10-30 ENCOUNTER — Encounter
Admission: RE | Disposition: A | Discharge status: Home / Self Care | Payer: Self-pay | Source: Ambulatory Visit | Attending: General Surgery

## 2015-10-30 DIAGNOSIS — E7800 Pure hypercholesterolemia, unspecified: Secondary | ICD-10-CM | POA: Insufficient documentation

## 2015-10-30 DIAGNOSIS — Z951 Presence of aortocoronary bypass graft: Secondary | ICD-10-CM | POA: Diagnosis not present

## 2015-10-30 DIAGNOSIS — Z888 Allergy status to other drugs, medicaments and biological substances status: Secondary | ICD-10-CM | POA: Insufficient documentation

## 2015-10-30 DIAGNOSIS — K5790 Diverticulosis of intestine, part unspecified, without perforation or abscess without bleeding: Secondary | ICD-10-CM | POA: Diagnosis not present

## 2015-10-30 DIAGNOSIS — Z9842 Cataract extraction status, left eye: Secondary | ICD-10-CM | POA: Insufficient documentation

## 2015-10-30 DIAGNOSIS — M722 Plantar fascial fibromatosis: Secondary | ICD-10-CM | POA: Insufficient documentation

## 2015-10-30 DIAGNOSIS — D120 Benign neoplasm of cecum: Secondary | ICD-10-CM | POA: Diagnosis not present

## 2015-10-30 DIAGNOSIS — K621 Rectal polyp: Secondary | ICD-10-CM | POA: Insufficient documentation

## 2015-10-30 DIAGNOSIS — Z1211 Encounter for screening for malignant neoplasm of colon: Secondary | ICD-10-CM

## 2015-10-30 DIAGNOSIS — I252 Old myocardial infarction: Secondary | ICD-10-CM | POA: Insufficient documentation

## 2015-10-30 DIAGNOSIS — N809 Endometriosis, unspecified: Secondary | ICD-10-CM | POA: Insufficient documentation

## 2015-10-30 DIAGNOSIS — K635 Polyp of colon: Secondary | ICD-10-CM | POA: Diagnosis not present

## 2015-10-30 DIAGNOSIS — I739 Peripheral vascular disease, unspecified: Secondary | ICD-10-CM | POA: Diagnosis not present

## 2015-10-30 DIAGNOSIS — I10 Essential (primary) hypertension: Secondary | ICD-10-CM | POA: Insufficient documentation

## 2015-10-30 DIAGNOSIS — I2510 Atherosclerotic heart disease of native coronary artery without angina pectoris: Secondary | ICD-10-CM | POA: Diagnosis not present

## 2015-10-30 DIAGNOSIS — Z90710 Acquired absence of both cervix and uterus: Secondary | ICD-10-CM | POA: Diagnosis not present

## 2015-10-30 DIAGNOSIS — D128 Benign neoplasm of rectum: Secondary | ICD-10-CM | POA: Diagnosis not present

## 2015-10-30 DIAGNOSIS — Z87891 Personal history of nicotine dependence: Secondary | ICD-10-CM | POA: Insufficient documentation

## 2015-10-30 DIAGNOSIS — D123 Benign neoplasm of transverse colon: Secondary | ICD-10-CM | POA: Insufficient documentation

## 2015-10-30 HISTORY — PX: COLONOSCOPY WITH PROPOFOL: SHX5780

## 2015-10-30 SURGERY — COLONOSCOPY WITH PROPOFOL
Anesthesia: General

## 2015-10-30 MED ORDER — SODIUM CHLORIDE 0.9 % IV SOLN: INTRAVENOUS | Status: DC

## 2015-10-30 MED ORDER — SODIUM CHLORIDE 0.9 % IV SOLN
INTRAVENOUS | Status: DC
  Administered 2015-10-30: 1000 mL via INTRAVENOUS

## 2015-10-30 MED ORDER — PROPOFOL 10 MG/ML IV BOLUS
INTRAVENOUS | Status: DC | PRN
Start: 2015-10-30 — End: 2015-10-30
  Administered 2015-10-30: 20 mg via INTRAVENOUS
  Administered 2015-10-30: 30 mg via INTRAVENOUS
  Administered 2015-10-30: 10 mg via INTRAVENOUS
  Administered 2015-10-30: 50 mg via INTRAVENOUS
  Administered 2015-10-30: 20 mg via INTRAVENOUS
  Administered 2015-10-30 (×3): 30 mg via INTRAVENOUS

## 2015-10-30 MED ORDER — CLOPIDOGREL BISULFATE 75 MG PO TABS: 75.0000 mg | ORAL_TABLET | Freq: Every day | ORAL | 3 refills | Status: AC

## 2015-10-30 MED ORDER — LIDOCAINE HCL (CARDIAC) 10 MG/ML IV SOLN
INTRAVENOUS | Status: DC | PRN
  Administered 2015-10-30: 50 mg via INTRAVENOUS

## 2015-10-30 MED ORDER — EPHEDRINE SULFATE 50 MG/ML IJ SOLN
INTRAMUSCULAR | Status: DC | PRN
  Administered 2015-10-30: 10 mg via INTRAVENOUS
  Administered 2015-10-30: 5 mg via INTRAVENOUS
  Administered 2015-10-30 (×3): 10 mg via INTRAVENOUS

## 2015-10-30 NOTE — Op Note (Signed)
[facility] Patient Name: Jennifer [name] Procedure Date: 10/30/2015 8:13 AM MRN: [id number] Account #: [id number] Date of Birth: 1950/07/03 Admit Type: Outpatient Age: 65 Room: [facility] ENDO ROOM 1 Gender: Female Note Status: Finalized Procedure:            Colonoscopy Indications:          Screening for colorectal malignant neoplasm Providers:            Robert Bellow, MD Referring MD:         Einar Pheasant, MD (Referring MD) Medicines:            Monitored Anesthesia Care Complications:        No immediate complications. Procedure:            Pre-Anesthesia Assessment:                       - Prior to the procedure, a History and Physical was                        performed, and patient medications, allergies and                        sensitivities were reviewed. The patient's tolerance of                        previous anesthesia was reviewed.                       - The risks and benefits of the procedure and the                        sedation options and risks were discussed with the                        patient. All questions were answered and informed                        consent was obtained.                       After obtaining informed consent, the colonoscope was                        passed under direct vision. Throughout the procedure,                        the patient's blood pressure, pulse, and oxygen                        saturations were monitored continuously. The                        Colonoscope was introduced through the anus and                        advanced to the the cecum, identified by appendiceal                        orifice and ileocecal valve. The colonoscopy was  performed without difficulty. The patient tolerated the                        procedure well. The quality of the bowel preparation                        was excellent. Findings:      Two sessile polyps were found in  the transverse colon and cecum. The       polyps were 5 mm in size. These were biopsied with a cold forceps for       histology.      Two sessile polyps were found in the rectum (benign-appearing lesion).       The polyps were 8 to 10 mm in size. These polyps were removed with a       cold snare. Resection and retrieval were complete.      The retroflexed view of the distal rectum and anal verge was normal and       showed no anal or rectal abnormalities.      A single medium-mouthed diverticulum was found in the sigmoid colon. Impression:           - Two 5 mm polyps in the transverse colon and in the                        cecum. Biopsied.                       - Two benign appearing 8 to 10 mm polyps in the rectum,                        removed with a cold snare. Resected and retrieved.                       - The distal rectum and anal verge are normal on                        retroflexion view. Recommendation:       - Telephone endoscopist for pathology results in 1 week. Procedure Code(s):    --- Professional ---                       [phone], Colonoscopy, flexible; with removal of tumor(s),                        polyp(s), or other lesion(s) by snare technique                       45380, 41, Colonoscopy, flexible; with biopsy, single                        or multiple Diagnosis Code(s):    --- Professional ---                       K62.1, Rectal polyp                       D12.0, Benign neoplasm of cecum                       D12.3, Benign neoplasm of transverse colon (hepatic  flexure or splenic flexure)                       Z12.11, Encounter for screening for malignant neoplasm                        of colon CPT copyright 2016 American Medical Association. All rights reserved. The codes documented in this report are preliminary and upon coder review may  be revised to meet current compliance requirements. Robert Bellow, MD 10/30/2015 8:58:38  AM This report has been signed electronically. Number of Addenda: 0 Note Initiated On: 10/30/2015 8:13 AM Scope Withdrawal Time: 0 hours 18 minutes 29 seconds  Total Procedure Duration: 0 hours 27 minutes 0 seconds       [facility]

## 2015-10-30 NOTE — Transfer of Care (Signed)
Immediate Anesthesia Transfer of Care Note  Patient: Jennifer [name]  Procedure(s) Performed: Procedure(s): COLONOSCOPY WITH PROPOFOL (N/A)  Patient Location: PACU  Anesthesia Type:General  Level of Consciousness: awake and alert   Airway & Oxygen Therapy: Patient Spontanous Breathing and Patient connected to nasal cannula oxygen  Post-op Assessment: Report given to RN and Post -op Vital signs reviewed and stable  Post vital signs: Reviewed and stable  Last Vitals:  Vitals:   10/30/15 0756  BP: (!) 124/59  Pulse: 86  Resp: 16  Temp: (!) 35.7 C    Last Pain: There were no vitals filed for this visit.       Complications: No apparent anesthesia complications

## 2015-10-30 NOTE — Anesthesia Postprocedure Evaluation (Signed)
Anesthesia Post Note  Patient: Jennifer [name]  Procedure(s) Performed: Procedure(s) (LRB): COLONOSCOPY WITH PROPOFOL (N/A)  Patient location during evaluation: PACU Anesthesia Type: General Level of consciousness: awake and alert and oriented Pain management: pain level controlled Vital Signs Assessment: post-procedure vital signs reviewed and stable Respiratory status: spontaneous breathing Cardiovascular status: blood pressure returned to baseline Anesthetic complications: no    Last Vitals:  Vitals:   10/30/15 0921 10/30/15 0931  BP: 102/66 97/61  Pulse: 70 72  Resp: 18 17  Temp:      Last Pain:  Vitals:   10/30/15 0931  TempSrc:   PainSc: 0-No pain                 Symir Mah

## 2015-10-30 NOTE — Anesthesia Preprocedure Evaluation (Addendum)
Anesthesia Evaluation  Patient identified by MRN, date of birth, ID band Patient awake    Reviewed: Allergy & Precautions, NPO status , Patient's Chart, lab work & pertinent test results, reviewed documented beta blocker date and time   Airway Mallampati: II       Dental no notable dental hx.    Pulmonary former smoker,    Pulmonary exam normal        Cardiovascular hypertension, Pt. on medications + CAD and + Peripheral Vascular Disease  Normal cardiovascular exam     Neuro/Psych negative neurological ROS  negative psych ROS   GI/Hepatic negative GI ROS, Neg liver ROS,   Endo/Other  negative endocrine ROS  Renal/GU negative Renal ROS  Female GU complaint     Musculoskeletal Plantar fascitis   Abdominal Normal abdominal exam  (+)   Peds negative pediatric ROS (+)  Hematology negative hematology ROS (+)   Anesthesia Other Findings endometriosis  Reproductive/Obstetrics                             Anesthesia Physical Anesthesia Plan  ASA: III  Anesthesia Plan: General   Post-op Pain Management:    Induction: Intravenous  Airway Management Planned: Nasal Cannula  Additional Equipment:   Intra-op Plan:   Post-operative Plan:   Informed Consent: I have reviewed the patients History and Physical, chart, labs and discussed the procedure including the risks, benefits and alternatives for the proposed anesthesia with the patient or authorized representative who has indicated his/her understanding and acceptance.   Dental advisory given  Plan Discussed with: CRNA and Surgeon  Anesthesia Plan Comments:         Anesthesia Quick Evaluation

## 2015-10-30 NOTE — H&P (Signed)
Jennifer [name] [id number] 1951/01/12     HPI: Candidate for screening colonoscopy. Tolerated prep well.   Prescriptions Prior to Admission  Medication Sig Dispense Refill Last Dose  . ALPRAZolam (XANAX) 0.5 MG tablet Take 1 tablet (0.5 mg total) by mouth daily as needed for sleep. 90 tablet 0 10/29/2015 at Unknown time  . Ascorbic Acid (VITAMIN C) 1000 MG tablet Take 2,000-3,000 mg by mouth daily.   Past Week at Unknown time  . Bromelains 500 MG TABS Take 500 mg by mouth daily.   Past Week at Unknown time  . carvedilol (COREG) 3.125 MG tablet Take 1 tablet (3.125 mg total) by mouth 2 (two) times daily with a meal. 90 tablet 1 10/30/2015 at 0600  . Cholecalciferol (D3 MAXIMUM STRENGTH) 5000 units capsule Take 10,000 Units by mouth daily.   Past Week at Unknown time  . clopidogrel (PLAVIX) 75 MG tablet Take 1 tablet (75 mg total) by mouth daily. 90 tablet 3 Past Week at Unknown time  . Coenzyme Q10 (CO Q 10) 100 MG CAPS Take 100 mg by mouth daily.   Past Week at Unknown time  . Cromolyn Sodium (NASAL ALLERGY NA) Place into the nose daily. X Clear   Past Week at Unknown time  . diphenhydrAMINE (BENADRYL) 25 MG tablet Take 1 tablet (25 mg total) by mouth every 6 (six) hours. 20 tablet 0 Past Week at Unknown time  . furosemide (LASIX) 40 MG tablet Take 40 mg by mouth 2 (two) times daily.   10/30/2015 at 0600  . Garlic [id number] MG CAPS Take 1,000 mg by mouth daily.   Past Week at Unknown time  . isosorbide mononitrate (IMDUR) 60 MG 24 hr tablet Take 60 mg by mouth 2 (two) times daily. 120mg -am, 60mg -pm   10/30/2015 at 0600  . KRILL OIL PO Take 2,000 mg by mouth daily.    Past Week at Unknown time  . losartan (COZAAR) 25 MG tablet Take 12.5 mg by mouth daily.   10/29/2015 at Unknown time  . Magnesium 250 MG TABS Take by mouth. Take 2 per day   Past Week at Unknown time  . nitroGLYCERIN (NITROSTAT) 0.4 MG SL tablet Place 1 tablet (0.4 mg total) under the tongue every 5 (five) minutes as needed for chest pain.  50 tablet 3 Past Week at Unknown time  . OIL OF OREGANO PO Take 1,920 mg by mouth daily.   Past Week at Unknown time  . potassium chloride (K-DUR) 10 MEQ tablet Take 20 mEq by mouth daily.    Past Week at Unknown time  . QUERCETIN PO Take 500 mg by mouth daily.   Past Week at Unknown time  . spironolactone (ALDACTONE) 25 MG tablet Take 12.5 mg by mouth once.    10/30/2015 at 0600  . TURMERIC PO Take 448 mg by mouth daily.    Past Week at Unknown time  . vitamin E (VITAMIN E) 400 UNIT capsule Take 400 Units by mouth daily.   Past Week at Unknown time   Allergies  Allergen Reactions  . Cyclobenzaprine Other (See Comments)    Felt bad  . Prednisone Other (See Comments)    Felt bad   Past Medical History:  Diagnosis Date  . CAD (coronary artery disease) 2003   s/p CABG x 5 (Dr Evelina Dun), [place]- 2010  . Endometriosis   . Hypercholesteremia   . Hypertension   . PVD (peripheral vascular disease) ([place])    s/p intervention (left) - 2004, right (  2006)   Past Surgical History:  Procedure Laterality Date  . ABDOMINAL HYSTERECTOMY    . BREAST SURGERY  2004   cyst removed  . cataract surgery  2007   left  . CORONARY ARTERY BYPASS GRAFT     2003   Social History   Social History  . Marital status: Married    Spouse name: N/A  . Number of children: N/A  . Years of education: N/A   Occupational History  . Not on file.   Social History Main Topics  . Smoking status: Former Smoker    Quit date: 09/03/2008  . Smokeless tobacco: Never Used  . Alcohol use 0.0 oz/week     Comment: once a year  . Drug use: No  . Sexual activity: Not on file   Other Topics Concern  . Not on file   Social History Narrative  . No narrative on file   Social History   Social History Narrative  . No narrative on file     ROS: Negative.     PE: HEENT: Negative. Lungs: Clear. Cardio: RR.  Assessment/Plan:  Proceed with planned endoscopy.  Robert Bellow 10/30/2015

## 2015-10-31 ENCOUNTER — Encounter: Payer: Self-pay | Admitting: General Surgery

## 2015-10-31 LAB — SURGICAL PATHOLOGY

## 2015-11-01 ENCOUNTER — Telehealth: Payer: Self-pay

## 2015-11-01 NOTE — Telephone Encounter (Signed)
-----   Message from Robert Bellow, MD sent at 10/31/2015  8:55 PM EDT ----- Please notify the patient of the polyps removed at the time of her recent endoscopy were benign. She should plan on a follow-up exam in 5 years. ----- Message ----- From: Interface, Lab In Three Zero One Sent: 10/31/2015   3:32 PM To: Robert Bellow, MD

## 2015-11-04 NOTE — Telephone Encounter (Signed)
Patient called back and is aware of her results.

## 2015-11-05 NOTE — Telephone Encounter (Signed)
Notified patient as instructed, patient pleased. Discussed follow-up appointments, patient agrees  

## 2015-12-16 ENCOUNTER — Other Ambulatory Visit (INDEPENDENT_AMBULATORY_CARE_PROVIDER_SITE_OTHER): Payer: Self-pay | Admitting: Vascular Surgery

## 2015-12-16 DIAGNOSIS — I739 Peripheral vascular disease, unspecified: Secondary | ICD-10-CM

## 2015-12-16 DIAGNOSIS — M79662 Pain in left lower leg: Secondary | ICD-10-CM

## 2015-12-16 DIAGNOSIS — I70213 Atherosclerosis of native arteries of extremities with intermittent claudication, bilateral legs: Secondary | ICD-10-CM

## 2015-12-16 DIAGNOSIS — M79661 Pain in right lower leg: Secondary | ICD-10-CM

## 2015-12-18 ENCOUNTER — Ambulatory Visit (INDEPENDENT_AMBULATORY_CARE_PROVIDER_SITE_OTHER): Payer: Medicare Other

## 2015-12-18 ENCOUNTER — Ambulatory Visit (INDEPENDENT_AMBULATORY_CARE_PROVIDER_SITE_OTHER): Payer: Medicare Other | Admitting: Vascular Surgery

## 2015-12-18 ENCOUNTER — Encounter (INDEPENDENT_AMBULATORY_CARE_PROVIDER_SITE_OTHER): Payer: Self-pay | Admitting: Vascular Surgery

## 2015-12-18 VITALS — BP 128/70 | HR 68 | Resp 16 | Ht 68.0 in | Wt 155.0 lb

## 2015-12-18 DIAGNOSIS — I739 Peripheral vascular disease, unspecified: Secondary | ICD-10-CM

## 2015-12-18 DIAGNOSIS — E7800 Pure hypercholesterolemia, unspecified: Secondary | ICD-10-CM

## 2015-12-18 DIAGNOSIS — I70213 Atherosclerosis of native arteries of extremities with intermittent claudication, bilateral legs: Secondary | ICD-10-CM | POA: Diagnosis not present

## 2015-12-18 DIAGNOSIS — I10 Essential (primary) hypertension: Secondary | ICD-10-CM

## 2015-12-18 DIAGNOSIS — M79661 Pain in right lower leg: Secondary | ICD-10-CM

## 2015-12-18 DIAGNOSIS — M79662 Pain in left lower leg: Secondary | ICD-10-CM

## 2015-12-18 NOTE — Progress Notes (Signed)
Subjective:    [name], female    DOB: 09-04-1950, 65 y.o.   MRN: [id number] Chief Complaint  Patient presents with  . Re-evaluation    6 month ABI   Patient presents for a six month lower extremity atherosclerotic disease follow up. The patient underwent an ABI which showed Right ABI: 0.63 and Left 0.67 (on 06/07/15, Right ABI: 0.83 and Left 0.83). A bilateral lower extremity arterial duplex is notable for Right: Chronic occlusion in the SFA, occlusion in distal PTA, multiple areas of narrowing with monophasic blood flow at ankle / LEFT: patent stent with biphasic at tibioperoneal trunk and monophasic distally at mid calf, occlusion of the PTA. The patient experiences minimal claudication like symptoms, however denies any rest pain or ulcers to her feet / toes.    Review of Systems  Constitutional: Negative.   HENT: Negative.   Eyes: Negative.   Respiratory: Negative.   Cardiovascular: Negative.   Gastrointestinal: Negative.   Endocrine: Negative.   Genitourinary: Negative.   Musculoskeletal: Negative.   Skin: Negative.   Allergic/Immunologic: Negative.   Neurological: Negative.   Hematological: Negative.   Psychiatric/Behavioral: Negative.       Objective:   Physical Exam  Constitutional: She is oriented to person, place, and time. She appears well-developed and well-nourished.  HENT:  Head: Normocephalic and atraumatic.  Right Ear: External ear normal.  Left Ear: External ear normal.  Eyes: Conjunctivae and EOM are normal. Pupils are equal, round, and reactive to light.  Neck: Normal range of motion.  Cardiovascular: Normal rate, regular rhythm, normal heart sounds and intact distal pulses.   Pulses:      Radial pulses are 2+ on the right side, and 2+ on the left side.       Dorsalis pedis pulses are 0 on the right side, and 1+ on the left side.       Posterior tibial pulses are 0 on the right side, and 1+ on the left side.  Pulmonary/Chest: Effort  normal and breath sounds normal.  Abdominal: Soft. Bowel sounds are normal.  Musculoskeletal: Normal range of motion. She exhibits no edema.  Neurological: She is alert and oriented to person, place, and time.  Skin: Skin is warm and dry.  Psychiatric: She has a normal mood and affect. Her behavior is normal. Judgment and thought content normal.   BP 128/70 (BP Location: Right Arm)   Pulse 68   Resp 16   Ht 5\' 8"  (1.727 m)   Wt 155 lb (70.3 kg)   BMI 23.57 kg/m   Past Medical History:  Diagnosis Date  . CAD (coronary artery disease) 2003   s/p CABG x 5 (Dr Evelina Dun), [place]- 2010  . Endometriosis   . Hypercholesteremia   . Hypertension   . PVD (peripheral vascular disease) ([place])    s/p intervention (left) - 2004, right (2006)   Social History   Social History  . Marital status: Married    Spouse name: N/A  . Number of children: N/A  . Years of education: N/A   Occupational History  . Not on file.   Social History Main Topics  . Smoking status: Former Smoker    Quit date: 09/03/2008  . Smokeless tobacco: Never Used  . Alcohol use 0.0 oz/week     Comment: once a year  . Drug use: No  . Sexual activity: Not on file   Other Topics Concern  . Not on file   Social History Narrative  .  No narrative on file   Past Surgical History:  Procedure Laterality Date  . ABDOMINAL HYSTERECTOMY    . BREAST SURGERY  2004   cyst removed  . cataract surgery  2007   left  . COLONOSCOPY WITH PROPOFOL N/A 10/30/2015   Procedure: COLONOSCOPY WITH PROPOFOL;  Surgeon: Robert Bellow, MD;  Location: [facility] ENDOSCOPY;  Service: Endoscopy;  Laterality: N/A;  . CORONARY ARTERY BYPASS GRAFT     2003   Family History  Problem Relation Age of Onset  . Heart disease Mother   . CVA Mother   . Heart disease Father     CABG  . Heart disease      aunts and uncles  . Breast cancer Neg Hx   . Colon cancer Neg Hx    Allergies  Allergen Reactions  . Cyclobenzaprine Other (See Comments)     Felt bad  . Prednisone Other (See Comments)    Felt bad      Assessment & Plan:  Patient presents for a six month lower extremity atherosclerotic disease follow up. The patient underwent an ABI which showed Right ABI: 0.63 and Left 0.67 (on 06/07/15, Right ABI: 0.83 and Left 0.83). A bilateral lower extremity arterial duplex is notable for Right: Chronic occlusion in the SFA, occlusion in distal PTA, multiple areas of narrowing with monophasic blood flow at ankle / LEFT: patent stent with biphasic at tibioperoneal trunk and monophasic distally at mid calf, occlusion of the PTA. The patient experiences minimal claudication like symptoms, however denies any rest pain or ulcers to her feet / toes.   1. Peripheral vascular disease (HCC) - Worsening Decrease in ABI and new stenosis / worsening on duplex, faint pedal pulses however patient continues to refuse any intervention at this time. Patient states minimal claudication symptoms.  Patient to remain abstinent of tobacco use. I have discussed with the patient at length the risk factors for and pathogenesis of atherosclerotic disease and encouraged a healthy diet, regular exercise regimen and blood pressure / glucose control.  The patient was encouraged to call the office in the interim if he experiences any claudication like symptoms, rest pain or ulcers to his feet / toes.  2. Hypercholesteremia - Stable I have discussed with the patient the addition of a statin and ASA and their role in the medical optimization of atherosclerotic disease. The patient expresses their understanding and will discuss the addition of these agenst with their internist  3. Essential hypertension - Stable Encouraged good control as its slows the progression of atherosclerotic disease   Current Outpatient Prescriptions on File Prior to Visit  Medication Sig Dispense Refill  . ALPRAZolam (XANAX) 0.5 MG tablet Take 1 tablet (0.5 mg total) by mouth daily as needed for sleep.  90 tablet 0  . Ascorbic Acid (VITAMIN C) 1000 MG tablet Take 2,000-3,000 mg by mouth daily.    . Bromelains 500 MG TABS Take 500 mg by mouth daily.    . carvedilol (COREG) 3.125 MG tablet Take 1 tablet (3.125 mg total) by mouth 2 (two) times daily with a meal. 90 tablet 1  . Cholecalciferol (D3 MAXIMUM STRENGTH) 5000 units capsule Take 10,000 Units by mouth daily.    . clopidogrel (PLAVIX) 75 MG tablet Take 1 tablet (75 mg total) by mouth daily. 90 tablet 3  . Coenzyme Q10 (CO Q 10) 100 MG CAPS Take 100 mg by mouth daily.    . Cromolyn Sodium (NASAL ALLERGY NA) Place into the nose daily. X  Clear    . diphenhydrAMINE (BENADRYL) 25 MG tablet Take 1 tablet (25 mg total) by mouth every 6 (six) hours. 20 tablet 0  . furosemide (LASIX) 40 MG tablet Take 40 mg by mouth 2 (two) times daily.    . Garlic 123XX123 MG CAPS Take 1,000 mg by mouth daily.    . isosorbide mononitrate (IMDUR) 60 MG 24 hr tablet Take 60 mg by mouth 2 (two) times daily. 120mg -am, 60mg -pm    . KRILL OIL PO Take 2,000 mg by mouth daily.     Marland Kitchen losartan (COZAAR) 25 MG tablet Take 12.5 mg by mouth daily.    . Magnesium 250 MG TABS Take by mouth. Take 2 per day    . nitroGLYCERIN (NITROSTAT) 0.4 MG SL tablet Place 1 tablet (0.4 mg total) under the tongue every 5 (five) minutes as needed for chest pain. 50 tablet 3  . OIL OF OREGANO PO Take 1,920 mg by mouth daily.    . potassium chloride (K-DUR) 10 MEQ tablet Take 20 mEq by mouth daily.     Marland Kitchen QUERCETIN PO Take 500 mg by mouth daily.    Marland Kitchen spironolactone (ALDACTONE) 25 MG tablet Take 12.5 mg by mouth once.     . TURMERIC PO Take 448 mg by mouth daily.     . vitamin E (VITAMIN E) 400 UNIT capsule Take 400 Units by mouth daily.     No current facility-administered medications on file prior to visit.     There are no Patient Instructions on file for this visit. No Follow-up on file.   Annaleigha Woo A Naria Abbey, PA-C

## 2016-01-30 ENCOUNTER — Telehealth: Payer: Self-pay | Admitting: Internal Medicine

## 2016-01-30 NOTE — Telephone Encounter (Signed)
Pt called and would like to have lab done before her appt on 05/05/16. She stated that she would like her inflammation and sugar checked. Please advise, thank you!  Call pt @ [phone]

## 2016-01-30 NOTE — Telephone Encounter (Signed)
I do not mind ordering labs prior to her appt, but is there a specific inflammation test she is wanting checked.  (I presume she is either talking about a sed rate or crp).  Also need to know reason wants checked so can have diagnosis for lab.  Thanks

## 2016-01-30 NOTE — Telephone Encounter (Signed)
Do you want patient to have labs before appointment .

## 2016-01-30 NOTE — Telephone Encounter (Signed)
LM for patient to return call.

## 2016-02-05 NOTE — Telephone Encounter (Signed)
LM for patient to return call.

## 2016-02-10 ENCOUNTER — Ambulatory Visit: Payer: Medicare (Managed Care) | Admitting: Internal Medicine

## 2016-04-21 DIAGNOSIS — I779 Disorder of arteries and arterioles, unspecified: Secondary | ICD-10-CM | POA: Diagnosis not present

## 2016-04-21 DIAGNOSIS — I25119 Atherosclerotic heart disease of native coronary artery with unspecified angina pectoris: Secondary | ICD-10-CM | POA: Diagnosis not present

## 2016-05-05 ENCOUNTER — Ambulatory Visit (INDEPENDENT_AMBULATORY_CARE_PROVIDER_SITE_OTHER): Payer: Medicare Other | Admitting: Internal Medicine

## 2016-05-05 ENCOUNTER — Encounter: Payer: Self-pay | Admitting: Internal Medicine

## 2016-05-05 VITALS — BP 110/70 | HR 77 | Temp 98.6°F | Resp 16 | Ht 68.0 in | Wt 163.6 lb

## 2016-05-05 DIAGNOSIS — I10 Essential (primary) hypertension: Secondary | ICD-10-CM

## 2016-05-05 DIAGNOSIS — E7800 Pure hypercholesterolemia, unspecified: Secondary | ICD-10-CM | POA: Diagnosis not present

## 2016-05-05 DIAGNOSIS — R0981 Nasal congestion: Secondary | ICD-10-CM

## 2016-05-05 DIAGNOSIS — R0989 Other specified symptoms and signs involving the circulatory and respiratory systems: Secondary | ICD-10-CM | POA: Diagnosis not present

## 2016-05-05 DIAGNOSIS — I739 Peripheral vascular disease, unspecified: Secondary | ICD-10-CM | POA: Diagnosis not present

## 2016-05-05 DIAGNOSIS — R739 Hyperglycemia, unspecified: Secondary | ICD-10-CM

## 2016-05-05 DIAGNOSIS — I2510 Atherosclerotic heart disease of native coronary artery without angina pectoris: Secondary | ICD-10-CM

## 2016-05-05 MED ORDER — ALPRAZOLAM 0.5 MG PO TABS: 0.5000 mg | ORAL_TABLET | Freq: Every day | ORAL | 0 refills | Status: DC | PRN

## 2016-05-05 NOTE — Progress Notes (Signed)
Pre-visit discussion using our clinic review tool. No additional management support is needed unless otherwise documented below in the visit note.  

## 2016-05-05 NOTE — Progress Notes (Signed)
[name], female   DOB: [birth year], 66 y.o.   MRN: [id number]   Subjective:    [name], female    DOB: 1950/07/25, 66 y.o.   MRN: [id number]  HPI  Patient here for a scheduled follow up.  She reports she is doing relatively well.  With increased leg pain with ambulation.  Being followed by vascular surgery.  Has f/u planned next month.  Going to sliver sneakers.  Stable from cardiac stand point.  Breathing stable.  No acid reflux.  No abdominal pain.  Bowels ok.  Handling stress.  Overall she feels things are stable.  Some sinus congestion.     Past Medical History:  Diagnosis Date  . CAD (coronary artery disease) 2003   s/p CABG x 5 (Dr Evelina Dun), [place]- 2010  . Endometriosis   . Hypercholesteremia   . Hypertension   . PVD (peripheral vascular disease) ([place])    s/p intervention (left) - 2004, right (2006)   Past Surgical History:  Procedure Laterality Date  . ABDOMINAL HYSTERECTOMY    . BREAST SURGERY  2004   cyst removed  . cataract surgery  2007   left  . COLONOSCOPY WITH PROPOFOL N/A 10/30/2015   Procedure: COLONOSCOPY WITH PROPOFOL;  Surgeon: Robert Bellow, MD;  Location: [facility] ENDOSCOPY;  Service: Endoscopy;  Laterality: N/A;  . CORONARY ARTERY BYPASS GRAFT     2003   Family History  Problem Relation Age of Onset  . Heart disease Mother   . CVA Mother   . Heart disease Father     CABG  . Heart disease      aunts and uncles  . Breast cancer Neg Hx   . Colon cancer Neg Hx    Social History   Social History  . Marital status: Married    Spouse name: N/A  . Number of children: N/A  . Years of education: N/A   Social History Main Topics  . Smoking status: Former Smoker    Quit date: 09/03/2008  . Smokeless tobacco: Never Used  . Alcohol use 0.0 oz/week     Comment: once a year  . Drug use: No  . Sexual activity: Not Asked   Other Topics Concern  . None   Social History Narrative  . None    Outpatient Encounter  Prescriptions as of 05/05/2016  Medication Sig  . ALPRAZolam (XANAX) 0.5 MG tablet Take 1 tablet (0.5 mg total) by mouth daily as needed for sleep.  . Ascorbic Acid (VITAMIN C) 1000 MG tablet Take 2,000-3,000 mg by mouth daily.  . Bromelains 500 MG TABS Take 500 mg by mouth daily.  . carvedilol (COREG) 3.125 MG tablet Take 1 tablet (3.125 mg total) by mouth 2 (two) times daily with a meal.  . Cholecalciferol (D3 MAXIMUM STRENGTH) 5000 units capsule Take 10,000 Units by mouth daily.  . clopidogrel (PLAVIX) 75 MG tablet Take 1 tablet (75 mg total) by mouth daily.  . Coenzyme Q10 (CO Q 10) 100 MG CAPS Take 100 mg by mouth daily.  . Cromolyn Sodium (NASAL ALLERGY NA) Place into the nose daily. X Clear  . diphenhydrAMINE (BENADRYL) 25 MG tablet Take 1 tablet (25 mg total) by mouth every 6 (six) hours.  . furosemide (LASIX) 40 MG tablet Take 40 mg by mouth 2 (two) times daily.  . Garlic 3790 MG CAPS Take 1,000 mg by mouth daily.  . isosorbide mononitrate (IMDUR) 60 MG 24 hr tablet Take 60  mg by mouth 2 (two) times daily. 120mg -am, 60mg -pm  . KRILL OIL PO Take 2,000 mg by mouth daily.   Marland Kitchen losartan (COZAAR) 25 MG tablet Take 12.5 mg by mouth daily.  . Magnesium 250 MG TABS Take by mouth. Take 2 per day  . nitroGLYCERIN (NITROSTAT) 0.4 MG SL tablet Place 1 tablet (0.4 mg total) under the tongue every 5 (five) minutes as needed for chest pain.  . OIL OF OREGANO PO Take 1,920 mg by mouth daily.  . potassium chloride (K-DUR) 10 MEQ tablet Take 20 mEq by mouth daily.   Marland Kitchen QUERCETIN PO Take 500 mg by mouth daily.  Marland Kitchen spironolactone (ALDACTONE) 25 MG tablet Take 12.5 mg by mouth once.   . TURMERIC PO Take 448 mg by mouth daily.   . vitamin E (VITAMIN E) 400 UNIT capsule Take 400 Units by mouth daily.  . [DISCONTINUED] ALPRAZolam (XANAX) 0.5 MG tablet Take 1 tablet (0.5 mg total) by mouth daily as needed for sleep.   No facility-administered encounter medications on file as of 05/05/2016.     Review of  Systems  Constitutional: Negative for appetite change and unexpected weight change.  HENT: Positive for congestion. Negative for sinus pressure.   Respiratory: Negative for cough, chest tightness and shortness of breath.   Cardiovascular: Negative for palpitations and leg swelling.       No increased chest pain.  Stable.    Gastrointestinal: Negative for abdominal pain, diarrhea, nausea and vomiting.  Genitourinary: Negative for difficulty urinating and dysuria.  Musculoskeletal: Negative for joint swelling and myalgias.  Skin: Negative for color change and rash.  Neurological: Negative for dizziness, light-headedness and headaches.  Psychiatric/Behavioral: Negative for agitation and dysphoric mood.       Objective:     Blood pressure rechecked by me:  128/82  Physical Exam  Constitutional: She appears well-developed and well-nourished. No distress.  HENT:  Nose: Nose normal.  Mouth/Throat: Oropharynx is clear and moist.  Neck: Neck supple. No thyromegaly present.  Cardiovascular: Normal rate and regular rhythm.   Pulmonary/Chest: Breath sounds normal. No respiratory distress. She has no wheezes.  Abdominal: Soft. Bowel sounds are normal. There is no tenderness.  Musculoskeletal: She exhibits no edema or tenderness.  Lymphadenopathy:    She has no cervical adenopathy.  Skin: No rash noted. No erythema.  Psychiatric: She has a normal mood and affect. Her behavior is normal.    BP 110/70 (BP Location: Left Arm, Patient Position: Sitting, Cuff Size: Normal)   Pulse 77   Temp 98.6 F (37 C) (Oral)   Resp 16   Ht 5\' 8"  (1.727 m)   Wt 163 lb 9.6 oz (74.2 kg)   SpO2 98%   BMI 24.88 kg/m  Wt Readings from Last 3 Encounters:  05/05/16 163 lb 9.6 oz (74.2 kg)  12/18/15 155 lb (70.3 kg)  10/30/15 150 lb (68 kg)     Lab Results  Component Value Date   WBC 4.8 07/12/2013   HGB 12.6 07/12/2013   HCT 38.0 07/12/2013   PLT 192.0 07/12/2013   GLUCOSE 114 (H) 05/24/2014    CHOL 230 (A) 07/24/2015   TRIG 181 (A) 07/24/2015   HDL 51 07/24/2015   LDLDIRECT 200.0 05/24/2014   LDLCALC 157 07/24/2015   ALT 17 05/24/2014   AST 19 05/24/2014   NA 140 05/24/2014   K 4.2 05/24/2014   CL 104 05/24/2014   CREATININE 0.99 05/24/2014   BUN 9 05/24/2014   CO2 29 05/24/2014  TSH 1.32 05/24/2014       Assessment & Plan:   Problem List Items Addressed This Visit    CAD (coronary artery disease)    Followed by Dr Megan Salon.  Overall stable.  Continue risk factor modification.  She declines cholesterol medication.        Carotid bruit    Followed by Dr Lucky Cowboy.        Hypercholesteremia    Intolerant to statin medication.  She declines.  Low cholesterol diet and exercise.  Follow lipid panel.        Relevant Orders   Hepatic function panel   Lipid panel   Hypertension    Blood pressure under good control.  Continue same medication regimen.  Follow pressures.  Follow metabolic panel.        Relevant Orders   TSH   CBC with Differential/Platelet   Basic metabolic panel   Peripheral vascular disease ([place])    Followed by [facility].  Due f/u next month.  Increased pain in her legs.         Other Visit Diagnoses    Sinus congestion    -  Primary   Saline nasal spray and steroid nasal spray as directed.  robitussin.  follow.  notify me if symptoms worsen. do not feel abx warranted at this time.     Hyperglycemia       Relevant Orders   Hemoglobin A1c       Einar Pheasant, MD

## 2016-05-10 ENCOUNTER — Encounter: Payer: Self-pay | Admitting: Internal Medicine

## 2016-05-10 NOTE — Assessment & Plan Note (Signed)
Intolerant to statin medication.  She declines.  Low cholesterol diet and exercise.  Follow lipid panel.

## 2016-05-10 NOTE — Assessment & Plan Note (Signed)
Followed by Dr Megan Salon.  Overall stable.  Continue risk factor modification.  She declines cholesterol medication.

## 2016-05-10 NOTE — Assessment & Plan Note (Signed)
Blood pressure under good control.  Continue same medication regimen.  Follow pressures.  Follow metabolic panel.   

## 2016-05-10 NOTE — Assessment & Plan Note (Signed)
Followed by [facility].  Due f/u next month.  Increased pain in her legs.

## 2016-05-10 NOTE — Assessment & Plan Note (Signed)
Followed by Dr Dew.   

## 2016-05-12 ENCOUNTER — Telehealth: Payer: Self-pay | Admitting: Internal Medicine

## 2016-05-12 ENCOUNTER — Other Ambulatory Visit (INDEPENDENT_AMBULATORY_CARE_PROVIDER_SITE_OTHER): Payer: Medicare Other

## 2016-05-12 DIAGNOSIS — E7800 Pure hypercholesterolemia, unspecified: Secondary | ICD-10-CM

## 2016-05-12 DIAGNOSIS — I10 Essential (primary) hypertension: Secondary | ICD-10-CM | POA: Diagnosis not present

## 2016-05-12 DIAGNOSIS — R739 Hyperglycemia, unspecified: Secondary | ICD-10-CM | POA: Diagnosis not present

## 2016-05-12 LAB — BASIC METABOLIC PANEL
BUN: 18 mg/dL (ref 6–23)
CHLORIDE: 100 meq/L (ref 96–112)
CO2: 31 meq/L (ref 19–32)
CREATININE: 0.89 mg/dL (ref 0.40–1.20)
Calcium: 10 mg/dL (ref 8.4–10.5)
GFR: 67.51 mL/min (ref >60.00)
GLUCOSE: 109 mg/dL — AB (ref 70–99)
Potassium: 4.3 mEq/L (ref 3.5–5.1)
Sodium: 138 mEq/L (ref 135–145)

## 2016-05-12 LAB — LIPID PANEL
CHOLESTEROL: 277 mg/dL — AB (ref 0–200)
HDL: 44.1 mg/dL (ref >39.00)
NONHDL: 233.14
TRIGLYCERIDES: 271 mg/dL — AB (ref 0.0–149.0)
Total CHOL/HDL Ratio: 6
VLDL: 54.2 mg/dL — ABNORMAL HIGH (ref 0.0–40.0)

## 2016-05-12 LAB — CBC WITH DIFFERENTIAL/PLATELET
BASOS ABS: 0.1 10*3/uL (ref 0.0–0.1)
BASOS PCT: 1.2 % (ref 0.0–3.0)
EOS ABS: 0.1 10*3/uL (ref 0.0–0.7)
Eosinophils Relative: 2.4 % (ref 0.0–5.0)
HCT: 40.9 % (ref 36.0–46.0)
HEMOGLOBIN: 14.1 g/dL (ref 12.0–15.0)
Lymphocytes Relative: 31.3 % (ref 12.0–46.0)
Lymphs Abs: 1.5 10*3/uL (ref 0.7–4.0)
MCHC: 34.4 g/dL (ref 30.0–36.0)
MCV: 90.4 fl (ref 78.0–100.0)
MONO ABS: 0.4 10*3/uL (ref 0.1–1.0)
Monocytes Relative: 7.5 % (ref 3.0–12.0)
Neutro Abs: 2.8 10*3/uL (ref 1.4–7.7)
Neutrophils Relative %: 57.6 % (ref 43.0–77.0)
PLATELETS: 226 10*3/uL (ref 150.0–400.0)
RBC: 4.53 Mil/uL (ref 3.87–5.11)
RDW: 14.2 % (ref 11.5–15.5)
WBC: 4.9 10*3/uL (ref 4.0–10.5)

## 2016-05-12 LAB — LDL CHOLESTEROL, DIRECT: Direct LDL: 178 mg/dL

## 2016-05-12 LAB — HEPATIC FUNCTION PANEL
ALBUMIN: 4.8 g/dL (ref 3.5–5.2)
ALT: 16 U/L (ref 0–35)
AST: 19 U/L (ref 0–37)
Alkaline Phosphatase: 57 U/L (ref 39–117)
BILIRUBIN DIRECT: 0.1 mg/dL (ref 0.0–0.3)
TOTAL PROTEIN: 7.2 g/dL (ref 6.0–8.3)
Total Bilirubin: 0.4 mg/dL (ref 0.2–1.2)

## 2016-05-12 LAB — TSH: TSH: 2.18 u[IU]/mL (ref 0.35–4.50)

## 2016-05-12 LAB — HEMOGLOBIN A1C: Hgb A1c MFr Bld: 5.9 % (ref 4.6–6.5)

## 2016-05-12 MED ORDER — DOXYCYCLINE HYCLATE 100 MG PO TABS: 100.0000 mg | ORAL_TABLET | Freq: Two times a day (BID) | ORAL | 0 refills | Status: DC

## 2016-05-12 NOTE — Telephone Encounter (Signed)
Called patient v/m not set up

## 2016-05-12 NOTE — Telephone Encounter (Signed)
Left message to return call to our office.  

## 2016-05-12 NOTE — Telephone Encounter (Signed)
I have sent in rx for doxycycline.  She is to take a probiotic daily while on the abx and for two weeks after completing the abx.  Let us know if persistent problems.

## 2016-05-12 NOTE — Telephone Encounter (Signed)
Was seen last week. Would like script for antibiotic. Patient having Sinus drainage, pressure in head and b/l ear pain. Some cough productive in the morning more than rest of day. She would like doxycycline.   [facility]

## 2016-05-12 NOTE — Telephone Encounter (Signed)
Pt was here for labs this morning. She saw Dr. Nicki Reaper last week. Dr. Nicki Reaper told her if sinus got worse to let her know and she would call her something in. Pt's number is [phone].

## 2016-05-13 ENCOUNTER — Telehealth: Payer: Self-pay | Admitting: Internal Medicine

## 2016-05-13 NOTE — Telephone Encounter (Signed)
Patient informed will call if any problems.  

## 2016-05-13 NOTE — Telephone Encounter (Signed)
Pt called office to say she did receive message.  Thank you.

## 2016-05-14 ENCOUNTER — Other Ambulatory Visit: Payer: Self-pay | Admitting: Internal Medicine

## 2016-05-14 DIAGNOSIS — E7800 Pure hypercholesterolemia, unspecified: Secondary | ICD-10-CM

## 2016-05-14 DIAGNOSIS — I2510 Atherosclerotic heart disease of native coronary artery without angina pectoris: Secondary | ICD-10-CM

## 2016-05-14 MED ORDER — ROSUVASTATIN CALCIUM 5 MG PO TABS: 5.0000 mg | ORAL_TABLET | Freq: Every day | ORAL | 3 refills | Status: DC

## 2016-05-14 NOTE — Progress Notes (Signed)
Order placed for liver test

## 2016-05-14 NOTE — Addendum Note (Signed)
Addended by: Francella Solian on: 05/14/2016 08:28 AM   Modules accepted: Orders

## 2016-06-19 ENCOUNTER — Ambulatory Visit (INDEPENDENT_AMBULATORY_CARE_PROVIDER_SITE_OTHER): Payer: Medicare Other

## 2016-06-19 ENCOUNTER — Encounter (INDEPENDENT_AMBULATORY_CARE_PROVIDER_SITE_OTHER): Payer: Self-pay

## 2016-06-19 ENCOUNTER — Encounter (INDEPENDENT_AMBULATORY_CARE_PROVIDER_SITE_OTHER): Payer: Self-pay | Admitting: Vascular Surgery

## 2016-06-19 ENCOUNTER — Ambulatory Visit (INDEPENDENT_AMBULATORY_CARE_PROVIDER_SITE_OTHER): Payer: Medicare Other | Admitting: Vascular Surgery

## 2016-06-19 VITALS — BP 154/79 | HR 70 | Resp 16 | Ht 67.0 in | Wt 159.0 lb

## 2016-06-19 DIAGNOSIS — I10 Essential (primary) hypertension: Secondary | ICD-10-CM

## 2016-06-19 DIAGNOSIS — I739 Peripheral vascular disease, unspecified: Secondary | ICD-10-CM

## 2016-06-19 DIAGNOSIS — I2510 Atherosclerotic heart disease of native coronary artery without angina pectoris: Secondary | ICD-10-CM | POA: Diagnosis not present

## 2016-06-19 DIAGNOSIS — E7800 Pure hypercholesterolemia, unspecified: Secondary | ICD-10-CM

## 2016-06-19 DIAGNOSIS — I70213 Atherosclerosis of native arteries of extremities with intermittent claudication, bilateral legs: Secondary | ICD-10-CM | POA: Diagnosis not present

## 2016-06-19 DIAGNOSIS — I70219 Atherosclerosis of native arteries of extremities with intermittent claudication, unspecified extremity: Secondary | ICD-10-CM | POA: Insufficient documentation

## 2016-06-19 NOTE — Assessment & Plan Note (Signed)
lipid control important in reducing the progression of atherosclerotic disease. Continue statin therapy  

## 2016-06-19 NOTE — Patient Instructions (Signed)
Peripheral Vascular Disease Peripheral vascular disease (PVD) is a disease of the blood vessels that are not part of your heart and brain. A simple term for PVD is poor circulation. In most cases, PVD narrows the blood vessels that carry blood from your heart to the rest of your body. This can result in a decreased supply of blood to your arms, legs, and internal organs, like your stomach or kidneys. However, it most often affects a person's lower legs and feet. There are two types of PVD.  Organic PVD. This is the more common type. It is caused by damage to the structure of blood vessels.  Functional PVD. This is caused by conditions that make blood vessels contract and tighten (spasm).  Without treatment, PVD tends to get worse over time. PVD can also lead to acute ischemic limb. This is when an arm or limb suddenly has trouble getting enough blood. This is a medical emergency. What are the causes? Each type of PVD has many different causes. The most common cause of PVD is buildup of a fatty material (plaque) inside of your arteries (atherosclerosis). Small amounts of plaque can break off from the walls of the blood vessels and become lodged in a smaller artery. This blocks blood flow and can cause acute ischemic limb. Other common causes of PVD include:  Blood clots that form inside of blood vessels.  Injuries to blood vessels.  Diseases that cause inflammation of blood vessels or cause blood vessel spasms.  Health behaviors and health history that increase your risk of developing PVD.  What increases the risk? You may have a greater risk of PVD if you:  Have a family history of PVD.  Have certain medical conditions, including: ? High cholesterol. ? Diabetes. ? High blood pressure (hypertension). ? Coronary heart disease. ? Past problems with blood clots. ? Past injury, such as burns or a broken bone. These may have damaged blood vessels in your limbs. ? Buerger disease. This is  caused by inflamed blood vessels in your hands and feet. ? Some forms of arthritis. ? Rare birth defects that affect the arteries in your legs.  Use tobacco.  Do not get enough exercise.  Are obese.  Are age 50 or older.  What are the signs or symptoms? PVD may cause many different symptoms. Your symptoms depend on what part of your body is not getting enough blood. Some common signs and symptoms include:  Cramps in your lower legs. This may be a symptom of poor leg circulation (claudication).  Pain and weakness in your legs while you are physically active that goes away when you rest (intermittent claudication).  Leg pain when at rest.  Leg numbness, tingling, or weakness.  Coldness in a leg or foot, especially when compared with the other leg.  Skin or hair changes. These can include: ? Hair loss. ? Shiny skin. ? Pale or bluish skin. ? Thick toenails.  Inability to get or maintain an erection (erectile dysfunction).  People with PVD are more prone to developing ulcers and sores on their toes, feet, or legs. These may take longer than normal to heal. How is this diagnosed? Your health care provider may diagnose PVD from your signs and symptoms. The health care provider will also do a physical exam. You may have tests to find out what is causing your PVD and determine its severity. Tests may include:  Blood pressure recordings from your arms and legs and measurements of the strength of your pulses (  pulse volume recordings).  Imaging studies using sound waves to take pictures of the blood flow through your blood vessels (Doppler ultrasound).  Injecting a dye into your blood vessels before having imaging studies using: ? X-rays (angiogram or arteriogram). ? Computer-generated X-rays (CT angiogram). ? A powerful electromagnetic field and a computer (magnetic resonance angiogram or MRA).  How is this treated? Treatment for PVD depends on the cause of your condition and the  severity of your symptoms. It also depends on your age. Underlying causes need to be treated and controlled. These include long-lasting (chronic) conditions, such as diabetes, high cholesterol, and high blood pressure. You may need to first try making lifestyle changes and taking medicines. Surgery may be needed if these do not work. Lifestyle changes may include:  Quitting smoking.  Exercising regularly.  Following a low-fat, low-cholesterol diet.  Medicines may include:  Blood thinners to prevent blood clots.  Medicines to improve blood flow.  Medicines to improve your blood cholesterol levels.  Surgical procedures may include:  A procedure that uses an inflated balloon to open a blocked artery and improve blood flow (angioplasty).  A procedure to put in a tube (stent) to keep a blocked artery open (stent implant).  Surgery to reroute blood flow around a blocked artery (peripheral bypass surgery).  Surgery to remove dead tissue from an infected wound on the affected limb.  Amputation. This is surgical removal of the affected limb. This may be necessary in cases of acute ischemic limb that are not improved through medical or surgical treatments.  Follow these instructions at home:  Take medicines only as directed by your health care provider.  Do not use any tobacco products, including cigarettes, chewing tobacco, or electronic cigarettes. If you need help quitting, ask your health care provider.  Lose weight if you are overweight, and maintain a healthy weight as directed by your health care provider.  Eat a diet that is low in fat and cholesterol. If you need help, ask your health care provider.  Exercise regularly. Ask your health care provider to suggest some good activities for you.  Use compression stockings or other mechanical devices as directed by your health care provider.  Take good care of your feet. ? Wear comfortable shoes that fit well. ? Check your feet  often for any cuts or sores. Contact a health care provider if:  You have cramps in your legs while walking.  You have leg pain when you are at rest.  You have coldness in a leg or foot.  Your skin changes.  You have erectile dysfunction.  You have cuts or sores on your feet that are not healing. Get help right away if:  Your arm or leg turns cold and blue.  Your arms or legs become red, warm, swollen, painful, or numb.  You have chest pain or trouble breathing.  You suddenly have weakness in your face, arm, or leg.  You become very confused or lose the ability to speak.  You suddenly have a very bad headache or lose your vision. This information is not intended to replace advice given to you by your health care provider. Make sure you discuss any questions you have with your health care provider. Document Released: 02/27/2004 Document Revised: 06/27/2015 Document Reviewed: 06/29/2013 Elsevier Interactive Patient Education  2017 [employer]  

## 2016-06-19 NOTE — Assessment & Plan Note (Addendum)
Stable, s/p CABG

## 2016-06-19 NOTE — Progress Notes (Signed)
MRN : [id number]  Jennifer [name] is a 66 y.o. ([birth year]) female who presents with chief complaint of  Chief Complaint  Patient presents with  . Re-evaluation    Ultrasound follow up  .  History of Present Illness: Patient returns today in follow up of PAD. She has noticed some increase in her right lower extremity claudication symptoms. She considered angiography but with some exercise and actually has gotten better over the past few weeks if she decided just to keep her scheduled visit. He does not have ulceration or infection. She does not have ischemic rest pain. Her noninvasive studies today demonstrate a drop in her right ABI now measuring 0.48. Her left ABI is stable at 0.65, but her left SFA stenosis has increased by duplex criteria. She has a right SFA occlusion with some worsening stenosis in the common femoral and profunda femoris artery on duplex.  Current Outpatient Prescriptions  Medication Sig Dispense Refill  . ALPRAZolam (XANAX) 0.5 MG tablet Take 1 tablet (0.5 mg total) by mouth daily as needed for sleep. 90 tablet 0  . Ascorbic Acid (VITAMIN C) 1000 MG tablet Take 2,000-3,000 mg by mouth daily.    . Bromelains 500 MG TABS Take 500 mg by mouth daily.    . carvedilol (COREG) 3.125 MG tablet Take 1 tablet (3.125 mg total) by mouth 2 (two) times daily with a meal. 90 tablet 1  . Cholecalciferol (D3 MAXIMUM STRENGTH) 5000 units capsule Take 10,000 Units by mouth daily.    . clopidogrel (PLAVIX) 75 MG tablet Take 1 tablet (75 mg total) by mouth daily. 90 tablet 3  . Coenzyme Q10 (CO Q 10) 100 MG CAPS Take 100 mg by mouth daily.    . Cromolyn Sodium (NASAL ALLERGY NA) Place into the nose daily. X Clear    . diphenhydrAMINE (BENADRYL) 25 MG tablet Take 1 tablet (25 mg total) by mouth every 6 (six) hours. 20 tablet 0  . doxycycline (VIBRA-TABS) 100 MG tablet Take 1 tablet (100 mg total) by mouth 2 (two) times daily. 20 tablet 0  . furosemide (LASIX) 40 MG tablet Take 40 mg by  mouth 2 (two) times daily.    . Garlic 6789 MG CAPS Take 1,000 mg by mouth daily.    . isosorbide mononitrate (IMDUR) 60 MG 24 hr tablet Take 60 mg by mouth 2 (two) times daily. 120mg -am, 60mg -pm    . KRILL OIL PO Take 2,000 mg by mouth daily.     Marland Kitchen losartan (COZAAR) 25 MG tablet Take 12.5 mg by mouth daily.    . Magnesium 250 MG TABS Take by mouth. Take 2 per day    . nitroGLYCERIN (NITROSTAT) 0.4 MG SL tablet Place 1 tablet (0.4 mg total) under the tongue every 5 (five) minutes as needed for chest pain. 50 tablet 3  . OIL OF OREGANO PO Take 1,920 mg by mouth daily.    . potassium chloride (K-DUR) 10 MEQ tablet Take 20 mEq by mouth daily.     Marland Kitchen QUERCETIN PO Take 500 mg by mouth daily.    . rosuvastatin (CRESTOR) 5 MG tablet Take 1 tablet (5 mg total) by mouth daily. 30 tablet 3  . spironolactone (ALDACTONE) 25 MG tablet Take 12.5 mg by mouth once.     . TURMERIC PO Take 448 mg by mouth daily.     . vitamin E (VITAMIN E) 400 UNIT capsule Take 400 Units by mouth daily.     No current facility-administered medications  for this visit.     Past Medical History:  Diagnosis Date  . CAD (coronary artery disease) 2003   s/p CABG x 5 (Dr Evelina Dun), [place]- 2010  . Endometriosis   . Hypercholesteremia   . Hypertension   . PVD (peripheral vascular disease) ([place])    s/p intervention (left) - 2004, right (2006)    Past Surgical History:  Procedure Laterality Date  . ABDOMINAL HYSTERECTOMY    . BREAST SURGERY  2004   cyst removed  . cataract surgery  2007   left  . COLONOSCOPY WITH PROPOFOL N/A 10/30/2015   Procedure: COLONOSCOPY WITH PROPOFOL;  Surgeon: Robert Bellow, MD;  Location: [facility] ENDOSCOPY;  Service: Endoscopy;  Laterality: N/A;  . CORONARY ARTERY BYPASS GRAFT     2003    Social History Social History  Substance Use Topics  . Smoking status: Former Smoker    Quit date: 09/03/2008  . Smokeless tobacco: Never Used  . Alcohol use 0.0 oz/week     Comment: once a year     Family  History Family History  Problem Relation Age of Onset  . Heart disease Mother   . CVA Mother   . Heart disease Father        CABG  . Heart disease Unknown        aunts and uncles  . Breast cancer Neg Hx   . Colon cancer Neg Hx      Allergies  Allergen Reactions  . Cyclobenzaprine Other (See Comments)    Felt bad  . Prednisone Other (See Comments)    Felt bad     REVIEW OF SYSTEMS (Negative unless checked)  Constitutional: [] Weight loss  [] Fever  [] Chills Cardiac: [] Chest pain   [] Chest pressure   [x] Palpitations   [] Shortness of breath when laying flat   [] Shortness of breath at rest   [x] Shortness of breath with exertion. Vascular:  [x] Pain in legs with walking   [] Pain in legs at rest   [] Pain in legs when laying flat   [x] Claudication   [] Pain in feet when walking  [] Pain in feet at rest  [] Pain in feet when laying flat   [] History of DVT   [] Phlebitis   [] Swelling in legs   [] Varicose veins   [] Non-healing ulcers Pulmonary:   [] Uses home oxygen   [] Productive cough   [] Hemoptysis   [] Wheeze  [] COPD   [] Asthma Neurologic:  [] Dizziness  [] Blackouts   [] Seizures   [] History of stroke   [] History of TIA  [] Aphasia   [] Temporary blindness   [] Dysphagia   [] Weakness or numbness in arms   [] Weakness or numbness in legs Musculoskeletal:  [x] Arthritis   [] Joint swelling   [] Joint pain   [] Low back pain Hematologic:  [] Easy bruising  [] Easy bleeding   [] Hypercoagulable state   [] Anemic   Gastrointestinal:  [] Blood in stool   [] Vomiting blood  [] Gastroesophageal reflux/heartburn   [] Abdominal pain Genitourinary:  [] Chronic kidney disease   [] Difficult urination  [] Frequent urination  [] Burning with urination   [] Hematuria Skin:  [] Rashes   [] Ulcers   [] Wounds Psychological:  [] History of anxiety   []  History of major depression.  Physical Examination  BP (!) 154/79 (BP Location: Right Arm)   Pulse 70   Resp 16   Ht 5\' 7"  (1.702 m)   Wt 159 lb (72.1 kg)   BMI 24.90 kg/m  Gen:   WD/WN, NAD Head: [place]/AT, No temporalis wasting. Ear/Nose/Throat: Hearing grossly intact, nares w/o erythema or drainage, trachea  midline Eyes: Conjunctiva clear. Sclera non-icteric Neck: Supple.  No JVD.  Pulmonary:  Good air movement, no use of accessory muscles.  Cardiac: RRR, normal S1, S2 Vascular:  Vessel Right Left  Radial Palpable Palpable  Ulnar Palpable Palpable  Brachial Palpable Palpable  Carotid Palpable, without bruit Palpable, without bruit  Aorta Not palpable N/A  Femoral 1+ Palpable Palpable  Popliteal Not Palpable Not Palpable  PT Not Palpable 1+ Palpable  DP 1+ Palpable 1+ Palpable   Gastrointestinal: soft, non-tender/non-distended.  Musculoskeletal: M/S 5/5 throughout.  No deformity or atrophy.  Neurologic: Sensation grossly intact in extremities.  Symmetrical.  Speech is fluent.  Psychiatric: Judgment intact, Mood & affect appropriate for pt's clinical situation. Dermatologic: No rashes or ulcers noted.  No cellulitis or open wounds.       Labs Recent Results (from the past 2160 hour(s))  TSH     Status: None   Collection Time: 05/12/16  8:01 AM  Result Value Ref Range   TSH 2.18 0.35 - 4.50 uIU/mL  CBC with Differential/Platelet     Status: None   Collection Time: 05/12/16  8:01 AM  Result Value Ref Range   WBC 4.9 4.0 - 10.5 K/uL   RBC 4.53 3.87 - 5.11 Mil/uL   Hemoglobin 14.1 12.0 - 15.0 g/dL   HCT 40.9 36.0 - 46.0 %   MCV 90.4 78.0 - 100.0 fl   MCHC 34.4 30.0 - 36.0 g/dL   RDW 14.2 11.5 - 15.5 %   Platelets 226.0 150.0 - 400.0 K/uL   Neutrophils Relative % 57.6 43.0 - 77.0 %   Lymphocytes Relative 31.3 12.0 - 46.0 %   Monocytes Relative 7.5 3.0 - 12.0 %   Eosinophils Relative 2.4 0.0 - 5.0 %   Basophils Relative 1.2 0.0 - 3.0 %   Neutro Abs 2.8 1.4 - 7.7 K/uL   Lymphs Abs 1.5 0.7 - 4.0 K/uL   Monocytes Absolute 0.4 0.1 - 1.0 K/uL   Eosinophils Absolute 0.1 0.0 - 0.7 K/uL   Basophils Absolute 0.1 0.0 - 0.1 K/uL  Hepatic function panel      Status: None   Collection Time: 05/12/16  8:01 AM  Result Value Ref Range   Total Bilirubin 0.4 0.2 - 1.2 mg/dL   Bilirubin, Direct 0.1 0.0 - 0.3 mg/dL   Alkaline Phosphatase 57 39 - 117 U/L   AST 19 0 - 37 U/L   ALT 16 0 - 35 U/L   Total Protein 7.2 6.0 - 8.3 g/dL   Albumin 4.8 3.5 - 5.2 g/dL  Lipid panel     Status: Abnormal   Collection Time: 05/12/16  8:01 AM  Result Value Ref Range   Cholesterol 277 (H) 0 - 200 mg/dL    Comment: ATP III Classification       Desirable:  < 200 mg/dL               Borderline High:  200 - 239 mg/dL          High:  > = 240 mg/dL   Triglycerides 271.0 (H) 0.0 - 149.0 mg/dL    Comment: Normal:  <150 mg/dLBorderline High:  150 - 199 mg/dL   HDL 44.10 >39.00 mg/dL   VLDL 54.2 (H) 0.0 - 40.0 mg/dL   Total CHOL/HDL Ratio 6     Comment:                Men          Women1/2 Average Risk  3.4          3.3Average Risk          5.0          4.42X Average Risk          9.6          7.13X Average Risk          15.0          11.0                       NonHDL 233.14     Comment: NOTE:  Non-HDL goal should be 30 mg/dL higher than patient's LDL goal (i.e. LDL goal of < 70 mg/dL, would have non-HDL goal of < 100 mg/dL)  Hemoglobin A1c     Status: None   Collection Time: 05/12/16  8:01 AM  Result Value Ref Range   Hgb A1c MFr Bld 5.9 4.6 - 6.5 %    Comment: Glycemic Control Guidelines for People with Diabetes:Non Diabetic:  <6%Goal of Therapy: <7%Additional Action Suggested:  >5%   Basic metabolic panel     Status: Abnormal   Collection Time: 05/12/16  8:01 AM  Result Value Ref Range   Sodium 138 135 - 145 mEq/L   Potassium 4.3 3.5 - 5.1 mEq/L   Chloride 100 96 - 112 mEq/L   CO2 31 19 - 32 mEq/L   Glucose, Bld 109 (H) 70 - 99 mg/dL   BUN 18 6 - 23 mg/dL   Creatinine, Ser 0.89 0.40 - 1.20 mg/dL   Calcium 10.0 8.4 - 10.5 mg/dL   GFR 67.51 >60.00 mL/min  LDL cholesterol, direct     Status: None   Collection Time: 05/12/16  8:01 AM  Result Value Ref Range    Direct LDL 178.0 mg/dL    Comment: Optimal:  <100 mg/dLNear or Above Optimal:  100-129 mg/dLBorderline High:  130-159 mg/dLHigh:  160-189 mg/dLVery High:  >190 mg/dL  VAS [place] LOWER EXTREMITY ARTERIAL DUPLEX     Status: None (In process)   Collection Time: 06/19/16  3:41 PM  Result Value Ref Range   Left super femoral prox sys PSV -611 cm/s   Left super femoral mid sys PSV -205 cm/s   Left super femoral dist sys PSV -52 cm/s   Left popliteal prox sys PSV 59 cm/s   Right peroneal sys min -9 m/s   Right super femoral prox sys PSV 369 cm/s   Right super femoral mid sys PSV -126 cm/s   Right super femoral dist sys PSV -86 cm/s   Right popliteal prox sys PSV 34 cm/s   Right peroneal sys PSV -37 cm/s   Left ant tibial distal sys 36 cm/s   left ant tibial distal dia 0 cm/s   left post tibial dist sys 26 cm/s   left post tibial dist dia 0 cm/s   LEFT PERO DIST DIA 0 cm/s   LEFT PERO DIST SYS -26.00 cm/s   RIGHT ANT DIST TIBAL SYS PSV 48 cm/s   RIGHT ANT DIST TIBAL DIA PSV 16 cm/s   RIGHT POST TIB DIST SYS 62 cm/s   RIGHT POST TIB DIST DIA 0 cm/s   LEFT SFA PROX DYS VEL 0 cm/s   LEFT SFA MID VEL 0 cm/s   LEFT SFA DIST DYS VEL 0 cm/s   LEFT POPLITEAL PROX DYS VEL 0 cm/s   RIGHT SUPER FEMORAL PROX EDV 0 cm/sec   RIGHT SUPER FEMORAL  MID EDV -28 cm/sec   RIGHT SUPER FEMORAL DIST EDV -24 cm/sec   RIGHT POPLITEAL PROX EDV 0 cm/sec    Radiology No results found.    Assessment/Plan  Hypertension blood pressure control important in reducing the progression of atherosclerotic disease. On appropriate oral medications.   Hypercholesteremia lipid control important in reducing the progression of atherosclerotic disease. Continue statin therapy   CAD (coronary artery disease) Stable, s/p CABG  Atherosclerosis of native arteries of extremity with intermittent claudication ([place]) Her noninvasive studies today demonstrate a drop in her right ABI now measuring 0.48. Her left ABI is stable at  0.65, but her left SFA stenosis has increased by duplex criteria. She has a right SFA occlusion with some worsening stenosis in the common femoral and profunda femoris artery on duplex. It sounds like her symptoms have worsened but are still not limited threatening. We again discussed angiography but she would prefer continued conservative management which I would agree with. I'll see her back in 6 months unless her symptoms worsened to a point where she cannot tolerate it any further.    Leotis Pain, MD  06/19/2016 5:04 PM    This note was created with Dragon medical transcription system.  Any errors from dictation are purely unintentional

## 2016-06-19 NOTE — Assessment & Plan Note (Signed)
blood pressure control important in reducing the progression of atherosclerotic disease. On appropriate oral medications.  

## 2016-06-19 NOTE — Assessment & Plan Note (Signed)
Her noninvasive studies today demonstrate a drop in her right ABI now measuring 0.48. Her left ABI is stable at 0.65, but her left SFA stenosis has increased by duplex criteria. She has a right SFA occlusion with some worsening stenosis in the common femoral and profunda femoris artery on duplex. It sounds like her symptoms have worsened but are still not limited threatening. We again discussed angiography but she would prefer continued conservative management which I would agree with. I'll see her back in 6 months unless her symptoms worsened to a point where she cannot tolerate it any further.

## 2016-06-25 ENCOUNTER — Other Ambulatory Visit: Payer: Medicare Other

## 2016-08-25 DIAGNOSIS — L03031 Cellulitis of right toe: Secondary | ICD-10-CM | POA: Diagnosis not present

## 2016-09-15 ENCOUNTER — Encounter: Payer: Medicare Other | Admitting: Internal Medicine

## 2016-10-14 DIAGNOSIS — H2511 Age-related nuclear cataract, right eye: Secondary | ICD-10-CM | POA: Diagnosis not present

## 2016-10-14 DIAGNOSIS — H401130 Primary open-angle glaucoma, bilateral, stage unspecified: Secondary | ICD-10-CM | POA: Diagnosis not present

## 2016-10-29 DIAGNOSIS — I429 Cardiomyopathy, unspecified: Secondary | ICD-10-CM | POA: Diagnosis not present

## 2016-10-29 DIAGNOSIS — I209 Angina pectoris, unspecified: Secondary | ICD-10-CM | POA: Diagnosis not present

## 2016-10-29 DIAGNOSIS — E785 Hyperlipidemia, unspecified: Secondary | ICD-10-CM | POA: Diagnosis not present

## 2016-11-24 ENCOUNTER — Encounter: Payer: Self-pay | Admitting: Internal Medicine

## 2016-11-24 ENCOUNTER — Ambulatory Visit (INDEPENDENT_AMBULATORY_CARE_PROVIDER_SITE_OTHER): Payer: Medicare Other | Admitting: Internal Medicine

## 2016-11-24 VITALS — BP 130/70 | HR 67 | Temp 98.7°F | Resp 14 | Ht 67.32 in | Wt 159.8 lb

## 2016-11-24 DIAGNOSIS — I739 Peripheral vascular disease, unspecified: Secondary | ICD-10-CM | POA: Diagnosis not present

## 2016-11-24 DIAGNOSIS — R0989 Other specified symptoms and signs involving the circulatory and respiratory systems: Secondary | ICD-10-CM | POA: Diagnosis not present

## 2016-11-24 DIAGNOSIS — Z0000 Encounter for general adult medical examination without abnormal findings: Secondary | ICD-10-CM

## 2016-11-24 DIAGNOSIS — I70213 Atherosclerosis of native arteries of extremities with intermittent claudication, bilateral legs: Secondary | ICD-10-CM

## 2016-11-24 DIAGNOSIS — I2510 Atherosclerotic heart disease of native coronary artery without angina pectoris: Secondary | ICD-10-CM

## 2016-11-24 DIAGNOSIS — I10 Essential (primary) hypertension: Secondary | ICD-10-CM

## 2016-11-24 DIAGNOSIS — E7800 Pure hypercholesterolemia, unspecified: Secondary | ICD-10-CM

## 2016-11-24 DIAGNOSIS — R739 Hyperglycemia, unspecified: Secondary | ICD-10-CM | POA: Diagnosis not present

## 2016-11-24 LAB — HEPATIC FUNCTION PANEL
ALBUMIN: 4.8 g/dL (ref 3.5–5.2)
ALT: 17 U/L (ref 0–35)
AST: 21 U/L (ref 0–37)
Alkaline Phosphatase: 46 U/L (ref 39–117)
Bilirubin, Direct: 0.1 mg/dL (ref 0.0–0.3)
Total Bilirubin: 0.5 mg/dL (ref 0.2–1.2)
Total Protein: 7.4 g/dL (ref 6.0–8.3)

## 2016-11-24 LAB — BASIC METABOLIC PANEL
BUN: 9 mg/dL (ref 6–23)
CO2: 32 mEq/L (ref 19–32)
Calcium: 10 mg/dL (ref 8.4–10.5)
Chloride: 101 mEq/L (ref 96–112)
Creatinine, Ser: 0.95 mg/dL (ref 0.40–1.20)
GFR: 62.51 mL/min (ref >60.00)
GLUCOSE: 106 mg/dL — AB (ref 70–99)
POTASSIUM: 3.9 meq/L (ref 3.5–5.1)
Sodium: 139 mEq/L (ref 135–145)

## 2016-11-24 LAB — HEMOGLOBIN A1C: HEMOGLOBIN A1C: 5.8 % (ref 4.6–6.5)

## 2016-11-24 LAB — LIPID PANEL
CHOLESTEROL: 248 mg/dL — AB (ref 0–200)
HDL: 45.6 mg/dL (ref >39.00)
NonHDL: 202.32
Total CHOL/HDL Ratio: 5
Triglycerides: 238 mg/dL — ABNORMAL HIGH (ref 0.0–149.0)
VLDL: 47.6 mg/dL — AB (ref 0.0–40.0)

## 2016-11-24 LAB — LDL CHOLESTEROL, DIRECT: Direct LDL: 158 mg/dL

## 2016-11-24 MED ORDER — ALPRAZOLAM 0.5 MG PO TABS: 0.5000 mg | ORAL_TABLET | Freq: Every day | ORAL | 0 refills | Status: DC | PRN

## 2016-11-24 NOTE — Assessment & Plan Note (Signed)
Blood pressure has been under good control.  Same medication regimen.  She will spot check her pressure. Check metabolic panel.

## 2016-11-24 NOTE — Progress Notes (Signed)
[name], female   DOB: 1950-12-30, 66 y.o.   MRN: [id number]   Subjective:    [name], female    DOB: August 09, 1950, 66 y.o.   MRN: [id number]  HPI  Patient with past history of CAD, hypercholesterolemia, PVD and hypertension.  She comes in today to follow up on these issues as well as for a complete physical exam.  She reports she feels good.  Is going to the gym. Doing stretches.  Feels heart - stable.  Saw cardiology two weeks ago.  No sob.  No acid reflux reported.  No abdominal pain.  Bowels stable.  Not taking cholesterol medication.  She stopped crestor.  Discussed with her today.  Discussed need for treatment of her cholesterol.  She does not want to take a statin.  We discussed other treatment options.  Discussed zetia, welchol and repatha.  She declines.  Wants to work on diet and exercise.  Legs are stable.  Saw Dr Lucky Cowboy 06/2016.  Stable.  Has f/u next month.  Plans to f/u on her carotids as well.     Past Medical History:  Diagnosis Date  . CAD (coronary artery disease) 2003   s/p CABG x 5 (Dr Evelina Dun), [place]- 2010  . Endometriosis   . Hypercholesteremia   . Hypertension   . PVD (peripheral vascular disease) ([place])    s/p intervention (left) - 2004, right (2006)   Past Surgical History:  Procedure Laterality Date  . ABDOMINAL HYSTERECTOMY    . BREAST SURGERY  2004   cyst removed  . cataract surgery  2007   left  . COLONOSCOPY WITH PROPOFOL N/A 10/30/2015   Procedure: COLONOSCOPY WITH PROPOFOL;  Surgeon: Robert Bellow, MD;  Location: [facility] ENDOSCOPY;  Service: Endoscopy;  Laterality: N/A;  . CORONARY ARTERY BYPASS GRAFT     2003   Family History  Problem Relation Age of Onset  . Heart disease Mother   . CVA Mother   . Heart disease Father        CABG  . Heart disease Unknown        aunts and uncles  . Breast cancer Neg Hx   . Colon cancer Neg Hx    Social History   Social History  . Marital status: Married    Spouse name: N/A  .  Number of children: N/A  . Years of education: N/A   Social History Main Topics  . Smoking status: Former Smoker    Quit date: 09/03/2008  . Smokeless tobacco: Never Used  . Alcohol use 0.0 oz/week     Comment: once a year  . Drug use: No  . Sexual activity: Not Asked   Other Topics Concern  . None   Social History Narrative  . None    Outpatient Encounter Prescriptions as of 11/24/2016  Medication Sig  . ALPRAZolam (XANAX) 0.5 MG tablet Take 1 tablet (0.5 mg total) by mouth daily as needed for sleep.  . Ascorbic Acid (VITAMIN C) 1000 MG tablet Take 2,000-3,000 mg by mouth daily.  . Bromelains 500 MG TABS Take 500 mg by mouth daily.  . carvedilol (COREG) 3.125 MG tablet Take 1 tablet (3.125 mg total) by mouth 2 (two) times daily with a meal.  . Cholecalciferol (D3 MAXIMUM STRENGTH) 5000 units capsule Take 10,000 Units by mouth daily.  . clopidogrel (PLAVIX) 75 MG tablet Take 1 tablet (75 mg total) by mouth daily.  . Coenzyme Q10 (CO Q 10) 100  MG CAPS Take 100 mg by mouth daily.  . Cromolyn Sodium (NASAL ALLERGY NA) Place into the nose daily. X Clear  . diphenhydrAMINE (BENADRYL) 25 MG tablet Take 1 tablet (25 mg total) by mouth every 6 (six) hours.  . furosemide (LASIX) 40 MG tablet Take 40 mg by mouth 2 (two) times daily.  . Garlic 0981 MG CAPS Take 1,000 mg by mouth daily.  . isosorbide mononitrate (IMDUR) 60 MG 24 hr tablet Take 60 mg by mouth 2 (two) times daily. 120mg -am, 60mg -pm  . KRILL OIL PO Take 2,000 mg by mouth daily.   Marland Kitchen losartan (COZAAR) 25 MG tablet Take 12.5 mg by mouth daily.  . Magnesium 250 MG TABS Take by mouth. Take 2 per day  . nitroGLYCERIN (NITROSTAT) 0.4 MG SL tablet Place 1 tablet (0.4 mg total) under the tongue every 5 (five) minutes as needed for chest pain.  . OIL OF OREGANO PO Take 1,920 mg by mouth daily.  . potassium chloride (K-DUR) 10 MEQ tablet Take 20 mEq by mouth daily.   Marland Kitchen QUERCETIN PO Take 500 mg by mouth daily.  . rosuvastatin (CRESTOR) 5  MG tablet Take 1 tablet (5 mg total) by mouth daily.  Marland Kitchen spironolactone (ALDACTONE) 25 MG tablet Take 12.5 mg by mouth once.   . TURMERIC PO Take 448 mg by mouth daily.   . vitamin E (VITAMIN E) 400 UNIT capsule Take 400 Units by mouth daily.  . [DISCONTINUED] ALPRAZolam (XANAX) 0.5 MG tablet Take 1 tablet (0.5 mg total) by mouth daily as needed for sleep.  . [DISCONTINUED] doxycycline (VIBRA-TABS) 100 MG tablet Take 1 tablet (100 mg total) by mouth 2 (two) times daily.   No facility-administered encounter medications on file as of 11/24/2016.     Review of Systems  Constitutional: Negative for appetite change and unexpected weight change.  HENT: Negative for congestion and sinus pressure.   Eyes: Negative for pain and visual disturbance.  Respiratory: Negative for cough, chest tightness and shortness of breath.   Cardiovascular: Negative for palpitations and leg swelling.  Gastrointestinal: Negative for abdominal pain, diarrhea, nausea and vomiting.  Genitourinary: Negative for difficulty urinating and dysuria.  Musculoskeletal: Negative for joint swelling and myalgias.  Skin: Negative for color change and rash.  Neurological: Negative for dizziness, light-headedness and headaches.  Hematological: Negative for adenopathy. Does not bruise/bleed easily.  Psychiatric/Behavioral: Negative for agitation and dysphoric mood.       Objective:    Physical Exam  Constitutional: She is oriented to person, place, and time. She appears well-developed and well-nourished. No distress.  HENT:  Nose: Nose normal.  Mouth/Throat: Oropharynx is clear and moist.  Eyes: Right eye exhibits no discharge. Left eye exhibits no discharge. No scleral icterus.  Neck: Neck supple. No thyromegaly present.  Cardiovascular: Normal rate and regular rhythm.   Pulmonary/Chest: Breath sounds normal. No accessory muscle usage. No tachypnea. No respiratory distress. She has no decreased breath sounds. She has no  wheezes. She has no rhonchi. Right breast exhibits no inverted nipple, no mass, no nipple discharge and no tenderness (no axillary adenopathy). Left breast exhibits no inverted nipple, no mass, no nipple discharge and no tenderness (no axilarry adenopathy).  Abdominal: Soft. Bowel sounds are normal. There is no tenderness.  Musculoskeletal: She exhibits no edema or tenderness.  Lymphadenopathy:    She has no cervical adenopathy.  Neurological: She is alert and oriented to person, place, and time.  Skin: Skin is warm. No rash noted. No erythema.  Psychiatric: She has a normal mood and affect. Her behavior is normal.    BP 130/70 (BP Location: Left Arm, Patient Position: Sitting, Cuff Size: Normal)   Pulse 67   Temp 98.7 F (37.1 C) (Oral)   Resp 14   Ht 5' 7.32" (1.71 m)   Wt 159 lb 12.8 oz (72.5 kg)   SpO2 98%   BMI 24.79 kg/m  Wt Readings from Last 3 Encounters:  11/24/16 159 lb 12.8 oz (72.5 kg)  06/19/16 159 lb (72.1 kg)  05/05/16 163 lb 9.6 oz (74.2 kg)     Lab Results  Component Value Date   WBC 4.9 05/12/2016   HGB 14.1 05/12/2016   HCT 40.9 05/12/2016   PLT 226.0 05/12/2016   GLUCOSE 106 (H) 11/24/2016   CHOL 248 (H) 11/24/2016   TRIG 238.0 (H) 11/24/2016   HDL 45.60 11/24/2016   LDLDIRECT 158.0 11/24/2016   LDLCALC 157 07/24/2015   ALT 17 11/24/2016   AST 21 11/24/2016   NA 139 11/24/2016   K 3.9 11/24/2016   CL 101 11/24/2016   CREATININE 0.95 11/24/2016   BUN 9 11/24/2016   CO2 32 11/24/2016   TSH 2.18 05/12/2016   HGBA1C 5.8 11/24/2016       Assessment & Plan:   Problem List Items Addressed This Visit    Atherosclerosis of native arteries of extremity with intermittent claudication ([place])    Followed by Dr Lucky Cowboy.  Last evaluated 06/2016.  She preferred to hold on angiography.  Has f/u planned next month.        CAD (coronary artery disease)    S/p CABG.  Sees cardiology.  Just evaluated two weeks ago.  Stable.  Discussed risk factor modification.         Carotid bruit    Followed by Dr Lucky Cowboy.        Health care maintenance    Physical today 11/24/16.  Declines mammogram.  Discussed again with her today.  Also discussed bone density.  She will notify me when agreeable.  Colonoscopy 10/30/15 as outlined in overview.  Recommend f/u colonoscopy in 5 years.        Hypercholesteremia    Discussed importance of treating cholesterol.  She stopped crestor.  She does not want to take statin medication.  Discussed other treatment options as outlined.  She declines.  Wants to work on diet and exercise.  Follow lipid panel.  Will notify me if she changes her mind.        Relevant Orders   Hepatic function panel (Completed)   Lipid panel (Completed)   Hypertension - Primary    Blood pressure has been under good control.  Same medication regimen.  She will spot check her pressure. Check metabolic panel.        Relevant Orders   Basic metabolic panel (Completed)   Peripheral vascular disease ([place])    Has f/u planned with Dr Lucky Cowboy next month.  Reviewed his previous note.  Stable.         Other Visit Diagnoses    Hyperglycemia       Relevant Orders   Hemoglobin A1c (Completed)       Einar Pheasant, MD

## 2016-11-24 NOTE — Assessment & Plan Note (Signed)
Has f/u planned with Dr Lucky Cowboy next month.  Reviewed his previous note.  Stable.

## 2016-11-24 NOTE — Assessment & Plan Note (Signed)
Physical today 11/24/16.  Declines mammogram.  Discussed again with her today.  Also discussed bone density.  She will notify me when agreeable.  Colonoscopy 10/30/15 as outlined in overview.  Recommend f/u colonoscopy in 5 years.

## 2016-11-27 ENCOUNTER — Encounter: Payer: Self-pay | Admitting: Internal Medicine

## 2016-11-27 NOTE — Assessment & Plan Note (Signed)
Followed by Dr Dew.   

## 2016-11-27 NOTE — Assessment & Plan Note (Signed)
Discussed importance of treating cholesterol.  She stopped crestor.  She does not want to take statin medication.  Discussed other treatment options as outlined.  She declines.  Wants to work on diet and exercise.  Follow lipid panel.  Will notify me if she changes her mind.

## 2016-11-27 NOTE — Assessment & Plan Note (Signed)
S/p CABG.  Sees cardiology.  Just evaluated two weeks ago.  Stable.  Discussed risk factor modification.

## 2016-11-27 NOTE — Assessment & Plan Note (Signed)
Followed by Dr Lucky Cowboy.  Last evaluated 06/2016.  She preferred to hold on angiography.  Has f/u planned next month.

## 2016-11-30 DIAGNOSIS — H40013 Open angle with borderline findings, low risk, bilateral: Secondary | ICD-10-CM | POA: Diagnosis not present

## 2016-11-30 DIAGNOSIS — Z961 Presence of intraocular lens: Secondary | ICD-10-CM | POA: Diagnosis not present

## 2016-11-30 DIAGNOSIS — H25011 Cortical age-related cataract, right eye: Secondary | ICD-10-CM | POA: Diagnosis not present

## 2016-11-30 DIAGNOSIS — H2511 Age-related nuclear cataract, right eye: Secondary | ICD-10-CM | POA: Diagnosis not present

## 2017-01-01 ENCOUNTER — Ambulatory Visit (INDEPENDENT_AMBULATORY_CARE_PROVIDER_SITE_OTHER): Payer: Medicare Other | Admitting: Vascular Surgery

## 2017-01-01 ENCOUNTER — Other Ambulatory Visit (INDEPENDENT_AMBULATORY_CARE_PROVIDER_SITE_OTHER): Payer: Self-pay | Admitting: Vascular Surgery

## 2017-01-01 ENCOUNTER — Ambulatory Visit (INDEPENDENT_AMBULATORY_CARE_PROVIDER_SITE_OTHER): Payer: Medicare Other

## 2017-01-01 ENCOUNTER — Other Ambulatory Visit (INDEPENDENT_AMBULATORY_CARE_PROVIDER_SITE_OTHER): Payer: Medicare Other

## 2017-01-01 ENCOUNTER — Encounter (INDEPENDENT_AMBULATORY_CARE_PROVIDER_SITE_OTHER): Payer: Self-pay | Admitting: Vascular Surgery

## 2017-01-01 ENCOUNTER — Encounter (INDEPENDENT_AMBULATORY_CARE_PROVIDER_SITE_OTHER): Payer: Self-pay

## 2017-01-01 VITALS — BP 135/73 | HR 77 | Resp 17 | Wt 159.8 lb

## 2017-01-01 DIAGNOSIS — I10 Essential (primary) hypertension: Secondary | ICD-10-CM

## 2017-01-01 DIAGNOSIS — I739 Peripheral vascular disease, unspecified: Secondary | ICD-10-CM

## 2017-01-01 DIAGNOSIS — I6523 Occlusion and stenosis of bilateral carotid arteries: Secondary | ICD-10-CM

## 2017-01-01 DIAGNOSIS — I70213 Atherosclerosis of native arteries of extremities with intermittent claudication, bilateral legs: Secondary | ICD-10-CM

## 2017-01-01 DIAGNOSIS — I6529 Occlusion and stenosis of unspecified carotid artery: Secondary | ICD-10-CM | POA: Insufficient documentation

## 2017-01-01 DIAGNOSIS — E7800 Pure hypercholesterolemia, unspecified: Secondary | ICD-10-CM

## 2017-01-01 NOTE — Progress Notes (Signed)
MRN : [id number]  Jennifer [name] is a 66 y.o. ([birth year]) female who presents with chief complaint of  Chief Complaint  Patient presents with  . Follow-up    76mo abi,carotid,ble art ultrasound  .  History of Present Illness: Patient returns today in follow up of multiple vascular issues.  She is doing well without specific complaints.  She is having more arthritic hip pain on the right and not really a lot of claudication.  She has previously undergone treatment for PAD in the past.  Her noninvasive studies today demonstrate stable to slightly improved ABIs of 0.52 on the right and 0.74 on the left.  Duplex suggests right common femoral and bifurcation disease which is in the 50-99% range with occlusion of the right SFA.  The left SFA has 75-95% stenosis. She is also studied for carotid disease today.  She denies any focal neurologic symptoms. Specifically, the patient denies amaurosis fugax, speech or swallowing difficulties, or arm or leg weakness or numbness. Her carotid duplex today reveals 60-79% carotid artery stenosis bilaterally.  This has shown progression from her previous study but that was 4 years ago.         Current Outpatient Prescriptions  Medication Sig Dispense Refill  . ALPRAZolam (XANAX) 0.5 MG tablet Take 1 tablet (0.5 mg total) by mouth daily as needed for sleep. 90 tablet 0  . Ascorbic Acid (VITAMIN C) 1000 MG tablet Take 2,000-3,000 mg by mouth daily.    . Bromelains 500 MG TABS Take 500 mg by mouth daily.    . carvedilol (COREG) 3.125 MG tablet Take 1 tablet (3.125 mg total) by mouth 2 (two) times daily with a meal. 90 tablet 1  . Cholecalciferol (D3 MAXIMUM STRENGTH) 5000 units capsule Take 10,000 Units by mouth daily.    . clopidogrel (PLAVIX) 75 MG tablet Take 1 tablet (75 mg total) by mouth daily. 90 tablet 3  . Coenzyme Q10 (CO Q 10) 100 MG CAPS Take 100 mg by mouth daily.    . Cromolyn Sodium (NASAL ALLERGY NA) Place into the nose daily. X Clear     . diphenhydrAMINE (BENADRYL) 25 MG tablet Take 1 tablet (25 mg total) by mouth every 6 (six) hours. 20 tablet 0  . doxycycline (VIBRA-TABS) 100 MG tablet Take 1 tablet (100 mg total) by mouth 2 (two) times daily. 20 tablet 0  . furosemide (LASIX) 40 MG tablet Take 40 mg by mouth 2 (two) times daily.    . Garlic 0569 MG CAPS Take 1,000 mg by mouth daily.    . isosorbide mononitrate (IMDUR) 60 MG 24 hr tablet Take 60 mg by mouth 2 (two) times daily. 120mg -am, 60mg -pm    . KRILL OIL PO Take 2,000 mg by mouth daily.     Marland Kitchen losartan (COZAAR) 25 MG tablet Take 12.5 mg by mouth daily.    . Magnesium 250 MG TABS Take by mouth. Take 2 per day    . nitroGLYCERIN (NITROSTAT) 0.4 MG SL tablet Place 1 tablet (0.4 mg total) under the tongue every 5 (five) minutes as needed for chest pain. 50 tablet 3  . OIL OF OREGANO PO Take 1,920 mg by mouth daily.    . potassium chloride (K-DUR) 10 MEQ tablet Take 20 mEq by mouth daily.     Marland Kitchen QUERCETIN PO Take 500 mg by mouth daily.    . rosuvastatin (CRESTOR) 5 MG tablet Take 1 tablet (5 mg total) by mouth daily. 30 tablet 3  .  spironolactone (ALDACTONE) 25 MG tablet Take 12.5 mg by mouth once.     . TURMERIC PO Take 448 mg by mouth daily.     . vitamin E (VITAMIN E) 400 UNIT capsule Take 400 Units by mouth daily.     No current facility-administered medications for this visit.         Past Medical History:  Diagnosis Date  . CAD (coronary artery disease) 2003   s/p CABG x 5 (Dr Evelina Dun), [place]- 2010  . Endometriosis   . Hypercholesteremia   . Hypertension   . PVD (peripheral vascular disease) ([place])    s/p intervention (left) - 2004, right (2006)         Past Surgical History:  Procedure Laterality Date  . ABDOMINAL HYSTERECTOMY    . BREAST SURGERY  2004   cyst removed  . cataract surgery  2007   left  . COLONOSCOPY WITH PROPOFOL N/A 10/30/2015   Procedure: COLONOSCOPY WITH PROPOFOL;  Surgeon: Robert Bellow,  MD;  Location: [facility] ENDOSCOPY;  Service: Endoscopy;  Laterality: N/A;  . CORONARY ARTERY BYPASS GRAFT     2003    Social History        Social History  Substance Use Topics  . Smoking status: Former Smoker    Quit date: 09/03/2008  . Smokeless tobacco: Never Used  . Alcohol use 0.0 oz/week      Comment: once a year     Family History      Family History  Problem Relation Age of Onset  . Heart disease Mother   . CVA Mother   . Heart disease Father        CABG  . Heart disease Unknown        aunts and uncles  . Breast cancer Neg Hx   . Colon cancer Neg Hx           Allergies  Allergen Reactions  . Cyclobenzaprine Other (See Comments)    Felt bad  . Prednisone Other (See Comments)    Felt bad     REVIEW OF SYSTEMS (Negative unless checked)  Constitutional: [] Weight loss  [] Fever  [] Chills Cardiac: [] Chest pain   [] Chest pressure   [x] Palpitations   [] Shortness of breath when laying flat   [] Shortness of breath at rest   [x] Shortness of breath with exertion. Vascular:  [x] Pain in legs with walking   [] Pain in legs at rest   [] Pain in legs when laying flat   [x] Claudication   [] Pain in feet when walking  [] Pain in feet at rest  [] Pain in feet when laying flat   [] History of DVT   [] Phlebitis   [] Swelling in legs   [] Varicose veins   [] Non-healing ulcers Pulmonary:   [] Uses home oxygen   [] Productive cough   [] Hemoptysis   [] Wheeze  [] COPD   [] Asthma Neurologic:  [] Dizziness  [] Blackouts   [] Seizures   [] History of stroke   [] History of TIA  [] Aphasia   [] Temporary blindness   [] Dysphagia   [] Weakness or numbness in arms   [] Weakness or numbness in legs Musculoskeletal:  [x] Arthritis   [] Joint swelling   [] Joint pain   [] Low back pain Hematologic:  [] Easy bruising  [] Easy bleeding   [] Hypercoagulable state   [] Anemic   Gastrointestinal:  [] Blood in stool   [] Vomiting blood  [] Gastroesophageal reflux/heartburn   [] Abdominal  pain Genitourinary:  [] Chronic kidney disease   [] Difficult urination  [] Frequent urination  [] Burning with urination   [] Hematuria Skin:  []   Rashes   [] Ulcers   [] Wounds Psychological:  [] History of anxiety   []  History of major depression.      Physical Examination  BP 135/73 (BP Location: Right Arm)   Pulse 77   Resp 17   Wt 72.5 kg (159 lb 12.8 oz)   BMI 24.79 kg/m  Gen:  WD/WN, NAD Head: Jennifer [name], No temporalis wasting. Ear/Nose/Throat: Hearing grossly intact, nares w/o erythema or drainage, trachea midline Eyes: Conjunctiva clear. Sclera non-icteric Neck: Supple.  No JVD.  Pulmonary:  Good air movement, no use of accessory muscles.  Cardiac: RRR, normal S1, S2 Vascular:  Vessel Right Left  Radial Palpable Palpable                      Popliteal Not Palpable Not Palpable  PT Not Palpable 1+ Palpable  DP Trace Palpable 1+ Palpable    Musculoskeletal: M/S 5/5 throughout.  No deformity or atrophy. No edema. Neurologic: Sensation grossly intact in extremities.  Symmetrical.  Speech is fluent.  Psychiatric: Judgment intact, Mood & affect appropriate for pt's clinical situation. Dermatologic: No rashes or ulcers noted.  No cellulitis or open wounds.       Labs Recent Results (from the past 2160 hour(s))  Hemoglobin A1c     Status: None   Collection Time: 11/24/16  9:14 AM  Result Value Ref Range   Hgb A1c MFr Bld 5.8 4.6 - 6.5 %    Comment: Glycemic Control Guidelines for People with Diabetes:Non Diabetic:  <6%Goal of Therapy: <7%Additional Action Suggested:  >8%   Hepatic function panel     Status: None   Collection Time: 11/24/16  9:14 AM  Result Value Ref Range   Total Bilirubin 0.5 0.2 - 1.2 mg/dL   Bilirubin, Direct 0.1 0.0 - 0.3 mg/dL   Alkaline Phosphatase 46 39 - 117 U/L   AST 21 0 - 37 U/L   ALT 17 0 - 35 U/L   Total Protein 7.4 6.0 - 8.3 g/dL   Albumin 4.8 3.5 - 5.2 g/dL  Lipid panel     Status: Abnormal   Collection Time: 11/24/16  9:14 AM   Result Value Ref Range   Cholesterol 248 (H) 0 - 200 mg/dL    Comment: ATP III Classification       Desirable:  < 200 mg/dL               Borderline High:  200 - 239 mg/dL          High:  > = 240 mg/dL   Triglycerides 238.0 (H) 0.0 - 149.0 mg/dL    Comment: Normal:  <150 mg/dLBorderline High:  150 - 199 mg/dL   HDL 45.60 >39.00 mg/dL   VLDL 47.6 (H) 0.0 - 40.0 mg/dL   Total CHOL/HDL Ratio 5     Comment:                Men          Women1/2 Average Risk     3.4          3.3Average Risk          5.0          4.42X Average Risk          9.6          7.13X Average Risk          15.0          11.0  NonHDL 202.32     Comment: NOTE:  Non-HDL goal should be 30 mg/dL higher than patient's LDL goal (i.e. LDL goal of < 70 mg/dL, would have non-HDL goal of < 100 mg/dL)  Basic metabolic panel     Status: Abnormal   Collection Time: 11/24/16  9:14 AM  Result Value Ref Range   Sodium 139 135 - 145 mEq/L   Potassium 3.9 3.5 - 5.1 mEq/L   Chloride 101 96 - 112 mEq/L   CO2 32 19 - 32 mEq/L   Glucose, Bld 106 (H) 70 - 99 mg/dL   BUN 9 6 - 23 mg/dL   Creatinine, Ser 0.95 0.40 - 1.20 mg/dL   Calcium 10.0 8.4 - 10.5 mg/dL   GFR 62.51 >60.00 mL/min  LDL cholesterol, direct     Status: None   Collection Time: 11/24/16  9:14 AM  Result Value Ref Range   Direct LDL 158.0 mg/dL    Comment: Optimal:  <100 mg/dLNear or Above Optimal:  100-129 mg/dLBorderline High:  130-159 mg/dLHigh:  160-189 mg/dLVery High:  >190 mg/dL  VAS US CAROTID     Status: None (In process)   Collection Time: 01/01/17 12:00 PM  Result Value Ref Range   Right CCA prox sys 78 cm/s   Right CCA prox dias 24 cm/s   Right cca dist sys -114 cm/s   Left CCA prox sys 126 cm/s   Left CCA prox dias 31 cm/s   Left CCA dist sys -148 cm/s   Left CCA dist dias -40 cm/s   Left ICA prox sys 218 cm/s   Left ICA prox dias 79 cm/s   Left ICA dist sys -71 cm/s   Left ICA dist dias -16 cm/s   RIGHT CCA MID DIAS 17.00 cm/s    RIGHT ECA DIAS 0.00 cm/s   RIGHT VERTEBRAL DIAS 26.00 cm/s   LEFT ECA DIAS -30.00 cm/s   LEFT VERTEBRAL DIAS 21.00 cm/s    Radiology No results found.   Assessment/Plan Hypertension blood pressure control important in reducing the progression of atherosclerotic disease. On appropriate oral medications.   Hypercholesteremia lipid control important in reducing the progression of atherosclerotic disease. Continue statin therapy   CAD (coronary artery disease) Stable, s/p CABG   Peripheral vascular disease Her noninvasive studies today demonstrate stable to slightly improved ABIs of 0.52 on the right and 0.74 on the left.  Duplex suggests right common femoral and bifurcation disease which is in the 50-99% range with occlusion of the right SFA.  The left SFA has 75-95% stenosis. Her perfusion is basically stable and her symptoms are mild at this point.  No intervention will be planned.  Continue Crestor and Plavix.  Plan to recheck perfusion in 6 months.  Carotid stenosis Her carotid duplex today reveals 60-79% carotid artery stenosis bilaterally.  This has shown progression from her previous study but that was 4 years ago.  She should continue Plavix and Crestor.  We will need to follow this more closely and I will plan to recheck this in 6 months.    Leotis Pain, MD  01/01/2017 2:15 PM    This note was created with Dragon medical transcription system.  Any errors from dictation are purely unintentional

## 2017-01-01 NOTE — Patient Instructions (Signed)
Carotid Artery Disease The carotid arteries are arteries on both sides of the neck. They carry blood to the brain. Carotid artery disease is when the arteries get smaller (narrow) or get blocked. If these arteries get smaller or get blocked, you are more likely to have a stroke or warning stroke (transient ischemic attack). Follow these instructions at home:  Take medicines as told by your doctor. Make sure you understand all your medicine instructions. Do not stop your medicines without talking to your doctor first.  Follow your doctor's diet instructions. It is important to eat a healthy diet that includes plenty of: ? Fresh fruits. ? Vegetables. ? Lean meats.  Avoid: ? High-fat foods. ? High-sodium foods. ? Foods that are fried, overly processed, or have poor nutritional value.  Stay a healthy weight.  Stay active. Get at least 30 minutes of activity every day.  Do not smoke.  Limit alcohol use to: ? No more than 2 drinks a day for men. ? No more than 1 drink a day for women who are not pregnant.  Do not use illegal drugs.  Keep all doctor visits as told. Get help right away if:  You have sudden weakness or loss of feeling (numbness) on one side of the body, such as the face, arm, or leg.  You have sudden confusion.  You have trouble speaking (aphasia) or understanding.  You have sudden trouble seeing out of one or both eyes.  You have sudden trouble walking.  You have dizziness or feel like you might pass out (faint).  You have a loss of balance or your movements are not steady (uncoordinated).  You have a sudden, severe headache with no known cause.  You have trouble swallowing (dysphagia). Call your local emergency services (911 in [place] Do notdrive yourself to the clinic or hospital. This information is not intended to replace advice given to you by your health care provider. Make sure you discuss any questions you have with your health care  provider. Document Released: 01/06/2012 Document Revised: 06/27/2015 Document Reviewed: 07/20/2012 Elsevier Interactive Patient Education  2018 [employer]  

## 2017-01-01 NOTE — Assessment & Plan Note (Signed)
Her carotid duplex today reveals 60-79% carotid artery stenosis bilaterally.  This has shown progression from her previous study but that was 4 years ago.  She should continue Plavix and Crestor.  We will need to follow this more closely and I will plan to recheck this in 6 months.

## 2017-01-01 NOTE — Assessment & Plan Note (Signed)
Her noninvasive studies today demonstrate stable to slightly improved ABIs of 0.52 on the right and 0.74 on the left.  Duplex suggests right common femoral and bifurcation disease which is in the 50-99% range with occlusion of the right SFA.  The left SFA has 75-95% stenosis. Her perfusion is basically stable and her symptoms are mild at this point.  No intervention will be planned.  Continue Crestor and Plavix.  Plan to recheck perfusion in 6 months.

## 2017-01-05 DIAGNOSIS — H2511 Age-related nuclear cataract, right eye: Secondary | ICD-10-CM | POA: Diagnosis not present

## 2017-01-05 DIAGNOSIS — H25811 Combined forms of age-related cataract, right eye: Secondary | ICD-10-CM | POA: Diagnosis not present

## 2017-01-15 DIAGNOSIS — H2511 Age-related nuclear cataract, right eye: Secondary | ICD-10-CM | POA: Diagnosis not present

## 2017-02-12 ENCOUNTER — Ambulatory Visit: Payer: Self-pay | Admitting: *Deleted

## 2017-02-12 ENCOUNTER — Telehealth: Payer: Self-pay | Admitting: Internal Medicine

## 2017-02-12 ENCOUNTER — Other Ambulatory Visit: Payer: Self-pay | Admitting: Internal Medicine

## 2017-02-12 MED ORDER — DOXYCYCLINE HYCLATE 100 MG PO TABS: 100.0000 mg | ORAL_TABLET | Freq: Two times a day (BID) | ORAL | 0 refills | Status: DC

## 2017-02-12 NOTE — Telephone Encounter (Signed)
Please advise 

## 2017-02-12 NOTE — Telephone Encounter (Signed)
Patient states that she has not had any surgeries in the last few years and did not want to go to the urgent care or ED. Patient stated she did not need to be out in the cold weather today and would really appreciate if you would call something in and if she needs evaluation she would be seen Monday if there are any openings.

## 2017-02-12 NOTE — Telephone Encounter (Signed)
Spoke to pt.  She is unable to get out now.  She has a history of sinus infections.  This feels like her typical sinus infection.  She is using otc meds.  Is progressing.  Will prescribe doxycycline.  Take probiotic as directed.  Follow.  Instructed to be evaluated if symptoms change or worsen.

## 2017-02-12 NOTE — Progress Notes (Signed)
rx ok'd for doxycycline.  Sent in to [facility]

## 2017-02-12 NOTE — Telephone Encounter (Signed)
Pt states that she she initially had a sore throat hat started on Tuesday 02/09/17 which progressed sinus pain and pressure, and teeth pain on 02/11/17; she states that she has done nasal washes and used aleive and benadryl with no relief in symptoms; she has also used her humidifier; nurse triage initiated and recommendation made for pt see physician within 3 days; offered pt opportunity to be evaluated in another [facility] office but she states that she recently had cataract surgery and doesn't want to drive far; the pt also would like to know if Dr Nicki Reaper would consider calling something in for her instead; spoke with Larena Glassman at [facility] regarding this issue and she requested that this encounter be routed to the office and she will forward this request to Dr Nicki Reaper and she will also call the pt back with further direction; pt can be reached at [phone]; pt verbalizes understanding.    Reason for Disposition . [1] Sinus congestion (pressure, fullness) AND [2] present > 10 days  Answer Assessment - Initial Assessment Questions 1. LOCATION: "Where does it hurt?"      Whole face including ears and teth 2. ONSET: "When did the sinus pain start?"  (e.g., hours, days)      02/11/17 3. SEVERITY: "How bad is the pain?"   (Scale 1-10; mild, moderate or severe)   - MILD (1-3): doesn't interfere with normal activities    - MODERATE (4-7): interferes with normal activities (e.g., work or school) or awakens from sleep   - SEVERE (8-10): excruciating pain and patient unable to do any normal activities        moderate 4. RECURRENT SYMPTOM: "Have you ever had sinus problems before?" If so, ask: "When was the last time?" and "What happened that time?"      Yes; more than 2 years when put in the hospital  5. NASAL CONGESTION: "Is the nose blocked?" If so, ask, "Can you open it or must you breathe through the mouth?"     yes 6. NASAL DISCHARGE: "Do you have discharge from your nose?" If so ask, "What color?"     Lime  green 7. FEVER: "Do you have a fever?" If so, ask: "What is it, how was it measured, and when did it start?"      no 8. OTHER SYMPTOMS: "Do you have any other symptoms?" (e.g., sore throat, cough, earache, difficulty breathing)     Pressure in around nose and eyes, chills, sore throat; productive cough 9. PREGNANCY: "Is there any chance you are pregnant?" "When was your last menstrual period?"     no  Protocols used: SINUS PAIN OR CONGESTION-A-AH

## 2017-02-12 NOTE — Telephone Encounter (Signed)
Copied from [place] [id number]. Topic: Quick Communication - See Telephone Encounter >> Feb 12, 2017  8:55 AM Burnis Medin, NT wrote: CRM for notification. See Telephone encounter for: Pt called and said she thinks she has a sinus infection and wanted to see if the doctor could call her something in for a sinus infection. Pt uses [facility] in [place]   02/12/17.

## 2017-02-12 NOTE — Telephone Encounter (Signed)
Given recent surgery and symptoms, I do feel she needs to be evaluated to confirm what is needed for treatment.

## 2017-05-10 DIAGNOSIS — I209 Angina pectoris, unspecified: Secondary | ICD-10-CM | POA: Diagnosis not present

## 2017-05-10 DIAGNOSIS — E785 Hyperlipidemia, unspecified: Secondary | ICD-10-CM | POA: Diagnosis not present

## 2017-05-10 DIAGNOSIS — I10 Essential (primary) hypertension: Secondary | ICD-10-CM | POA: Diagnosis not present

## 2017-05-10 DIAGNOSIS — I429 Cardiomyopathy, unspecified: Secondary | ICD-10-CM | POA: Diagnosis not present

## 2017-05-17 ENCOUNTER — Encounter: Payer: Self-pay | Admitting: *Deleted

## 2017-05-17 ENCOUNTER — Inpatient Hospital Stay
Admission: EM | Admit: 2017-05-17 | Discharge: 2017-05-19 | DRG: 292 | Disposition: A | Discharge status: Home / Self Care | Payer: Medicare Other | Attending: Family Medicine | Admitting: Family Medicine

## 2017-05-17 ENCOUNTER — Emergency Department: Payer: Medicare Other

## 2017-05-17 DIAGNOSIS — I447 Left bundle-branch block, unspecified: Secondary | ICD-10-CM | POA: Diagnosis present

## 2017-05-17 DIAGNOSIS — R748 Abnormal levels of other serum enzymes: Secondary | ICD-10-CM | POA: Diagnosis not present

## 2017-05-17 DIAGNOSIS — Z9111 Patient's noncompliance with dietary regimen: Secondary | ICD-10-CM | POA: Diagnosis not present

## 2017-05-17 DIAGNOSIS — I255 Ischemic cardiomyopathy: Secondary | ICD-10-CM | POA: Diagnosis present

## 2017-05-17 DIAGNOSIS — Z823 Family history of stroke: Secondary | ICD-10-CM | POA: Diagnosis not present

## 2017-05-17 DIAGNOSIS — R778 Other specified abnormalities of plasma proteins: Secondary | ICD-10-CM

## 2017-05-17 DIAGNOSIS — E785 Hyperlipidemia, unspecified: Secondary | ICD-10-CM | POA: Diagnosis present

## 2017-05-17 DIAGNOSIS — I248 Other forms of acute ischemic heart disease: Secondary | ICD-10-CM | POA: Diagnosis present

## 2017-05-17 DIAGNOSIS — I5033 Acute on chronic diastolic (congestive) heart failure: Secondary | ICD-10-CM | POA: Diagnosis present

## 2017-05-17 DIAGNOSIS — E876 Hypokalemia: Secondary | ICD-10-CM | POA: Diagnosis present

## 2017-05-17 DIAGNOSIS — R0902 Hypoxemia: Secondary | ICD-10-CM | POA: Diagnosis present

## 2017-05-17 DIAGNOSIS — Z7902 Long term (current) use of antithrombotics/antiplatelets: Secondary | ICD-10-CM | POA: Diagnosis not present

## 2017-05-17 DIAGNOSIS — Z8249 Family history of ischemic heart disease and other diseases of the circulatory system: Secondary | ICD-10-CM | POA: Diagnosis not present

## 2017-05-17 DIAGNOSIS — I739 Peripheral vascular disease, unspecified: Secondary | ICD-10-CM | POA: Diagnosis present

## 2017-05-17 DIAGNOSIS — R7989 Other specified abnormal findings of blood chemistry: Secondary | ICD-10-CM

## 2017-05-17 DIAGNOSIS — I2510 Atherosclerotic heart disease of native coronary artery without angina pectoris: Secondary | ICD-10-CM | POA: Diagnosis present

## 2017-05-17 DIAGNOSIS — Z87891 Personal history of nicotine dependence: Secondary | ICD-10-CM | POA: Diagnosis not present

## 2017-05-17 DIAGNOSIS — R0602 Shortness of breath: Secondary | ICD-10-CM | POA: Diagnosis not present

## 2017-05-17 DIAGNOSIS — I252 Old myocardial infarction: Secondary | ICD-10-CM

## 2017-05-17 DIAGNOSIS — R9431 Abnormal electrocardiogram [ECG] [EKG]: Secondary | ICD-10-CM | POA: Diagnosis not present

## 2017-05-17 DIAGNOSIS — I110 Hypertensive heart disease with heart failure: Secondary | ICD-10-CM | POA: Diagnosis not present

## 2017-05-17 DIAGNOSIS — I509 Heart failure, unspecified: Secondary | ICD-10-CM

## 2017-05-17 DIAGNOSIS — Z951 Presence of aortocoronary bypass graft: Secondary | ICD-10-CM | POA: Diagnosis not present

## 2017-05-17 DIAGNOSIS — J811 Chronic pulmonary edema: Secondary | ICD-10-CM | POA: Diagnosis not present

## 2017-05-17 LAB — CBC
HEMATOCRIT: 43.6 % (ref 35.0–47.0)
HEMOGLOBIN: 14.7 g/dL (ref 12.0–16.0)
MCH: 30.8 pg (ref 26.0–34.0)
MCHC: 33.8 g/dL (ref 32.0–36.0)
MCV: 91.3 fL (ref 80.0–100.0)
Platelets: 266 10*3/uL (ref 150–440)
RBC: 4.77 MIL/uL (ref 3.80–5.20)
RDW: 14.7 % — ABNORMAL HIGH (ref 11.5–14.5)
WBC: 7.6 10*3/uL (ref 3.6–11.0)

## 2017-05-17 LAB — COMPREHENSIVE METABOLIC PANEL
ALK PHOS: 61 U/L (ref 38–126)
ALT: 21 U/L (ref 14–54)
ANION GAP: 11 (ref 5–15)
AST: 28 U/L (ref 15–41)
Albumin: 4.4 g/dL (ref 3.5–5.0)
BILIRUBIN TOTAL: 0.9 mg/dL (ref 0.3–1.2)
BUN: 13 mg/dL (ref 6–20)
CALCIUM: 9.4 mg/dL (ref 8.9–10.3)
CO2: 24 mmol/L (ref 22–32)
CREATININE: 0.91 mg/dL (ref 0.44–1.00)
Chloride: 101 mmol/L (ref 101–111)
GFR calc non Af Amer: >60 mL/min (ref >60)
GLUCOSE: 173 mg/dL — AB (ref 65–99)
Potassium: 3 mmol/L — ABNORMAL LOW (ref 3.5–5.1)
SODIUM: 136 mmol/L (ref 135–145)
TOTAL PROTEIN: 7.5 g/dL (ref 6.5–8.1)

## 2017-05-17 LAB — BLOOD GAS, VENOUS
ACID-BASE DEFICIT: 0.5 mmol/L (ref 0.0–2.0)
BICARBONATE: 24.8 mmol/L (ref 20.0–28.0)
O2 Saturation: 69.3 %
PATIENT TEMPERATURE: 37
PH VEN: 7.38 (ref 7.250–7.430)
pCO2, Ven: 42 mmHg — ABNORMAL LOW (ref 44.0–60.0)
pO2, Ven: 37 mmHg (ref 32.0–45.0)

## 2017-05-17 LAB — TROPONIN I: Troponin I: 0.05 ng/mL (ref <0.03)

## 2017-05-17 LAB — BRAIN NATRIURETIC PEPTIDE: B Natriuretic Peptide: 586 pg/mL — ABNORMAL HIGH (ref 0.0–100.0)

## 2017-05-17 MED ORDER — NITROGLYCERIN 2 % TD OINT
1.0000 [in_us] | TOPICAL_OINTMENT | Freq: Once | TRANSDERMAL | Status: AC
  Administered 2017-05-17: 1 [in_us] via TOPICAL

## 2017-05-17 MED ORDER — ASPIRIN 81 MG PO CHEW
324.0000 mg | CHEWABLE_TABLET | Freq: Once | ORAL | Status: AC
  Administered 2017-05-17: 324 mg via ORAL
  Filled 2017-05-17: qty 4

## 2017-05-17 MED ORDER — FUROSEMIDE 10 MG/ML IJ SOLN
60.0000 mg | Freq: Once | INTRAMUSCULAR | Status: AC
  Administered 2017-05-17: 60 mg via INTRAVENOUS
  Filled 2017-05-17: qty 8

## 2017-05-17 MED ORDER — POTASSIUM CHLORIDE CRYS ER 20 MEQ PO TBCR
40.0000 meq | EXTENDED_RELEASE_TABLET | Freq: Once | ORAL | Status: AC
  Administered 2017-05-17: 40 meq via ORAL
  Filled 2017-05-17: qty 2

## 2017-05-17 MED ORDER — FUROSEMIDE 10 MG/ML IJ SOLN
40.0000 mg | Freq: Two times a day (BID) | INTRAMUSCULAR | Status: DC
  Administered 2017-05-18 – 2017-05-19 (×3): 40 mg via INTRAVENOUS
  Filled 2017-05-17 (×3): qty 4

## 2017-05-17 NOTE — H&P (Signed)
[place] [facility] [place] NAME: Jennifer [name]    MR#:  [id number]  DATE OF BIRTH:  [birth year]  DATE OF ADMISSION:  05/17/2017  PRIMARY CARE PHYSICIAN: Einar Pheasant, MD   REQUESTING/REFERRING PHYSICIAN:   CHIEF COMPLAINT:   Chief Complaint  Patient presents with  . Shortness of Breath    HISTORY OF PRESENT ILLNESS: Jennifer [name]  is a 66 y.o. female with a known history of coronary artery disease, CABG, hyperlipidemia, hypertension, peripheral vascular disease presented to the emergency room with increased shortness of breath.  Patient was doing well until today afternoon when she went for a massage.  While going back home she felt short of breath and had some sudden  cough.  When EMS picked up the patient her O2 sats were in the high 90s and she had some crackles in the lower basis.  She was initially put on a facemask and later on weaned down to oxygen via nasal cannula.  Patient received 60 mg IV Lasix in the emergency room chest x-ray showed vascular congestion and pulmonary edema.  She was in heart failure and she was given Lasix for diuresis.  No complaints of any chest pain.  Patient felt better after being given Lasix for diuresis.  PAST MEDICAL HISTORY:   Past Medical History:  Diagnosis Date  . CAD (coronary artery disease) 2003   s/p CABG x 5 (Dr Evelina Dun), [place]- 2010  . Endometriosis   . Hypercholesteremia   . Hypertension   . PVD (peripheral vascular disease) ([place])    s/p intervention (left) - 2004, right (2006)    PAST SURGICAL HISTORY:  Past Surgical History:  Procedure Laterality Date  . ABDOMINAL HYSTERECTOMY    . BREAST SURGERY  2004   cyst removed  . cataract surgery  2007   left  . COLONOSCOPY WITH PROPOFOL N/A 10/30/2015   Procedure: COLONOSCOPY WITH PROPOFOL;  Surgeon: Robert Bellow, MD;  Location[facility] ENDOSCOPY;  Service: Endoscopy;  Laterality: N/A;  . CORONARY ARTERY BYPASS GRAFT     2003    SOCIAL  HISTORY:  Social History   Tobacco Use  . Smoking status: Former Smoker    Last attempt to quit: 09/03/2008    Years since quitting: 8.7  . Smokeless tobacco: Never Used  Substance Use Topics  . Alcohol use: Yes    Alcohol/week: 0.0 oz    Comment: once a year    FAMILY HISTORY:  Family History  Problem Relation Age of Onset  . Heart disease Mother   . CVA Mother   . Heart disease Father        CABG  . Heart disease Unknown        aunts and uncles  . Breast cancer Neg Hx   . Colon cancer Neg Hx     DRUG ALLERGIES:  Allergies  Allergen Reactions  . Cyclobenzaprine Other (See Comments)    Felt bad  . Prednisone Other (See Comments)    Felt bad    REVIEW OF SYSTEMS:   CONSTITUTIONAL: No fever, fatigue or weakness.  EYES: No blurred or double vision.  EARS, NOSE, AND THROAT: No tinnitus or ear pain.  RESPIRATORY: Had cough, shortness of breath,  No wheezing or hemoptysis.  CARDIOVASCULAR: No chest pain, has orthopnea,  No edema.  GASTROINTESTINAL: No nausea, vomiting, diarrhea or abdominal pain.  GENITOURINARY: No dysuria, hematuria.  ENDOCRINE: No polyuria, nocturia,  HEMATOLOGY: No anemia, easy bruising or bleeding SKIN:  No rash or lesion. MUSCULOSKELETAL: No joint pain or arthritis.   NEUROLOGIC: No tingling, numbness, weakness.  PSYCHIATRY: No anxiety or depression.   MEDICATIONS AT HOME:  Prior to Admission medications   Medication Sig Start Date End Date Taking? Authorizing Provider  ALPRAZolam Duanne Moron) 0.5 MG tablet Take 1 tablet (0.5 mg total) by mouth daily as needed for sleep. Patient taking differently: Take 0.25 mg by mouth at bedtime.  11/24/16  Yes Einar Pheasant, MD  Ascorbic Acid (VITAMIN C) 1000 MG tablet Take 1,000 mg by mouth 2 (two) times daily.    Yes [provider]  carvedilol (COREG) 3.125 MG tablet Take 1 tablet (3.125 mg total) by mouth 2 (two) times daily with a meal. 12/01/11  Yes Einar Pheasant, MD  Cholecalciferol ([facility] D3)  2000 units CAPS Take 2,000 Units by mouth 2 (two) times daily.   Yes [provider]  clopidogrel (PLAVIX) 75 MG tablet Take 1 tablet (75 mg total) by mouth daily. 11/04/15  Yes Byrnett, Forest Gleason, MD  Coenzyme Q10 (CO Q 10) 100 MG CAPS Take 100 mg by mouth daily.   Yes [provider]  Cromolyn Sodium (NASAL ALLERGY NA) Place 1 spray into the nose daily. X Clear    Yes [provider]  diphenhydrAMINE (BENADRYL) 25 MG tablet Take 1 tablet (25 mg total) by mouth every 6 (six) hours. 06/09/14  Yes Betancourt, Aura Fey, NP  furosemide (LASIX) 40 MG tablet Take 40 mg by mouth daily.  12/01/11  Yes Einar Pheasant, MD  Garlic 3762 MG CAPS Take 1,000 mg by mouth daily.   Yes [provider]  isosorbide mononitrate (IMDUR) 60 MG 24 hr tablet Take 60-120 mg by mouth 2 (two) times daily. Take 120 mg by mouth in the morning and 60 mg by mouth in the evening.   Yes [provider]  KRILL OIL PO Take 2,000 mg by mouth daily.    Yes [provider]  losartan (COZAAR) 25 MG tablet Take 25 mg by mouth daily.    Yes [provider]  Magnesium 250 MG TABS Take 500 mg by mouth at bedtime.    Yes [provider]  nitroGLYCERIN (NITROSTAT) 0.4 MG SL tablet Place 1 tablet (0.4 mg total) under the tongue every 5 (five) minutes as needed for chest pain. 12/01/11  Yes Einar Pheasant, MD  OIL OF OREGANO PO Take 1,920 mg by mouth daily.   Yes [provider]  TURMERIC PO Take 2 tablets by mouth daily.    Yes [provider]  vitamin E (VITAMIN E) 400 UNIT capsule Take 400 Units by mouth every Monday, Wednesday, and Friday.    Yes [provider]  rosuvastatin (CRESTOR) 5 MG tablet Take 1 tablet (5 mg total) by mouth daily. Patient not taking: Reported on 05/17/2017 05/14/16   Einar Pheasant, MD      PHYSICAL EXAMINATION:   VITAL SIGNS: Blood pressure 134/86, pulse 97, temperature 97.7 F (36.5 C), temperature source Oral,  resp. rate 19, weight 73 kg (161 lb), SpO2 96 %.  GENERAL:  67 y.o.-year-old patient lying in the bed with no acute distress.  EYES: Pupils equal, round, reactive to light and accommodation. No scleral icterus. Extraocular muscles intact.  HEENT: Head atraumatic, normocephalic. Oropharynx and nasopharynx clear.  NECK:  Supple, no jugular venous distention. No thyroid enlargement, no tenderness.  LUNGS: Decreased breath sounds bilaterally, bibasilar crepitations heard. No use of accessory muscles of respiration.  CARDIOVASCULAR: S1, S2 normal.  No murmurs, rubs, or gallops.  ABDOMEN: Soft, nontender, nondistended. Bowel sounds present. No organomegaly or mass.  EXTREMITIES: No pedal edema, cyanosis, or clubbing.  NEUROLOGIC: Cranial nerves II through XII are intact. Muscle strength 5/5 in all extremities. Sensation intact. Gait not checked.  PSYCHIATRIC: The patient is alert and oriented x 3.  SKIN: No obvious rash, lesion, or ulcer.   LABORATORY PANEL:   CBC Recent Labs  Lab 05/17/17 1928  WBC 7.6  HGB 14.7  HCT 43.6  PLT 266  MCV 91.3  MCH 30.8  MCHC 33.8  RDW 14.7*   ------------------------------------------------------------------------------------------------------------------  Chemistries  Recent Labs  Lab 05/17/17 1928  NA 136  K 3.0*  CL 101  CO2 24  GLUCOSE 173*  BUN 13  CREATININE 0.91  CALCIUM 9.4  AST 28  ALT 21  ALKPHOS 61  BILITOT 0.9   ------------------------------------------------------------------------------------------------------------------ estimated creatinine clearance is 59.8 mL/min (by C-G formula based on SCr of 0.91 mg/dL). ------------------------------------------------------------------------------------------------------------------ No results for input(s): TSH, T4TOTAL, T3FREE, THYROIDAB in the last 72 hours.  Invalid input(s): FREET3   Coagulation profile No results for input(s): INR, PROTIME in the last 168  hours. ------------------------------------------------------------------------------------------------------------------- No results for input(s): DDIMER in the last 72 hours. -------------------------------------------------------------------------------------------------------------------  Cardiac Enzymes Recent Labs  Lab 05/17/17 1928  TROPONINI 0.05*   ------------------------------------------------------------------------------------------------------------------ Invalid input(s): POCBNP  ---------------------------------------------------------------------------------------------------------------  Urinalysis    Component Value Date/Time   COLORURINE Yellow 02/02/2013 0207   COLORURINE LT. YELLOW 01/04/2013 0929   APPEARANCEUR Hazy 02/02/2013 0207   LABSPEC 1.008 02/02/2013 0207   PHURINE 6.0 02/02/2013 0207   PHURINE 6.0 01/04/2013 0929   GLUCOSEU >=500 02/02/2013 0207   GLUCOSEU NEGATIVE 01/04/2013 0929   HGBUR Negative 02/02/2013 0207   HGBUR NEGATIVE 01/04/2013 0929   BILIRUBINUR Negative 02/02/2013 0207   KETONESUR Negative 02/02/2013 0207   KETONESUR NEGATIVE 01/04/2013 0929   PROTEINUR 100 mg/dL 02/02/2013 0207   UROBILINOGEN 0.2 01/04/2013 0929   NITRITE Negative 02/02/2013 0207   NITRITE NEGATIVE 01/04/2013 0929   LEUKOCYTESUR Negative 02/02/2013 0207     RADIOLOGY: Dg Chest Portable 1 View  Result Date: 05/17/2017 CLINICAL DATA:  67 y/o  F; shortness of breath today.  Technique. EXAM: PORTABLE CHEST 1 VIEW COMPARISON:  02/02/2013 chest radiograph FINDINGS: Stable normal cardiac silhouette given projection and technique. Post median sternotomy. Diffuse reticular and peripheral linear opacities of the lungs. Hazy opacities at lung bases. Blunted costal diaphragmatic angles. No pneumothorax. Bones are unremarkable. IMPRESSION: Diffuse reticular and hazy basilar opacities, probably interstitial and alveolar pulmonary edema. Small pleural effusions.  Electronically Signed   By: Kristine Garbe M.D.   On: 05/17/2017 20:07    EKG: Orders placed or performed during the hospital encounter of 05/17/17  . EKG 12-Lead  . EKG 12-Lead    IMPRESSION AND PLAN:  66 year old female patient with history of coronary disease, CABG, hyperlipidemia, hypertension, peripheral vascular disease presented to the emergency room with shortness of breath.  1.  Acute pulmonary edema IV Lasix for diuresis Check echocardiogram to assess LV function Cardiology consultation Input output chart Oxygen via nasal cannula  2 hypokalemia.  Replace potassium orally  3.  Elevated troponin Could be from demand ischemia Cycle troponin and cardiology consult  4.  DVT prophylaxis Lovenox 40 mg daily  5.  Coronary artery disease Continue Plavix, Imdur  and beta-blocker  All the records are reviewed and case discussed with ED provider. Management plans discussed with the patient, family and they are in agreement.  CODE STATUS:Full  code    TOTAL TIME TAKING CARE OF THIS PATIENT: 52 minutes.    Saundra Shelling M.D on 05/17/2017 at 9:19 PM  Between 7am to 6pm - Pager - [phone]  After 6pm go to [email or web address] - password [email or web address] [place]  Office  [phone]  CC: Primary care physician; Einar Pheasant, MD

## 2017-05-17 NOTE — ED Notes (Signed)
Spo2 90-92% on 2L [place].  Increased her O2 to 3l [place] at this time

## 2017-05-17 NOTE — Progress Notes (Signed)
Advanced care plan.  Purpose of the Encounter: CODE STATUS  Parties in Attendance:Patient  Patient's Decision Capacity:Good  Subjective/Patient's story: Presented with shortness of breath  Objective/Medical story Patient has pulmonary edema and vascular congestion and and heart failure admitted for diuresis with Lasix  Goals of care determination:  Advanced directives discussed with patient Patient wants everything done cardiac resuscitation, intubation and ventilator if the need arises  CODE STATUS: Full code  Time spent discussing advanced care planning: 16 minutes

## 2017-05-17 NOTE — ED Provider Notes (Addendum)
[facility] Emergency Department Provider Note  ____________________________________________  Time seen: Approximately 7:10 PM  I have reviewed the triage vital signs and the nursing notes.   HISTORY  Chief Complaint Shortness of Breath    HPI Jennifer [name] is a 67 y.o. female with a history of CAD status post CABG, CHF, HTN, HL and peripheral vascular disease presenting with acute onset of shortness of breath.  The patient reports that she was doing well until this afternoon when she went to get a massage.  Driving home, she became fairly acutely short of breath and felt like she could not even take a step to get back into her home when she arrived into her driveway.  EMS noted her O2 sats to be in the high 90s on room air and for her to have crackles in the lower bases bilaterally.  She went to 97% on a full facemask but only the low 90s on 2 L nasal cannula.  She was also given a sublingual nitro and nitroglycerin paste.  The patient has mild associated chest tightness but denies any chest pain, palpitations, lightheadedness or syncope.  No lower extremity swelling or calf pain.  No recent cough or cold symptoms, fever or chills.  Past Medical History:  Diagnosis Date  . CAD (coronary artery disease) 2003   s/p CABG x 5 (Dr Evelina Dun), [place]- 2010  . Endometriosis   . Hypercholesteremia   . Hypertension   . PVD (peripheral vascular disease) ([place])    s/p intervention (left) - 2004, right (2006)    Patient Active Problem List   Diagnosis Date Noted  . Carotid stenosis 01/01/2017  . Atherosclerosis of native arteries of extremity with intermittent claudication ([place]) 06/19/2016  . Encounter for screening colonoscopy 09/21/2015  . Acute sinusitis 09/03/2015  . Carotid bruit 11/20/2014  . Health care maintenance 05/25/2014  . Plantar fasciitis 01/08/2013  . Peripheral vascular disease ([place]) 12/07/2011  . CAD (coronary artery disease) 12/01/2011  . Hypertension  12/01/2011  . Hypercholesteremia 12/01/2011    Past Surgical History:  Procedure Laterality Date  . ABDOMINAL HYSTERECTOMY    . BREAST SURGERY  2004   cyst removed  . cataract surgery  2007   left  . COLONOSCOPY WITH PROPOFOL N/A 10/30/2015   Procedure: COLONOSCOPY WITH PROPOFOL;  Surgeon: Robert Bellow, MD;  Location: [facility] ENDOSCOPY;  Service: Endoscopy;  Laterality: N/A;  . CORONARY ARTERY BYPASS GRAFT     2003    Current Outpatient Rx  . Order #: [id number] Class: Print  . Order #: [id number] Class: Historical Med  . Order #: [id number] Class: Historical Med  . Order #: [id number] Class: Print  . Order #: [id number] Class: Historical Med  . Order #: [id number] Class: Print  . Order #: [id number] Class: Historical Med  . Order #: [id number] Class: Historical Med  . Order #: [id number] Class: OTC  . Order #: [id number] Class: Normal  . Order #: [id number] Class: Historical Med  . Order #: [id number] Class: Historical Med  . Order #: [id number] Class: Historical Med  . Order #: [id number] Class: Historical Med  . Order #: [id number] Class: Historical Med  . Order #: [id number] Class: Historical Med  . Order #: [id number] Class: Print  . Order #: [id number] Class: Historical Med  . Order #: [id number] Class: Historical Med  . Order #: [id number] Class: Historical Med  . Order #: [id number] Class: Normal  . Order #: [id number] Class: Historical Med  . Order #: [id number] Class: Historical Med  . Order #: [id number] Class: Historical Med    Allergies Cyclobenzaprine and Prednisone  Family History  Problem Relation Age of Onset  . Heart disease Mother   . CVA Mother   . Heart disease Father        CABG  . Heart disease Unknown        aunts and uncles  . Breast cancer Neg Hx   . Colon cancer Neg Hx     Social History Social History   Tobacco Use  . Smoking status: Former Smoker    Last attempt to quit: 09/03/2008    Years since quitting: 8.7  . Smokeless tobacco: Never Used  Substance Use Topics  .  Alcohol use: Yes    Alcohol/week: 0.0 oz    Comment: once a year  . Drug use: No    Review of Systems Constitutional: No fever/chills.  Lightheadedness or syncope. Eyes: No visual changes. ENT: No sore throat. No congestion or rhinorrhea. Cardiovascular: Denies chest pain but does have chest tightness. Denies palpitations. Respiratory: Positive shortness of breath.  No cough. Gastrointestinal: No abdominal pain.  No nausea, no vomiting.  No diarrhea.  No constipation. Genitourinary: Negative for dysuria. Musculoskeletal: Negative for back pain.  No lower extremity swelling or calf pain. Skin: Negative for rash. Neurological: Negative for headaches. No focal numbness, tingling or weakness.     ____________________________________________   PHYSICAL EXAM:  VITAL SIGNS: ED Triage Vitals [05/17/17 1909]  Enc Vitals Group     BP      Pulse      Resp      Temp      Temp src      SpO2 (!) 88 %     Weight      Height      Head Circumference      Peak Flow      Pain Score      Pain Loc      Pain Edu?      Excl. in [place]?     Constitutional: Alert and oriented. Well appearing and in no acute distress. Answers questions appropriately. Eyes: Conjunctivae are normal.  EOMI. No scleral icterus. Head: Atraumatic. Nose: No congestion/rhinnorhea. Mouth/Throat: Mucous membranes are moist.  Neck: No stridor.  Supple.  Mild JVD.  No meningismus. Cardiovascular: Fast rate, regular rhythm. No murmurs, rubs or gallops.  Respiratory: The patient is tachypneic with accessory muscle use and retractions.  She has rales in the bases bilaterally.  No wheezes.  She is able to speak in 2 or 3 word sentences.  Gastrointestinal: Soft, nontender and nondistended.  No guarding or rebound.  No peritoneal signs. Musculoskeletal: No LE edema. No ttp in the calves or palpable cords.  Negative Homan's sign. Neurologic:  A&Ox3.  Speech is clear.  Face and smile are symmetric.  EOMI.  Moves all extremities  well. Skin:  Skin is warm, dry and intact. No rash noted. Psychiatric: Mood and affect are normal. Speech and behavior are normal.  Normal judgement  ____________________________________________   LABS (all labs ordered are listed, but only abnormal results are displayed)  Labs Reviewed  CBC - Abnormal; Notable for the following components:      Result Value   RDW 14.7 (*)    All other components within normal limits  COMPREHENSIVE METABOLIC PANEL - Abnormal; Notable for the following components:   Potassium 3.0 (*)    Glucose, Bld 173 (*)    All other components within normal limits  TROPONIN I - Abnormal; Notable for the following components:   Troponin I 0.05 (*)    All  other components within normal limits  BLOOD GAS, VENOUS - Abnormal; Notable for the following components:   pCO2, Ven 42 (*)    All other components within normal limits  BRAIN NATRIURETIC PEPTIDE   ____________________________________________  EKG  ED ECG REPORT I, Eula Listen, the attending physician, personally viewed and interpreted this ECG.   Date: 05/17/2017  EKG Time: 1917  Rate: 105  Rhythm: sinus tachycardia  Axis: leftward  Intervals:prolonged Qtc  ST&T Change: Pt has widened QRS w/ LBBB but does not meet [place] criteria.  This EKG is compared to to prior EKGs from 2015 and 2011, which do not show a true left bundle branch block.  We do not have any more recent EKGs  ____________________________________________  RADIOLOGY  Dg Chest Portable 1 View  Result Date: 05/17/2017 CLINICAL DATA:  67 y/o  F; shortness of breath today.  Technique. EXAM: PORTABLE CHEST 1 VIEW COMPARISON:  02/02/2013 chest radiograph FINDINGS: Stable normal cardiac silhouette given projection and technique. Post median sternotomy. Diffuse reticular and peripheral linear opacities of the lungs. Hazy opacities at lung bases. Blunted costal diaphragmatic angles. No pneumothorax. Bones are unremarkable.  IMPRESSION: Diffuse reticular and hazy basilar opacities, probably interstitial and alveolar pulmonary edema. Small pleural effusions. Electronically Signed   By: Kristine Garbe M.D.   On: 05/17/2017 20:07    ____________________________________________   PROCEDURES  Procedure(s) performed: None  Procedures  Critical Care performed: Yes, see critical care note(s) ____________________________________________   INITIAL IMPRESSION / ASSESSMENT AND PLAN / ED COURSE  Pertinent labs & imaging results that were available during my care of the patient were reviewed by me and considered in my medical decision making (see chart for details).  67 y.o. female with a history of CAD status post CABG, CHF, presenting with acute shortness of breath after massage today.  Overall, the patient's examination is concerning for respiratory distress.  I am concerned about fluid overload and pulmonary edema, flash pulmonary edema, possibly triggered by her position or manipulation during the massage.  We will continue her nitroglycerin paste and give her a dose of Lasix.  I will place her on BiPAP as the patient is hypoxic on room air.  Consider ACS or MI.  PE is less likely.  Plan admission for continued evaluation and treatment.  ----------------------------------------- 8:16 PM on 05/17/2017 -----------------------------------------  The patient's blood gas is reassuring with a normal pH and normal oxygenation.  Her PCO2 is 42, which is likely from her tachypnea.  Her white blood cell count is normal and there is no evidence of infection.  Her electrolytes show hypokalemia which has been supplemented.  Her chest x-ray is consistent with pulmonary edema with interstitial edema plus small pleural effusions.  She has received Lasix for this.  She also has an elevated troponin, which will need to be trended.  At this time, we will plan to admit the patient for continued evaluation and  treatment.  CRITICAL CARE Performed by: Eula Listen   Total critical care time: 35 minutes  Critical care time was exclusive of separately billable procedures and treating other patients.  Critical care was necessary to treat or prevent imminent or life-threatening deterioration.  Critical care was time spent personally by me on the following activities: development of treatment plan with patient and/or surrogate as well as nursing, discussions with consultants, evaluation of patient's response to treatment, examination of patient, obtaining history from patient or surrogate, ordering and performing treatments and interventions, ordering and review of laboratory studies, ordering  and review of radiographic studies, pulse oximetry and re-evaluation of patient's condition.   ____________________________________________  FINAL CLINICAL IMPRESSION(S) / ED DIAGNOSES  Final diagnoses:  Acute on chronic congestive heart failure, unspecified heart failure type (HCC)  Elevated troponin  Hypoxia  Hypokalemia         NEW MEDICATIONS STARTED DURING THIS VISIT:  New Prescriptions   No medications on file      Eula Listen, MD 05/17/17 1931    Eula Listen, MD 05/17/17 2018

## 2017-05-17 NOTE — ED Triage Notes (Signed)
Pt is here with sob that began today.  88% on RA, tachypnea and pt feels need to sit on side of bed.  No CP with this.  Pt reports that she has had congestion and HA.  HX of MI in 2010

## 2017-05-18 ENCOUNTER — Other Ambulatory Visit: Payer: Self-pay

## 2017-05-18 ENCOUNTER — Inpatient Hospital Stay
Admit: 2017-05-18 | Discharge: 2017-05-18 | Disposition: A | Discharge status: Home / Self Care | Payer: Medicare Other | Attending: Internal Medicine | Admitting: Internal Medicine

## 2017-05-18 DIAGNOSIS — R9431 Abnormal electrocardiogram [ECG] [EKG]: Secondary | ICD-10-CM

## 2017-05-18 LAB — CBC
HCT: 40 % (ref 35.0–47.0)
HEMATOCRIT: 41.4 % (ref 35.0–47.0)
HEMOGLOBIN: 14.3 g/dL (ref 12.0–16.0)
Hemoglobin: 13.7 g/dL (ref 12.0–16.0)
MCH: 31.5 pg (ref 26.0–34.0)
MCH: 31.2 pg (ref 26.0–34.0)
MCHC: 34.6 g/dL (ref 32.0–36.0)
MCHC: 34.4 g/dL (ref 32.0–36.0)
MCV: 91.3 fL (ref 80.0–100.0)
MCV: 90.9 fL (ref 80.0–100.0)
PLATELETS: 213 10*3/uL (ref 150–440)
Platelets: 235 10*3/uL (ref 150–440)
RBC: 4.53 MIL/uL (ref 3.80–5.20)
RBC: 4.4 MIL/uL (ref 3.80–5.20)
RDW: 15 % — ABNORMAL HIGH (ref 11.5–14.5)
RDW: 15 % — AB (ref 11.5–14.5)
WBC: 7.2 10*3/uL (ref 3.6–11.0)
WBC: 6 10*3/uL (ref 3.6–11.0)

## 2017-05-18 LAB — TROPONIN I
TROPONIN I: 1.4 ng/mL — AB (ref <0.03)
TROPONIN I: 0.93 ng/mL — AB (ref <0.03)
Troponin I: 0.99 ng/mL (ref <0.03)

## 2017-05-18 LAB — BASIC METABOLIC PANEL
Anion gap: 8 (ref 5–15)
BUN: 14 mg/dL (ref 6–20)
CO2: 27 mmol/L (ref 22–32)
CREATININE: 0.85 mg/dL (ref 0.44–1.00)
Calcium: 9.2 mg/dL (ref 8.9–10.3)
Chloride: 100 mmol/L — ABNORMAL LOW (ref 101–111)
GFR calc Af Amer: >60 mL/min (ref >60)
Glucose, Bld: 99 mg/dL (ref 65–99)
Potassium: 3.7 mmol/L (ref 3.5–5.1)
SODIUM: 135 mmol/L (ref 135–145)

## 2017-05-18 LAB — ECHOCARDIOGRAM COMPLETE
Height: 67 in
Weight: 2526.4 oz

## 2017-05-18 LAB — PROTIME-INR
INR: 1
PROTHROMBIN TIME: 13.1 s (ref 11.4–15.2)

## 2017-05-18 LAB — CREATININE, SERUM: CREATININE: 0.77 mg/dL (ref 0.44–1.00)

## 2017-05-18 LAB — HEPARIN LEVEL (UNFRACTIONATED)
HEPARIN UNFRACTIONATED: 0.12 [IU]/mL — AB (ref 0.30–0.70)
Heparin Unfractionated: 0.31 IU/mL (ref 0.30–0.70)

## 2017-05-18 LAB — APTT: aPTT: 30 seconds (ref 24–36)

## 2017-05-18 MED ORDER — ONDANSETRON HCL 4 MG PO TABS
4.0000 mg | ORAL_TABLET | Freq: Four times a day (QID) | ORAL | Status: DC | PRN
Start: 2017-05-18 — End: 2017-05-19

## 2017-05-18 MED ORDER — ONDANSETRON HCL 4 MG/2ML IJ SOLN: 4.0000 mg | Freq: Four times a day (QID) | INTRAMUSCULAR | Status: DC | PRN

## 2017-05-18 MED ORDER — LORATADINE 10 MG PO TABS
10.0000 mg | ORAL_TABLET | Freq: Every day | ORAL | Status: DC
  Administered 2017-05-18 – 2017-05-19 (×2): 10 mg via ORAL
  Filled 2017-05-18 (×2): qty 1

## 2017-05-18 MED ORDER — NITROGLYCERIN 0.4 MG SL SUBL: 0.4000 mg | SUBLINGUAL_TABLET | SUBLINGUAL | Status: DC | PRN

## 2017-05-18 MED ORDER — SALINE SPRAY 0.65 % NA SOLN
1.0000 | NASAL | Status: DC | PRN
  Administered 2017-05-18: 14:00:00 1 via NASAL
  Filled 2017-05-18: qty 44

## 2017-05-18 MED ORDER — SODIUM CHLORIDE 0.9% FLUSH: 3.0000 mL | INTRAVENOUS | Status: DC | PRN

## 2017-05-18 MED ORDER — HEPARIN BOLUS VIA INFUSION
4000.0000 [IU] | Freq: Once | INTRAVENOUS | Status: AC
  Administered 2017-05-18: 06:00:00 4000 [IU] via INTRAVENOUS
  Filled 2017-05-18: qty 4000

## 2017-05-18 MED ORDER — HEPARIN (PORCINE) IN NACL 100-0.45 UNIT/ML-% IJ SOLN
950.0000 [IU]/h | INTRAMUSCULAR | Status: DC
  Administered 2017-05-18: 850 [IU]/h via INTRAVENOUS
  Filled 2017-05-18: qty 250

## 2017-05-18 MED ORDER — SODIUM CHLORIDE 0.9% FLUSH
3.0000 mL | Freq: Two times a day (BID) | INTRAVENOUS | Status: DC
  Administered 2017-05-18 – 2017-05-19 (×4): 3 mL via INTRAVENOUS

## 2017-05-18 MED ORDER — PERFLUTREN LIPID MICROSPHERE
1.0000 mL | INTRAVENOUS | Status: AC | PRN
  Administered 2017-05-18: 3 mL via INTRAVENOUS
  Filled 2017-05-18: qty 10

## 2017-05-18 MED ORDER — SIMVASTATIN 20 MG PO TABS
40.0000 mg | ORAL_TABLET | Freq: Every day | ORAL | Status: DC
  Administered 2017-05-18: 17:00:00 40 mg via ORAL
  Filled 2017-05-18: qty 2

## 2017-05-18 MED ORDER — ACETAMINOPHEN 325 MG PO TABS
650.0000 mg | ORAL_TABLET | Freq: Four times a day (QID) | ORAL | Status: DC | PRN
  Administered 2017-05-18: 22:00:00 650 mg via ORAL
  Filled 2017-05-18: qty 2

## 2017-05-18 MED ORDER — SODIUM CHLORIDE 0.9 % IV SOLN: 250.0000 mL | INTRAVENOUS | Status: DC | PRN

## 2017-05-18 MED ORDER — ASPIRIN EC 81 MG PO TBEC
81.0000 mg | DELAYED_RELEASE_TABLET | Freq: Every day | ORAL | Status: DC
  Administered 2017-05-18: 81 mg via ORAL
  Filled 2017-05-18: qty 1

## 2017-05-18 MED ORDER — VITAMIN D 1000 UNITS PO TABS
2000.0000 [IU] | ORAL_TABLET | Freq: Two times a day (BID) | ORAL | Status: DC
  Administered 2017-05-18 – 2017-05-19 (×3): 2000 [IU] via ORAL
  Filled 2017-05-18 (×3): qty 2

## 2017-05-18 MED ORDER — ACETAMINOPHEN 650 MG RE SUPP: 650.0000 mg | Freq: Four times a day (QID) | RECTAL | Status: DC | PRN

## 2017-05-18 MED ORDER — MAGNESIUM 250 MG PO TABS: 500.0000 mg | ORAL_TABLET | Freq: Every day | ORAL | Status: DC

## 2017-05-18 MED ORDER — ISOSORBIDE MONONITRATE ER 30 MG PO TB24
60.0000 mg | ORAL_TABLET | Freq: Every day | ORAL | Status: DC
  Administered 2017-05-18: 20:00:00 60 mg via ORAL
  Filled 2017-05-18: qty 2

## 2017-05-18 MED ORDER — CARVEDILOL 3.125 MG PO TABS
3.1250 mg | ORAL_TABLET | Freq: Two times a day (BID) | ORAL | Status: DC
  Administered 2017-05-18 (×2): 3.125 mg via ORAL
  Filled 2017-05-18 (×2): qty 1

## 2017-05-18 MED ORDER — MAGNESIUM OXIDE 400 (241.3 MG) MG PO TABS
400.0000 mg | ORAL_TABLET | Freq: Every day | ORAL | Status: DC
  Administered 2017-05-18: 400 mg via ORAL
  Filled 2017-05-18: qty 1

## 2017-05-18 MED ORDER — ENOXAPARIN SODIUM 40 MG/0.4ML ~~LOC~~ SOLN: 40.0000 mg | SUBCUTANEOUS | Status: DC

## 2017-05-18 MED ORDER — ISOSORBIDE MONONITRATE ER 30 MG PO TB24: 120.0000 mg | ORAL_TABLET | Freq: Every morning | ORAL | Status: DC

## 2017-05-18 MED ORDER — HEPARIN BOLUS VIA INFUSION
2200.0000 [IU] | Freq: Once | INTRAVENOUS | Status: AC
  Administered 2017-05-18: 2200 [IU] via INTRAVENOUS
  Filled 2017-05-18: qty 2200

## 2017-05-18 MED ORDER — CO Q 10 100 MG PO CAPS: 100.0000 mg | ORAL_CAPSULE | Freq: Every day | ORAL | Status: DC

## 2017-05-18 MED ORDER — LOSARTAN POTASSIUM 25 MG PO TABS
25.0000 mg | ORAL_TABLET | Freq: Every day | ORAL | Status: DC
  Administered 2017-05-18: 25 mg via ORAL
  Filled 2017-05-18: qty 1

## 2017-05-18 MED ORDER — CLOPIDOGREL BISULFATE 75 MG PO TABS
75.0000 mg | ORAL_TABLET | Freq: Every day | ORAL | Status: DC
  Administered 2017-05-18 – 2017-05-19 (×2): 75 mg via ORAL
  Filled 2017-05-18 (×2): qty 1

## 2017-05-18 MED ORDER — ISOSORBIDE MONONITRATE ER 30 MG PO TB24
60.0000 mg | ORAL_TABLET | Freq: Two times a day (BID) | ORAL | Status: DC
  Administered 2017-05-18: 120 mg via ORAL
  Filled 2017-05-18: qty 4

## 2017-05-18 MED ORDER — SENNOSIDES-DOCUSATE SODIUM 8.6-50 MG PO TABS: 1.0000 | ORAL_TABLET | Freq: Every evening | ORAL | Status: DC | PRN

## 2017-05-18 NOTE — Progress Notes (Signed)
[place] [facility] [place] NAME: Jennifer [name]    MR#:  [id number]  DATE OF BIRTH:  07-02-1950  SUBJECTIVE:  CHIEF COMPLAINT:   Chief Complaint  Patient presents with  . Shortness of Breath  Patient denies chest pain or shortness of breath now, comfortable  REVIEW OF SYSTEMS:  CONSTITUTIONAL: No fever, fatigue or weakness.  EYES: No blurred or double vision.  EARS, NOSE, AND THROAT: No tinnitus or ear pain.  RESPIRATORY: No cough, shortness of breath, wheezing or hemoptysis.  CARDIOVASCULAR: No chest pain, orthopnea, edema.  GASTROINTESTINAL: No nausea, vomiting, diarrhea or abdominal pain.  GENITOURINARY: No dysuria, hematuria.  ENDOCRINE: No polyuria, nocturia,  HEMATOLOGY: No anemia, easy bruising or bleeding SKIN: No rash or lesion. MUSCULOSKELETAL: No joint pain or arthritis.   NEUROLOGIC: No tingling, numbness, weakness.  PSYCHIATRY: No anxiety or depression.   ROS  DRUG ALLERGIES:   Allergies  Allergen Reactions  . Cyclobenzaprine Other (See Comments)    Felt bad  . Prednisone Other (See Comments)    Felt bad    VITALS:  Blood pressure (!) 97/52, pulse 87, temperature 97.7 F (36.5 C), temperature source Oral, resp. rate 15, height 5\' 7"  (1.702 m), weight 71.6 kg (157 lb 14.4 oz), SpO2 98 %.  PHYSICAL EXAMINATION:  GENERAL:  67 y.o.-year-old patient lying in the bed with no acute distress.  EYES: Pupils equal, round, reactive to light and accommodation. No scleral icterus. Extraocular muscles intact.  HEENT: Head atraumatic, normocephalic. Oropharynx and nasopharynx clear.  NECK:  Supple, no jugular venous distention. No thyroid enlargement, no tenderness.  LUNGS: Normal breath sounds bilaterally, no wheezing, rales,rhonchi or crepitation. No use of accessory muscles of respiration.  CARDIOVASCULAR: S1, S2 normal. No murmurs, rubs, or gallops.  ABDOMEN: Soft, nontender, nondistended. Bowel sounds present. No organomegaly or  mass.  EXTREMITIES: No pedal edema, cyanosis, or clubbing.  NEUROLOGIC: Cranial nerves II through XII are intact. Muscle strength 5/5 in all extremities. Sensation intact. Gait not checked.  PSYCHIATRIC: The patient is alert and oriented x 3.  SKIN: No obvious rash, lesion, or ulcer.   Physical Exam LABORATORY PANEL:   CBC Recent Labs  Lab 05/18/17 0443  WBC 6.0  HGB 13.7  HCT 40.0  PLT 213   ------------------------------------------------------------------------------------------------------------------  Chemistries  Recent Labs  Lab 05/17/17 1928  05/18/17 0443  NA 136  --  135  K 3.0*  --  3.7  CL 101  --  100*  CO2 24  --  27  GLUCOSE 173*  --  99  BUN 13  --  14  CREATININE 0.91   < > 0.85  CALCIUM 9.4  --  9.2  AST 28  --   --   ALT 21  --   --   ALKPHOS 61  --   --   BILITOT 0.9  --   --    < > = values in this interval not displayed.   ------------------------------------------------------------------------------------------------------------------  Cardiac Enzymes Recent Labs  Lab 05/18/17 0232 05/18/17 1011  TROPONINI 1.40* 0.99*   ------------------------------------------------------------------------------------------------------------------  RADIOLOGY:  Dg Chest Portable 1 View  Result Date: 05/17/2017 CLINICAL DATA:  67 y/o  F; shortness of breath today.  Technique. EXAM: PORTABLE CHEST 1 VIEW COMPARISON:  02/02/2013 chest radiograph FINDINGS: Stable normal cardiac silhouette given projection and technique. Post median sternotomy. Diffuse reticular and peripheral linear opacities of the lungs. Hazy opacities at lung bases. Blunted costal diaphragmatic angles. No pneumothorax. Bones are  unremarkable. IMPRESSION: Diffuse reticular and hazy basilar opacities, probably interstitial and alveolar pulmonary edema. Small pleural effusions. Electronically Signed   By: Kristine Garbe M.D.   On: 05/17/2017 20:07    ASSESSMENT AND PLAN:   67 year old female patient with history of coronary disease, CABG, hyperlipidemia, hypertension, peripheral vascular disease presented to the emergency room with shortness of breath.  * Acute congestive heart failure exacerbation  Cannot rule out possible non-STEMI  Resolving  Continue congestive heart failure protocol, follow-up on echocardiogram, cardiology to see, strict I&O monitoring, daily weights, supplemental oxygen weaning as tolerated, on heparin drip, Plavix  *Acute elevated troponins  ?  Secondary to non-STEMI versus demand ischemia from above  Continue Coreg, Plavix, hold aspirin while on heparin drip, imdur, losartan, statin therapy  *Acute hypokalemia Repleted  *History of coronary artery disease Status post CABG Plan of care as stated above   Disposition Home in 1-2 days barring any complications  all the records are reviewed and case discussed with Care Management/Social Workerr. Management plans discussed with the patient, family and they are in agreement.  CODE STATUS: full  TOTAL TIME TAKING CARE OF THIS PATIENT: 35 minutes.     POSSIBLE D/C IN 1-2 DAYS, DEPENDING ON CLINICAL CONDITION.   Avel Peace Azariyah Luhrs M.D on 05/18/2017   Between 7am to 6pm - Pager - [phone]  After 6pm go to [email or web address] - password [email or web address] [facility]  Office  [phone]  CC: Primary care physician; Einar Pheasant, MD  Note: This dictation was prepared with Dragon dictation along with smaller phrase technology. Any transcriptional errors that result from this process are unintentional.

## 2017-05-18 NOTE — Progress Notes (Signed)
PHARMACIST - PHYSICIAN ORDER COMMUNICATION  CONCERNING: P&T Medication Policy on Herbal Medications  DESCRIPTION:  This patient's order for:  Co-Q-10  has been noted.  This product(s) is classified as an "herbal" or natural product. Due to a lack of definitive safety studies or FDA approval, nonstandard manufacturing practices, plus the potential risk of unknown drug-drug interactions while on inpatient medications, the Pharmacy and Therapeutics Committee does not permit the use of "herbal" or natural products of this type within [facility].   ACTION TAKEN: The pharmacy department is unable to verify this order at this time. Please reevaluate patient's clinical condition at discharge and address if the herbal or natural product(s) should be resumed at that time.   

## 2017-05-18 NOTE — Progress Notes (Signed)
[place] for heparin drip Indication: chest pain/ACS  Allergies  Allergen Reactions  . Cyclobenzaprine Other (See Comments)    Felt bad  . Prednisone Other (See Comments)    Felt bad    Patient Measurements: Height: 5\' 7"  (170.2 cm) Weight: 157 lb 14.4 oz (71.6 kg) IBW/kg (Calculated) : 61.6 Heparin Dosing Weight: 72 kg  Vital Signs: Temp: 98.2 F (36.8 C) (04/16 1333) Temp Source: Oral (04/16 1333) BP: 127/61 (04/16 1333) Pulse Rate: 81 (04/16 1333)  Labs: Recent Labs    05/17/17 1928 05/18/17 0232 05/18/17 0443 05/18/17 1011 05/18/17 1236 05/18/17 1441  HGB 14.7 14.3 13.7  --   --   --   HCT 43.6 41.4 40.0  --   --   --   PLT 266 235 213  --   --   --   APTT  --   --  30  --   --   --   LABPROT  --   --  13.1  --   --   --   INR  --   --  1.00  --   --   --   HEPARINUNFRC  --   --   --   --  0.31  --   CREATININE 0.91 0.77 0.85  --   --   --   TROPONINI 0.05* 1.40*  --  0.99*  --  0.93*    Estimated Creatinine Clearance: 63.3 mL/min (by C-G formula based on SCr of 0.85 mg/dL).   Medical History: Past Medical History:  Diagnosis Date  . CAD (coronary artery disease) 2003   s/p CABG x 5 (Dr Evelina Dun), [place]- 2010  . Endometriosis   . Hypercholesteremia   . Hypertension   . PVD (peripheral vascular disease) ([place])    s/p intervention (left) - 2004, right (2006)    Medications:  No anticoag in PTA meds  Assessment: Trop 1.40 Heparin infusing at 850 units/hr  4/16 12:00 Heparin level resulted at 0.31  Goal of Therapy:  Heparin level 0.3-0.7 units/ml Monitor platelets by anticoagulation protocol: Yes   Plan:  Continue with current rate and recheck Heparin level in 6 hours.  Paulina Fusi, PharmD, BCPS 05/18/2017 4:27 PM

## 2017-05-18 NOTE — Progress Notes (Signed)
*  PRELIMINARY RESULTS* Echocardiogram 2D Echocardiogram has been performed.  Jennifer [name] Jennifer [name] 05/18/2017, 1:38 PM

## 2017-05-18 NOTE — Progress Notes (Signed)
[place] for heparin drip Indication: chest pain/ACS  Allergies  Allergen Reactions  . Cyclobenzaprine Other (See Comments)    Felt bad  . Prednisone Other (See Comments)    Felt bad    Patient Measurements: Height: 5\' 7"  (170.2 cm) Weight: 157 lb 14.4 oz (71.6 kg) IBW/kg (Calculated) : 61.6 Heparin Dosing Weight: 72 kg  Vital Signs: Temp: 98.2 F (36.8 C) (04/16 1333) Temp Source: Oral (04/16 1333) BP: 127/61 (04/16 1333) Pulse Rate: 81 (04/16 1333)  Labs: Recent Labs    05/17/17 1928 05/18/17 0232 05/18/17 0443 05/18/17 1011 05/18/17 1236 05/18/17 1441 05/18/17 1820  HGB 14.7 14.3 13.7  --   --   --   --   HCT 43.6 41.4 40.0  --   --   --   --   PLT 266 235 213  --   --   --   --   APTT  --   --  30  --   --   --   --   LABPROT  --   --  13.1  --   --   --   --   INR  --   --  1.00  --   --   --   --   HEPARINUNFRC  --   --   --   --  0.31  --  0.12*  CREATININE 0.91 0.77 0.85  --   --   --   --   TROPONINI 0.05* 1.40*  --  0.99*  --  0.93*  --     Estimated Creatinine Clearance: 63.3 mL/min (by C-G formula based on SCr of 0.85 mg/dL).   Medical History: Past Medical History:  Diagnosis Date  . CAD (coronary artery disease) 2003   s/p CABG x 5 (Dr Evelina Dun), [place]- 2010  . Endometriosis   . Hypercholesteremia   . Hypertension   . PVD (peripheral vascular disease) ([place])    s/p intervention (left) - 2004, right (2006)    Medications:  No anticoag in PTA meds  Assessment: Trop 1.40 Heparin infusing at 850 units/hr  4/16 12:00 Heparin level resulted at 0.31  Goal of Therapy:  Heparin level 0.3-0.7 units/ml Monitor platelets by anticoagulation protocol: Yes   Plan:  Continue with current rate and recheck Heparin level in 6 hours.  4/16 18:20 heparin level subtherapeutic x 1. 2200 units iv x 1 bolus and increase rate to 950 units/hr. RN tells me patient was silencing the pump alarm instead of calling for nurse  to adjust (was occluded) - the pump was off for about an hour but there is no indication as to when that hour was. Increase conservatively. Will recheck HL 6 hours after increase.  Paulina Fusi, PharmD, BCPS 05/18/2017 7:00 PM

## 2017-05-18 NOTE — Consult Note (Signed)
Cardiology Consultation Note    [name], MRN: [id number], DOB/AGE: [birth year] 67 y.o. Admit date: 05/17/2017   Date of Consult: 05/18/2017 Primary Physician: Einar Pheasant, MD Primary Cardiologist: Dr. Alfonso Patten, [place], [place]  Chief Complaint: sob Reason for Consultation: Duanne Limerick Requesting MD: Dr. Jerelyn Charles  HPI: Jennifer [name] is a 67 y.o. female with history of  Cad s/p cabg x 5 at [facility] in 2010, history of pvd, hypertension and hyperlipidemia. She recently saw her primary cardiologist and was doing well. She has a know cardiomyopathy with ef of 30-35% by echo done in our hospital in 2015. She developed relatively sudden onset of sob but does admit to a rather gradual worseing in her breathing . She does not do daily weights. She denies chest pain. She has ra mildly elevated serum troponin but appears to have uled out for an acute acs  mi. CXR revealed evidence of chf. She has improved with nasal cannula oxygen and diuresis. EKG revealed nsr with lbbb which is new from 2015.   Past Medical History:  Diagnosis Date  . CAD (coronary artery disease) 2003   s/p CABG x 5 (Dr Evelina Dun), [place]- 2010  . Endometriosis   . Hypercholesteremia   . Hypertension   . PVD (peripheral vascular disease) ([place])    s/p intervention (left) - 2004, right (2006)      Surgical History:  Past Surgical History:  Procedure Laterality Date  . ABDOMINAL HYSTERECTOMY    . BREAST SURGERY  2004   cyst removed  . cataract surgery  2007   left  . COLONOSCOPY WITH PROPOFOL N/A 10/30/2015   Procedure: COLONOSCOPY WITH PROPOFOL;  Surgeon: Robert Bellow, MD;  Location: [facility] ENDOSCOPY;  Service: Endoscopy;  Laterality: N/A;  . CORONARY ARTERY BYPASS GRAFT     2003     Home Meds: Prior to Admission medications   Medication Sig Start Date End Date Taking? Authorizing Provider  ALPRAZolam Duanne Moron) 0.5 MG tablet Take 1 tablet (0.5 mg total) by mouth daily as needed for sleep. Patient taking  differently: Take 0.25 mg by mouth at bedtime.  11/24/16  Yes Einar Pheasant, MD  Ascorbic Acid (VITAMIN C) 1000 MG tablet Take 1,000 mg by mouth 2 (two) times daily.    Yes [provider]  carvedilol (COREG) 3.125 MG tablet Take 1 tablet (3.125 mg total) by mouth 2 (two) times daily with a meal. 12/01/11  Yes Einar Pheasant, MD  Cholecalciferol (CVS D3) 2000 units CAPS Take 2,000 Units by mouth 2 (two) times daily.   Yes [provider]  clopidogrel (PLAVIX) 75 MG tablet Take 1 tablet (75 mg total) by mouth daily. 11/04/15  Yes Byrnett, Forest Gleason, MD  Coenzyme Q10 (CO Q 10) 100 MG CAPS Take 100 mg by mouth daily.   Yes [provider]  Cromolyn Sodium (NASAL ALLERGY NA) Place 1 spray into the nose daily. X Clear    Yes [provider]  diphenhydrAMINE (BENADRYL) 25 MG tablet Take 1 tablet (25 mg total) by mouth every 6 (six) hours. 06/09/14  Yes Betancourt, Aura Fey, NP  furosemide (LASIX) 40 MG tablet Take 40 mg by mouth daily.  12/01/11  Yes Einar Pheasant, MD  Garlic 4034 MG CAPS Take 1,000 mg by mouth daily.   Yes [provider]  isosorbide mononitrate (IMDUR) 60 MG 24 hr tablet Take 60-120 mg by mouth 2 (two) times daily. Take 120 mg by mouth in the morning and 60 mg  by mouth in the evening.   Yes [provider]  KRILL OIL PO Take 2,000 mg by mouth daily.    Yes [provider]  losartan (COZAAR) 25 MG tablet Take 25 mg by mouth daily.    Yes [provider]  Magnesium 250 MG TABS Take 500 mg by mouth at bedtime.    Yes [provider]  nitroGLYCERIN (NITROSTAT) 0.4 MG SL tablet Place 1 tablet (0.4 mg total) under the tongue every 5 (five) minutes as needed for chest pain. 12/01/11  Yes Einar Pheasant, MD  OIL OF OREGANO PO Take 1,920 mg by mouth daily.   Yes [provider]  TURMERIC PO Take 2 tablets by mouth daily.    Yes [provider]  vitamin E (VITAMIN E) 400 UNIT capsule Take 400 Units  by mouth every Monday, Wednesday, and Friday.    Yes [provider]  rosuvastatin (CRESTOR) 5 MG tablet Take 1 tablet (5 mg total) by mouth daily. Patient not taking: Reported on 05/17/2017 05/14/16   Einar Pheasant, MD    Inpatient Medications:  . carvedilol  3.125 mg Oral BID WC  . cholecalciferol  2,000 Units Oral BID  . clopidogrel  75 mg Oral Daily  . furosemide  40 mg Intravenous Q12H  . [START ON 05/19/2017] isosorbide mononitrate  120 mg Oral q morning - 10a  . isosorbide mononitrate  60 mg Oral QHS  . loratadine  10 mg Oral Daily  . losartan  25 mg Oral Daily  . magnesium oxide  400 mg Oral QHS  . simvastatin  40 mg Oral q1800  . sodium chloride flush  3 mL Intravenous Q12H   . sodium chloride      Allergies:  Allergies  Allergen Reactions  . Cyclobenzaprine Other (See Comments)    Felt bad  . Prednisone Other (See Comments)    Felt bad    Social History   Socioeconomic History  . Marital status: Married    Spouse name: Not on file  . Number of children: Not on file  . Years of education: Not on file  . Highest education level: Not on file  Occupational History  . Not on file  Social Needs  . Financial resource strain: Not on file  . Food insecurity:    Worry: Not on file    Inability: Not on file  . Transportation needs:    Medical: Not on file    Non-medical: Not on file  Tobacco Use  . Smoking status: Former Smoker    Last attempt to quit: 09/03/2008    Years since quitting: 8.7  . Smokeless tobacco: Never Used  Substance and Sexual Activity  . Alcohol use: Yes    Alcohol/week: 0.0 oz    Comment: once a year  . Drug use: No  . Sexual activity: Not on file  Lifestyle  . Physical activity:    Days per week: Not on file    Minutes per session: Not on file  . Stress: Not on file  Relationships  . Social connections:    Talks on phone: Not on file    Gets together: Not on file    Attends religious service: Not on file    Active member  of club or organization: Not on file    Attends meetings of clubs or organizations: Not on file    Relationship status: Not on file  . Intimate partner violence:    Fear of current or ex  partner: Not on file    Emotionally abused: Not on file    Physically abused: Not on file    Forced sexual activity: Not on file  Other Topics Concern  . Not on file  Social History Narrative  . Not on file     Family History  Problem Relation Age of Onset  . Heart disease Mother   . CVA Mother   . Heart disease Father        CABG  . Heart disease Unknown        aunts and uncles  . Breast cancer Neg Hx   . Colon cancer Neg Hx      Review of Systems: A 12-system review of systems was performed and is negative except as noted in the HPI.  Labs: Recent Labs    05/17/17 1928 05/18/17 0232 05/18/17 1011 05/18/17 1441  TROPONINI 0.05* 1.40* 0.99* 0.93*   Lab Results  Component Value Date   WBC 6.0 05/18/2017   HGB 13.7 05/18/2017   HCT 40.0 05/18/2017   MCV 90.9 05/18/2017   PLT 213 05/18/2017    Recent Labs  Lab 05/17/17 1928  05/18/17 0443  NA 136  --  135  K 3.0*  --  3.7  CL 101  --  100*  CO2 24  --  27  BUN 13  --  14  CREATININE 0.91   < > 0.85  CALCIUM 9.4  --  9.2  PROT 7.5  --   --   BILITOT 0.9  --   --   ALKPHOS 61  --   --   ALT 21  --   --   AST 28  --   --   GLUCOSE 173*  --  99   < > = values in this interval not displayed.   Lab Results  Component Value Date   CHOL 248 (H) 11/24/2016   HDL 45.60 11/24/2016   LDLCALC 157 07/24/2015   TRIG 238.0 (H) 11/24/2016   No results found for: DDIMER  Radiology/Studies:  Dg Chest Portable 1 View  Result Date: 05/17/2017 CLINICAL DATA:  67 y/o  F; shortness of breath today.  Technique. EXAM: PORTABLE CHEST 1 VIEW COMPARISON:  02/02/2013 chest radiograph FINDINGS: Stable normal cardiac silhouette given projection and technique. Post median sternotomy. Diffuse reticular and peripheral linear opacities of the  lungs. Hazy opacities at lung bases. Blunted costal diaphragmatic angles. No pneumothorax. Bones are unremarkable. IMPRESSION: Diffuse reticular and hazy basilar opacities, probably interstitial and alveolar pulmonary edema. Small pleural effusions. Electronically Signed   By: Kristine Garbe M.D.   On: 05/17/2017 20:07    Wt Readings from Last 3 Encounters:  05/18/17 71.6 kg (157 lb 14.4 oz)  01/01/17 72.5 kg (159 lb 12.8 oz)  11/24/16 72.5 kg (159 lb 12.8 oz)    EKG: nsr with lbbb  Physical Exam:  Blood pressure (!) 103/58, pulse 83, temperature 97.6 F (36.4 C), temperature source Oral, resp. rate 18, height 5\' 7"  (1.702 m), weight 71.6 kg (157 lb 14.4 oz), SpO2 96 %. Body mass index is 24.73 kg/m. General: Well developed, well nourished, in no acute distress. Head: Normocephalic, atraumatic, sclera non-icteric, no xanthomas, nares are without discharge.  Neck: Negative for carotid bruits. JVD not elevated. Lungs: Clear bilaterally to auscultation without wheezes, rales, or rhonchi. Breathing is unlabored. Heart: RRR with S1 S2. No murmurs, rubs, or gallops appreciated. Abdomen: Soft, non-tender, non-distended with normoactive bowel sounds. No hepatomegaly. No rebound/guarding. No  obvious abdominal masses. Msk:  Strength and tone appear normal for age. Extremities: No clubbing or cyanosis. No edema.  Distal pedal pulses are 2+ and equal bilaterally. Neuro: Alert and oriented X 3. No facial asymmetry. No focal deficit. Moves all extremities spontaneously. Psych:  Responds to questions appropriately with a normal affect.     Assessment and Plan  Pt with histor of cad s/p cabg in 2010 times five with nistory of ischemic cardiomyopathy wit ef of 35%. Admitted with sob and chf on cxr. Mild troponin elevation. No chest pain. Improved with diuresis and oxygen. Will review echo to determine if there is new wall motion nabnormality. Will continue to diureses and contineu with  carvediolol, long acting nitrates  And evaluate in am. If stable would consider discharg ewith early follow up with her primary cardiologist. If there are new wall motion abnrmalities will consider further ischemic work up.   Signed, Teodoro Spray MD 05/18/2017, 10:36 PM Pager: [phone]

## 2017-05-18 NOTE — ED Notes (Signed)
Pt is on 2L [place].  She was tolerating RA and denied any sob, spo2 92%.  Call to 1C.  RN to call back

## 2017-05-18 NOTE — Progress Notes (Signed)
ANTICOAGULATION CONSULT NOTE - Initial Consult  Pharmacy Consult for heparin drip Indication: chest pain/ACS  Allergies  Allergen Reactions  . Cyclobenzaprine Other (See Comments)    Felt bad  . Prednisone Other (See Comments)    Felt bad    Patient Measurements: Height: 5\' 7"  (170.2 cm) Weight: 157 lb 14.4 oz (71.6 kg) IBW/kg (Calculated) : 61.6 Heparin Dosing Weight: 72 kg  Vital Signs: Temp: 98.2 F (36.8 C) (04/16 0446) Temp Source: Oral (04/16 0446) BP: 120/49 (04/16 0446) Pulse Rate: 80 (04/16 0446)  Labs: Recent Labs    05/17/17 1928 05/18/17 0232 05/18/17 0443  HGB 14.7 14.3 13.7  HCT 43.6 41.4 40.0  PLT 266 235 213  APTT  --   --  30  LABPROT  --   --  13.1  INR  --   --  1.00  CREATININE 0.91 0.77 0.85  TROPONINI 0.05* 1.40*  --     Estimated Creatinine Clearance: 63.3 mL/min (by C-G formula based on SCr of 0.85 mg/dL).   Medical History: Past Medical History:  Diagnosis Date  . CAD (coronary artery disease) 2003   s/p CABG x 5 (Dr Evelina Dun), [place]- 2010  . Endometriosis   . Hypercholesteremia   . Hypertension   . PVD (peripheral vascular disease) ([place])    s/p intervention (left) - 2004, right (2006)    Medications:  No anticoag in PTA meds  Assessment: Trop 1.40  Goal of Therapy:  Heparin level 0.3-0.7 units/ml Monitor platelets by anticoagulation protocol: Yes   Plan:  4000 unit bolus and initial rate of 850 units/hr. First heparin level 6 hours after start of infusion.  Chrystian Cupples S 05/18/2017,5:56 AM

## 2017-05-19 NOTE — Progress Notes (Signed)
Patient discharged home per MD order. All discharge instructions given and all questions answered. 

## 2017-05-19 NOTE — Care Management (Signed)
Patient admitted with progressive shortness of breath and sudden increase in the intensity.  Supplemental 02 acute.  Diagnosed with CHF/ pulmonary edema. Dietician  referral for diet education.  Provided with Living with Heart Failure Education Booklet.  Has access to scales. No issues accessing medical care, paying for meds or with transportation that would prevent patient from keeping MD appointments

## 2017-05-19 NOTE — Discharge Summary (Signed)
[place] at [place] NAME: Sheketa Ende    MR#:  [id number]  DATE OF BIRTH:  [birth year]  DATE OF ADMISSION:  05/17/2017 ADMITTING PHYSICIAN: Arta Silence, MD  DATE OF DISCHARGE: No discharge date for patient encounter.  PRIMARY CARE PHYSICIAN: Einar Pheasant, MD    ADMISSION DIAGNOSIS:  Hypokalemia [E87.6] Hypoxia [R09.02] Elevated troponin [R74.8] Acute on chronic congestive heart failure, unspecified heart failure type ([place]) [I50.9] Pulmonary edema [J81.1]  DISCHARGE DIAGNOSIS:  Active Problems:   Pulmonary edema   SECONDARY DIAGNOSIS:   Past Medical History:  Diagnosis Date  . CAD (coronary artery disease) 2003   s/p CABG x 5 (Dr Evelina Dun), MI- 2010  . Endometriosis   . Hypercholesteremia   . Hypertension   . PVD (peripheral vascular disease) ([place])    s/p intervention (left) - 2004, right (2006)    HOSPITAL COURSE:  67 year old female patient with history of coronary disease, CABG, hyperlipidemia, hypertension, peripheral vascular disease presented to the emergency room with shortness of breath.  * Acute congestive heart failure exacerbation  Resolved Most likely secondary to dietary indiscretion Treated on our CHF protocol, echocardiogram noted for ejection fraction 40-45%, cardiology to see patient while in house, placed on low-sodium diet, oxygen successfully weaned off, continue Plavix, and patient did well   *Acute elevated troponins  Most likely secondary to above from demand ischemia Continued Coreg, Plavix, treated with heparin drip for short course, aspirin was held due to heparin use, continued imdur, losartan, statin therapy  *Acutehypokalemia Repleted  *History of coronary artery disease Stable Status post CABG Plan of care as stated above     DISCHARGE CONDITIONS:  On the day of discharge patient is afebrile, hemogram stable, tolerating diet, ready for discharge home with  appropriate follow-up, for more specific details please see chart   CONSULTS OBTAINED:  Treatment Team:  Teodoro Spray, MD  DRUG ALLERGIES:   Allergies  Allergen Reactions  . Cyclobenzaprine Other (See Comments)    Felt bad  . Prednisone Other (See Comments)    Felt bad    DISCHARGE MEDICATIONS:   Allergies as of 05/19/2017      Reactions   Cyclobenzaprine Other (See Comments)   Felt bad   Prednisone Other (See Comments)   Felt bad      Medication List    TAKE these medications   ALPRAZolam 0.5 MG tablet Commonly known as:  XANAX Take 1 tablet (0.5 mg total) by mouth daily as needed for sleep. What changed:    how much to take  when to take this   carvedilol 3.125 MG tablet Commonly known as:  COREG Take 1 tablet (3.125 mg total) by mouth 2 (two) times daily with a meal.   clopidogrel 75 MG tablet Commonly known as:  PLAVIX Take 1 tablet (75 mg total) by mouth daily.   Co Q 10 100 MG Caps Take 100 mg by mouth daily.   CVS D3 2000 units Caps Generic drug:  Cholecalciferol Take 2,000 Units by mouth 2 (two) times daily.   diphenhydrAMINE 25 MG tablet Commonly known as:  BENADRYL Take 1 tablet (25 mg total) by mouth every 6 (six) hours.   furosemide 40 MG tablet Commonly known as:  LASIX Take 40 mg by mouth daily.   Garlic 5732 MG Caps Take 1,000 mg by mouth daily.   isosorbide mononitrate 60 MG 24 hr tablet Commonly known as:  IMDUR Take 60-120 mg by mouth 2 (two)  times daily. Take 120 mg by mouth in the morning and 60 mg by mouth in the evening.   KRILL OIL PO Take 2,000 mg by mouth daily.   losartan 25 MG tablet Commonly known as:  COZAAR Take 25 mg by mouth daily.   Magnesium 250 MG Tabs Take 500 mg by mouth at bedtime.   NASAL ALLERGY NA Place 1 spray into the nose daily. X Clear   nitroGLYCERIN 0.4 MG SL tablet Commonly known as:  NITROSTAT Place 1 tablet (0.4 mg total) under the tongue every 5 (five) minutes as needed for chest  pain.   OIL OF OREGANO PO Take 1,920 mg by mouth daily.   rosuvastatin 5 MG tablet Commonly known as:  CRESTOR Take 1 tablet (5 mg total) by mouth daily.   TURMERIC PO Take 2 tablets by mouth daily.   vitamin C 1000 MG tablet Take 1,000 mg by mouth 2 (two) times daily.   vitamin E 400 UNIT capsule Generic drug:  vitamin E Take 400 Units by mouth every Monday, Wednesday, and Friday.        DISCHARGE INSTRUCTIONS:   If you experience worsening of your admission symptoms, develop shortness of breath, life threatening emergency, suicidal or homicidal thoughts you must seek medical attention immediately by calling 911 or calling your MD immediately  if symptoms less severe.  You Must read complete instructions/literature along with all the possible adverse reactions/side effects for all the Medicines you take and that have been prescribed to you. Take any new Medicines after you have completely understood and accept all the possible adverse reactions/side effects.   Please note  You were cared for by a hospitalist during your hospital stay. If you have any questions about your discharge medications or the care you received while you were in the hospital after you are discharged, you can call the unit and asked to speak with the hospitalist on call if the hospitalist that took care of you is not available. Once you are discharged, your primary care physician will handle any further medical issues. Please note that NO REFILLS for any discharge medications will be authorized once you are discharged, as it is imperative that you return to your primary care physician (or establish a relationship with a primary care physician if you do not have one) for your aftercare needs so that they can reassess your need for medications and monitor your lab values.    Today   CHIEF COMPLAINT:   Chief Complaint  Patient presents with  . Shortness of Breath    HISTORY OF PRESENT ILLNESS:  67 y.o.  female with a known history of coronary artery disease, CABG, hyperlipidemia, hypertension, peripheral vascular disease presented to the emergency room with increased shortness of breath.  Patient was doing well until today afternoon when she went for a massage.  While going back home she felt short of breath and had some sudden  cough.  When EMS picked up the patient her O2 sats were in the high 90s and she had some crackles in the lower basis.  She was initially put on a facemask and later on weaned down to oxygen via nasal cannula.  Patient received 60 mg IV Lasix in the emergency room chest x-ray showed vascular congestion and pulmonary edema.  She was in heart failure and she was given Lasix for diuresis.  No complaints of any chest pain.  Patient felt better after being given Lasix for diuresis.  VITAL SIGNS:  Blood  pressure (!) 97/47, pulse 81, temperature 97.9 F (36.6 C), temperature source Oral, resp. rate 18, height 5\' 7"  (1.702 m), weight 71.6 kg (157 lb 14.4 oz), SpO2 95 %.  I/O:    Intake/Output Summary (Last 24 hours) at 05/19/2017 0957 Last data filed at 05/19/2017 0439 Gross per 24 hour  Intake 75.37 ml  Output 3000 ml  Net -2924.63 ml    PHYSICAL EXAMINATION:  GENERAL:  67 y.o.-year-old patient lying in the bed with no acute distress.  EYES: Pupils equal, round, reactive to light and accommodation. No scleral icterus. Extraocular muscles intact.  HEENT: Head atraumatic, normocephalic. Oropharynx and nasopharynx clear.  NECK:  Supple, no jugular venous distention. No thyroid enlargement, no tenderness.  LUNGS: Normal breath sounds bilaterally, no wheezing, rales,rhonchi or crepitation. No use of accessory muscles of respiration.  CARDIOVASCULAR: S1, S2 normal. No murmurs, rubs, or gallops.  ABDOMEN: Soft, non-tender, non-distended. Bowel sounds present. No organomegaly or mass.  EXTREMITIES: No pedal edema, cyanosis, or clubbing.  NEUROLOGIC: Cranial nerves II through XII are  intact. Muscle strength 5/5 in all extremities. Sensation intact. Gait not checked.  PSYCHIATRIC: The patient is alert and oriented x 3.  SKIN: No obvious rash, lesion, or ulcer.   DATA REVIEW:   CBC Recent Labs  Lab 05/18/17 0443  WBC 6.0  HGB 13.7  HCT 40.0  PLT 213    Chemistries  Recent Labs  Lab 05/17/17 1928  05/18/17 0443  NA 136  --  135  K 3.0*  --  3.7  CL 101  --  100*  CO2 24  --  27  GLUCOSE 173*  --  99  BUN 13  --  14  CREATININE 0.91   < > 0.85  CALCIUM 9.4  --  9.2  AST 28  --   --   ALT 21  --   --   ALKPHOS 61  --   --   BILITOT 0.9  --   --    < > = values in this interval not displayed.    Cardiac Enzymes Recent Labs  Lab 05/18/17 1441  TROPONINI 0.93*    Microbiology Results  Results for orders placed or performed in visit on 08/05/15  WET PREP BY MOLECULAR PROBE     Status: None   Collection Time: 08/05/15 11:18 AM  Result Value Ref Range Status   Candida species NEG Negative Final   Trichomonas vaginosis NEG Negative Final   Gardnerella vaginalis NEG Negative Final    RADIOLOGY:  Dg Chest Portable 1 View  Result Date: 05/17/2017 CLINICAL DATA:  67 y/o  F; shortness of breath today.  Technique. EXAM: PORTABLE CHEST 1 VIEW COMPARISON:  02/02/2013 chest radiograph FINDINGS: Stable normal cardiac silhouette given projection and technique. Post median sternotomy. Diffuse reticular and peripheral linear opacities of the lungs. Hazy opacities at lung bases. Blunted costal diaphragmatic angles. No pneumothorax. Bones are unremarkable. IMPRESSION: Diffuse reticular and hazy basilar opacities, probably interstitial and alveolar pulmonary edema. Small pleural effusions. Electronically Signed   By: Kristine Garbe M.D.   On: 05/17/2017 20:07    EKG:   Orders placed or performed during the hospital encounter of 05/17/17  . EKG 12-Lead  . EKG 12-Lead      Management plans discussed with the patient, family and they are in  agreement.  CODE STATUS:     Code Status Orders  (From admission, onward)        Start     Ordered  05/18/17 0147  Full code  Continuous     05/18/17 0147    Code Status History    This patient has a current code status but no historical code status.    Advance Directive Documentation     Most Recent Value  Type of Advance Directive  Healthcare Power of Attorney, Living will  Pre-existing out of facility DNR order (yellow form or pink MOST form)  -  "MOST" Form in Place?  -      TOTAL TIME TAKING CARE OF THIS PATIENT: 45 minutes.    Avel Peace Mir Fullilove M.D on 05/19/2017 at 9:57 AM  Between 7am to 6pm - Pager - [phone]  After 6pm go to [email or web address] - password [email or web address] [facility]  Office  [phone]  CC: Primary care physician; Einar Pheasant, MD   Note: This dictation was prepared with Dragon dictation along with smaller phrase technology. Any transcriptional errors that result from this process are unintentional.

## 2017-05-20 ENCOUNTER — Telehealth: Payer: Self-pay

## 2017-05-20 NOTE — Telephone Encounter (Signed)
Transition Care Management Follow-up Telephone Call   Date discharged? 05/19/17   How have you been since you were released from the hospital? [place]. NO HYPOXIA, CHEST PAIN.    Do you understand why you were in the hospital? [place], ACUTE CHF EXACERBATION.   Do you understand the discharge instructions? YES, FOLLOW UP WITH PCP, HEART FAILURE CLINIC. APPOINTMENTS SCHEDULED.   Where were you discharged to? HOME   Items Reviewed:  Medications reviewed: YES, TAKING SCHEDULED MEDICATIONS WITHOUT ISSUES  Allergies reviewed: YES, PREDNISONE and FLEXERIL  Dietary changes reviewed: YES, LOW SODIUM DIET  Referrals reviewed: [facility] 06/02/17   Functional Questionnaire:   Activities of Daily Living (ADLs):   She states they are independent in the following: independent in all ADLs States they require assistance with the following: No assistance required.   Any transportation issues/concerns?: NO   Any patient concerns? NO   Confirmed importance and date/time of follow-up visits scheduled YES, SCHEDULED 05/25/17 at 8:00. Provider Appointment booked with Dr. Nicki Reaper, PCP.  Confirmed with patient if condition begins to worsen call PCP or go to the ER.  Patient was given the office number and encouraged to call back with question or concerns.  : YES

## 2017-05-25 ENCOUNTER — Ambulatory Visit (INDEPENDENT_AMBULATORY_CARE_PROVIDER_SITE_OTHER): Payer: Medicare Other | Admitting: Internal Medicine

## 2017-05-25 ENCOUNTER — Encounter: Payer: Self-pay | Admitting: Internal Medicine

## 2017-05-25 ENCOUNTER — Telehealth: Payer: Self-pay

## 2017-05-25 DIAGNOSIS — I6523 Occlusion and stenosis of bilateral carotid arteries: Secondary | ICD-10-CM

## 2017-05-25 DIAGNOSIS — I70213 Atherosclerosis of native arteries of extremities with intermittent claudication, bilateral legs: Secondary | ICD-10-CM | POA: Diagnosis not present

## 2017-05-25 DIAGNOSIS — E7800 Pure hypercholesterolemia, unspecified: Secondary | ICD-10-CM | POA: Diagnosis not present

## 2017-05-25 DIAGNOSIS — I509 Heart failure, unspecified: Secondary | ICD-10-CM | POA: Diagnosis not present

## 2017-05-25 DIAGNOSIS — I10 Essential (primary) hypertension: Secondary | ICD-10-CM

## 2017-05-25 DIAGNOSIS — I2510 Atherosclerotic heart disease of native coronary artery without angina pectoris: Secondary | ICD-10-CM

## 2017-05-25 LAB — BASIC METABOLIC PANEL
BUN: 13 mg/dL (ref 6–23)
CALCIUM: 9.7 mg/dL (ref 8.4–10.5)
CO2: 30 mEq/L (ref 19–32)
Chloride: 100 mEq/L (ref 96–112)
Creatinine, Ser: 0.89 mg/dL (ref 0.40–1.20)
GFR: 67.29 mL/min (ref >60.00)
Glucose, Bld: 106 mg/dL — ABNORMAL HIGH (ref 70–99)
Potassium: 4.5 mEq/L (ref 3.5–5.1)
SODIUM: 137 meq/L (ref 135–145)

## 2017-05-25 LAB — TSH: TSH: 1.55 u[IU]/mL (ref 0.35–4.50)

## 2017-05-25 LAB — LIPID PANEL
Cholesterol: 256 mg/dL — ABNORMAL HIGH (ref 0–200)
HDL: 45.3 mg/dL (ref >39.00)
NonHDL: 210.84
Total CHOL/HDL Ratio: 6
Triglycerides: 320 mg/dL — ABNORMAL HIGH (ref 0.0–149.0)
VLDL: 64 mg/dL — ABNORMAL HIGH (ref 0.0–40.0)

## 2017-05-25 LAB — LDL CHOLESTEROL, DIRECT: Direct LDL: 161 mg/dL

## 2017-05-25 MED ORDER — ALPRAZOLAM 0.5 MG PO TABS: 0.5000 mg | ORAL_TABLET | Freq: Every day | ORAL | 0 refills | Status: DC | PRN

## 2017-05-25 NOTE — Telephone Encounter (Signed)
Flagged on EMMI report for having other questions/problems.  Called and spoke with patient.  She mentioned she saw her doctor this morning and that they addressed her questions. She has no other issues or concerns currenlty.  I thanked her for her time.

## 2017-05-25 NOTE — Progress Notes (Signed)
[name], female   DOB: 09-May-1950, 67 y.o.   MRN: [id number]   Subjective:    [name], female    DOB: 12-01-1950, 67 y.o.   MRN: [id number]  HPI  Patient here for hospital follow up.  She was admitted 05/17/17 and diagnosed with acute on chronic CHF with pulmonary edema.  Was treated - CHF protocol.  EF 40-45%.  Oxygen weaned off.  Recommended continuing coreg, plavix, losartan and imdur.  States since her discharge she has been doing well.  Breathing better.  No chest pain.  No acid reflux.  No abdominal pain.  Bowels moving.  Some allergy symptoms.  Taking claritin.  Clear mucus.  No significant sinus pressure.  No cough or congestion.     Past Medical History:  Diagnosis Date  . CAD (coronary artery disease) 2003   s/p CABG x 5 (Dr Evelina Dun), [place]- 2010  . Endometriosis   . Hypercholesteremia   . Hypertension   . PVD (peripheral vascular disease) ([place])    s/p intervention (left) - 2004, right (2006)   Past Surgical History:  Procedure Laterality Date  . ABDOMINAL HYSTERECTOMY    . BREAST SURGERY  2004   cyst removed  . cataract surgery  2007   left  . COLONOSCOPY WITH PROPOFOL N/A 10/30/2015   Procedure: COLONOSCOPY WITH PROPOFOL;  Surgeon: Robert Bellow, MD;  Location: [facility]COPY;  Service: Endoscopy;  Laterality: N/A;  . CORONARY ARTERY BYPASS GRAFT     2003   Family History  Problem Relation Age of Onset  . Heart disease Mother   . CVA Mother   . Heart disease Father        CABG  . Heart disease Unknown        aunts and uncles  . Breast cancer Neg Hx   . Colon cancer Neg Hx    Social History   Socioeconomic History  . Marital status: Married    Spouse name: Not on file  . Number of children: Not on file  . Years of education: Not on file  . Highest education level: Not on file  Occupational History  . Not on file  Social Needs  . Financial resource strain: Not on file  . Food insecurity:    Worry: Not on file   Inability: Not on file  . Transportation needs:    Medical: Not on file    Non-medical: Not on file  Tobacco Use  . Smoking status: Former Smoker    Last attempt to quit: 09/03/2008    Years since quitting: 8.7  . Smokeless tobacco: Never Used  Substance and Sexual Activity  . Alcohol use: Yes    Alcohol/week: 0.0 oz    Comment: once a year  . Drug use: No  . Sexual activity: Not on file  Lifestyle  . Physical activity:    Days per week: Not on file    Minutes per session: Not on file  . Stress: Not on file  Relationships  . Social connections:    Talks on phone: Not on file    Gets together: Not on file    Attends religious service: Not on file    Active member of club or organization: Not on file    Attends meetings of clubs or organizations: Not on file    Relationship status: Not on file  Other Topics Concern  . Not on file  Social History Narrative  . Not on  file    Outpatient Encounter Medications as of 05/25/2017  Medication Sig  . ALPRAZolam (XANAX) 0.5 MG tablet Take 1 tablet (0.5 mg total) by mouth daily as needed for sleep.  . Ascorbic Acid (VITAMIN C) 1000 MG tablet Take 1,000 mg by mouth 2 (two) times daily.   . carvedilol (COREG) 3.125 MG tablet Take 1 tablet (3.125 mg total) by mouth 2 (two) times daily with a meal.  . Cholecalciferol ([employer] D3) 2000 units CAPS Take 2,000 Units by mouth 2 (two) times daily.  . clopidogrel (PLAVIX) 75 MG tablet Take 1 tablet (75 mg total) by mouth daily.  . Coenzyme Q10 (CO Q 10) 100 MG CAPS Take 100 mg by mouth daily.  . Cromolyn Sodium (NASAL ALLERGY NA) Place 1 spray into the nose daily. X Clear   . diphenhydrAMINE (BENADRYL) 25 MG tablet Take 1 tablet (25 mg total) by mouth every 6 (six) hours.  . furosemide (LASIX) 40 MG tablet Take 40 mg by mouth daily.   . Garlic 7425 MG CAPS Take 1,000 mg by mouth daily.  . isosorbide mononitrate (IMDUR) 60 MG 24 hr tablet Take 60-120 mg by mouth 2 (two) times daily. Take 120 mg by mouth  in the morning and 60 mg by mouth in the evening.  Marland Kitchen KRILL OIL PO Take 2,000 mg by mouth daily.   Marland Kitchen losartan (COZAAR) 25 MG tablet Take 25 mg by mouth daily.   . Magnesium 250 MG TABS Take 500 mg by mouth at bedtime.   . nitroGLYCERIN (NITROSTAT) 0.4 MG SL tablet Place 1 tablet (0.4 mg total) under the tongue every 5 (five) minutes as needed for chest pain.  . OIL OF OREGANO PO Take 1,920 mg by mouth daily.  . rosuvastatin (CRESTOR) 5 MG tablet Take 1 tablet (5 mg total) by mouth daily. (Patient not taking: Reported on 05/17/2017)  . TURMERIC PO Take 2 tablets by mouth daily.   . vitamin E (VITAMIN E) 400 UNIT capsule Take 400 Units by mouth every Monday, Wednesday, and Friday.   . [DISCONTINUED] ALPRAZolam (XANAX) 0.5 MG tablet Take 1 tablet (0.5 mg total) by mouth daily as needed for sleep. (Patient taking differently: Take 0.25 mg by mouth at bedtime. )   No facility-administered encounter medications on file as of 05/25/2017.     Review of Systems  Constitutional: Negative for appetite change and unexpected weight change.  HENT: Positive for congestion and postnasal drip. Negative for sinus pressure.   Respiratory: Negative for cough and chest tightness.        Breathing improved.   Cardiovascular: Negative for chest pain, palpitations and leg swelling.  Gastrointestinal: Negative for abdominal pain, diarrhea, nausea and vomiting.  Genitourinary: Negative for difficulty urinating and dysuria.  Musculoskeletal: Negative for joint swelling and myalgias.  Skin: Negative for color change and rash.  Neurological: Negative for dizziness, light-headedness and headaches.  Psychiatric/Behavioral: Negative for agitation and dysphoric mood.       Objective:     Blood pressure rechecked by me:  122/80  Physical Exam  Constitutional: She appears well-developed and well-nourished. No distress.  HENT:  Nose: Nose normal.  Mouth/Throat: Oropharynx is clear and moist.  Neck: Neck supple. No  thyromegaly present.  Cardiovascular: Normal rate and regular rhythm.  Pulmonary/Chest: Breath sounds normal. No respiratory distress. She has no wheezes.  Abdominal: Soft. Bowel sounds are normal. There is no tenderness.  Musculoskeletal: She exhibits no edema or tenderness.  Lymphadenopathy:    She has no  cervical adenopathy.  Skin: No rash noted. No erythema.  Psychiatric: She has a normal mood and affect. Her behavior is normal.    BP 108/72 (BP Location: Left Arm, Patient Position: Sitting, Cuff Size: Normal)   Pulse 83   Temp 98.3 F (36.8 C) (Oral)   Resp 18   Wt 163 lb 6.4 oz (74.1 kg)   SpO2 95%   BMI 25.59 kg/m  Wt Readings from Last 3 Encounters:  05/25/17 163 lb 6.4 oz (74.1 kg)  05/18/17 157 lb 14.4 oz (71.6 kg)  01/01/17 159 lb 12.8 oz (72.5 kg)     Lab Results  Component Value Date   WBC 6.0 05/18/2017   HGB 13.7 05/18/2017   HCT 40.0 05/18/2017   PLT 213 05/18/2017   GLUCOSE 106 (H) 05/25/2017   CHOL 256 (H) 05/25/2017   TRIG 320.0 (H) 05/25/2017   HDL 45.30 05/25/2017   LDLDIRECT 161.0 05/25/2017   LDLCALC 157 07/24/2015   ALT 21 05/17/2017   AST 28 05/17/2017   NA 137 05/25/2017   K 4.5 05/25/2017   CL 100 05/25/2017   CREATININE 0.89 05/25/2017   BUN 13 05/25/2017   CO2 30 05/25/2017   TSH 1.55 05/25/2017   INR 1.00 05/18/2017   HGBA1C 5.8 11/24/2016       Assessment & Plan:   Problem List Items Addressed This Visit    Atherosclerosis of native arteries of extremity with intermittent claudication ([place])    Followed by Dr Lucky Cowboy.        Relevant Orders   Ambulatory referral to Cardiology   CAD (coronary artery disease)    H/o CABG.  Followed by cardiology.  Recently admitted for CHF.  Doing well since discharge.  Continue current medication regimen.  Have her f/u with her cardiologist.        Relevant Orders   Lipid panel (Completed)   Ambulatory referral to Cardiology   Carotid stenosis    Last evaluated 12/2016 - 60-70% stenosis  bilaterally.  Some progression.  Recommended continuing plavix and crestor.  Recommended 6 month f/u.        CHF (congestive heart failure) ([place])    Recently admitted with CHF.  Diuresed.  Continue current medication regimen.  F/u with cardiology.       Relevant Orders   Basic metabolic panel (Completed)   Ambulatory referral to Cardiology   Hypercholesteremia    Discussed importance of treating her cholesterol medication.  She declines cholesterol medication.       Hypertension    Blood pressure under good control.  Continue same medication regimen.  Follow pressures.  Follow metabolic panel.        Relevant Orders   TSH (Completed)       Einar Pheasant, MD

## 2017-05-26 ENCOUNTER — Other Ambulatory Visit: Payer: Self-pay | Admitting: Internal Medicine

## 2017-05-26 DIAGNOSIS — R739 Hyperglycemia, unspecified: Secondary | ICD-10-CM

## 2017-05-26 NOTE — Progress Notes (Signed)
Orders placed for f/u labs.  

## 2017-05-29 NOTE — Assessment & Plan Note (Signed)
Recently admitted with CHF.  Diuresed.  Continue current medication regimen.  F/u with cardiology.

## 2017-05-29 NOTE — Assessment & Plan Note (Signed)
Blood pressure under good control.  Continue same medication regimen.  Follow pressures.  Follow metabolic panel.   

## 2017-05-29 NOTE — Assessment & Plan Note (Signed)
H/o CABG.  Followed by cardiology.  Recently admitted for CHF.  Doing well since discharge.  Continue current medication regimen.  Have her f/u with her cardiologist.

## 2017-05-29 NOTE — Assessment & Plan Note (Signed)
Last evaluated 12/2016 - 60-70% stenosis bilaterally.  Some progression.  Recommended continuing plavix and crestor.  Recommended 6 month f/u.

## 2017-05-29 NOTE — Assessment & Plan Note (Signed)
Followed by Dr Dew.   

## 2017-05-29 NOTE — Assessment & Plan Note (Signed)
Discussed importance of treating her cholesterol medication.  She declines cholesterol medication.

## 2017-06-01 ENCOUNTER — Encounter (INDEPENDENT_AMBULATORY_CARE_PROVIDER_SITE_OTHER): Payer: Self-pay

## 2017-06-01 ENCOUNTER — Encounter: Payer: Self-pay | Admitting: Internal Medicine

## 2017-06-01 NOTE — Progress Notes (Deleted)
   Patient ID: SHELDA TRUBY, female    DOB: [birth year], 67 y.o.   MRN: [id number]  HPI  Ms Connery is a 67 y/o female with a history of  Echo report from 05/18/17 reviewed and showed an EF of 40-45% along with mild MR.   Admitted 05/17/17 due to acute HF exacerbation most likely due to dietary indiscretion. Cardiology consult obtained. Initially needed oxygen but was weaned off of it. Elevated troponin thought to be due to demand ischemia. Discharged after 2 days.   She presents today for her initial visit with a chief complaint of   Review of Systems    Physical Exam    Assessment & Plan:  1: Chronic heart failure with reduced ejection fraction- - NYHA class - had not previously followed with cardiology in many years - BNP 05/17/17 was 586.0  2: HTN- - saw PCP Nicki Reaper) 05/25/17 - BMP on 05/25/17 reviewed and showed a sodium 137, potassium 4.5 and GFR 67.29  3: CAD- - CABG in 2010

## 2017-06-02 ENCOUNTER — Ambulatory Visit: Payer: Medicare Other | Admitting: Family

## 2017-06-30 ENCOUNTER — Other Ambulatory Visit (INDEPENDENT_AMBULATORY_CARE_PROVIDER_SITE_OTHER): Payer: Medicare Other

## 2017-06-30 DIAGNOSIS — R739 Hyperglycemia, unspecified: Secondary | ICD-10-CM | POA: Diagnosis not present

## 2017-06-30 LAB — HEMOGLOBIN A1C: HEMOGLOBIN A1C: 5.8 % (ref 4.6–6.5)

## 2017-07-01 ENCOUNTER — Encounter: Payer: Self-pay | Admitting: Internal Medicine

## 2017-07-01 LAB — GLUCOSE, FASTING: GLUCOSE, PLASMA: 107 mg/dL — AB (ref 65–99)

## 2017-07-02 ENCOUNTER — Encounter (INDEPENDENT_AMBULATORY_CARE_PROVIDER_SITE_OTHER): Payer: Medicare Other

## 2017-07-02 ENCOUNTER — Ambulatory Visit (INDEPENDENT_AMBULATORY_CARE_PROVIDER_SITE_OTHER): Payer: Medicare Other | Admitting: Vascular Surgery

## 2017-07-14 ENCOUNTER — Encounter: Payer: Self-pay | Admitting: Emergency Medicine

## 2017-07-14 ENCOUNTER — Emergency Department: Payer: Medicare Other

## 2017-07-14 ENCOUNTER — Inpatient Hospital Stay
Admission: EM | Admit: 2017-07-14 | Discharge: 2017-07-15 | DRG: 291 | Disposition: A | Discharge status: Home / Self Care | Payer: Medicare Other | Attending: Internal Medicine | Admitting: Internal Medicine

## 2017-07-14 ENCOUNTER — Other Ambulatory Visit: Payer: Self-pay

## 2017-07-14 DIAGNOSIS — I509 Heart failure, unspecified: Secondary | ICD-10-CM

## 2017-07-14 DIAGNOSIS — I110 Hypertensive heart disease with heart failure: Principal | ICD-10-CM | POA: Diagnosis present

## 2017-07-14 DIAGNOSIS — Z951 Presence of aortocoronary bypass graft: Secondary | ICD-10-CM

## 2017-07-14 DIAGNOSIS — I5023 Acute on chronic systolic (congestive) heart failure: Secondary | ICD-10-CM | POA: Diagnosis present

## 2017-07-14 DIAGNOSIS — Z87891 Personal history of nicotine dependence: Secondary | ICD-10-CM | POA: Diagnosis not present

## 2017-07-14 DIAGNOSIS — I429 Cardiomyopathy, unspecified: Secondary | ICD-10-CM | POA: Diagnosis present

## 2017-07-14 DIAGNOSIS — R0602 Shortness of breath: Secondary | ICD-10-CM | POA: Diagnosis not present

## 2017-07-14 DIAGNOSIS — E871 Hypo-osmolality and hyponatremia: Secondary | ICD-10-CM | POA: Diagnosis present

## 2017-07-14 DIAGNOSIS — Z888 Allergy status to other drugs, medicaments and biological substances status: Secondary | ICD-10-CM

## 2017-07-14 DIAGNOSIS — Z9842 Cataract extraction status, left eye: Secondary | ICD-10-CM

## 2017-07-14 DIAGNOSIS — Z8249 Family history of ischemic heart disease and other diseases of the circulatory system: Secondary | ICD-10-CM | POA: Diagnosis not present

## 2017-07-14 DIAGNOSIS — I739 Peripheral vascular disease, unspecified: Secondary | ICD-10-CM | POA: Diagnosis present

## 2017-07-14 DIAGNOSIS — Z79899 Other long term (current) drug therapy: Secondary | ICD-10-CM | POA: Diagnosis not present

## 2017-07-14 DIAGNOSIS — Z823 Family history of stroke: Secondary | ICD-10-CM

## 2017-07-14 DIAGNOSIS — I2510 Atherosclerotic heart disease of native coronary artery without angina pectoris: Secondary | ICD-10-CM | POA: Diagnosis not present

## 2017-07-14 DIAGNOSIS — E785 Hyperlipidemia, unspecified: Secondary | ICD-10-CM | POA: Diagnosis present

## 2017-07-14 DIAGNOSIS — I952 Hypotension due to drugs: Secondary | ICD-10-CM | POA: Diagnosis not present

## 2017-07-14 DIAGNOSIS — J9601 Acute respiratory failure with hypoxia: Secondary | ICD-10-CM | POA: Diagnosis present

## 2017-07-14 DIAGNOSIS — J810 Acute pulmonary edema: Secondary | ICD-10-CM | POA: Diagnosis not present

## 2017-07-14 DIAGNOSIS — Z7902 Long term (current) use of antithrombotics/antiplatelets: Secondary | ICD-10-CM

## 2017-07-14 DIAGNOSIS — Y92230 Patient room in hospital as the place of occurrence of the external cause: Secondary | ICD-10-CM | POA: Diagnosis not present

## 2017-07-14 DIAGNOSIS — E7800 Pure hypercholesterolemia, unspecified: Secondary | ICD-10-CM | POA: Diagnosis present

## 2017-07-14 DIAGNOSIS — T465X5A Adverse effect of other antihypertensive drugs, initial encounter: Secondary | ICD-10-CM | POA: Diagnosis not present

## 2017-07-14 DIAGNOSIS — I447 Left bundle-branch block, unspecified: Secondary | ICD-10-CM | POA: Diagnosis present

## 2017-07-14 DIAGNOSIS — Z90710 Acquired absence of both cervix and uterus: Secondary | ICD-10-CM | POA: Diagnosis not present

## 2017-07-14 DIAGNOSIS — I501 Left ventricular failure: Secondary | ICD-10-CM

## 2017-07-14 DIAGNOSIS — N809 Endometriosis, unspecified: Secondary | ICD-10-CM | POA: Diagnosis present

## 2017-07-14 DIAGNOSIS — I10 Essential (primary) hypertension: Secondary | ICD-10-CM | POA: Diagnosis not present

## 2017-07-14 HISTORY — DX: Unspecified systolic (congestive) heart failure: I50.20

## 2017-07-14 LAB — BASIC METABOLIC PANEL
Anion gap: 16 — ABNORMAL HIGH (ref 5–15)
BUN: 10 mg/dL (ref 6–20)
CALCIUM: 8.6 mg/dL — AB (ref 8.9–10.3)
CO2: 16 mmol/L — AB (ref 22–32)
Chloride: 100 mmol/L — ABNORMAL LOW (ref 101–111)
Creatinine, Ser: 1.06 mg/dL — ABNORMAL HIGH (ref 0.44–1.00)
GFR calc Af Amer: >60 mL/min (ref >60)
GFR calc non Af Amer: 53 mL/min — ABNORMAL LOW (ref >60)
GLUCOSE: 253 mg/dL — AB (ref 65–99)
Potassium: 4.3 mmol/L (ref 3.5–5.1)
Sodium: 132 mmol/L — ABNORMAL LOW (ref 135–145)

## 2017-07-14 LAB — CBC WITH DIFFERENTIAL/PLATELET
Basophils Absolute: 0.1 10*3/uL (ref 0–0.1)
Basophils Relative: 1 %
EOS ABS: 0.2 10*3/uL (ref 0–0.7)
Eosinophils Relative: 2 %
HEMATOCRIT: 46.5 % (ref 35.0–47.0)
HEMOGLOBIN: 15.7 g/dL (ref 12.0–16.0)
LYMPHS ABS: 4 10*3/uL — AB (ref 1.0–3.6)
Lymphocytes Relative: 43 %
MCH: 31.6 pg (ref 26.0–34.0)
MCHC: 33.9 g/dL (ref 32.0–36.0)
MCV: 93.3 fL (ref 80.0–100.0)
MONOS PCT: 6 %
Monocytes Absolute: 0.6 10*3/uL (ref 0.2–0.9)
NEUTROS ABS: 4.6 10*3/uL (ref 1.4–6.5)
NEUTROS PCT: 48 %
Platelets: 303 10*3/uL (ref 150–440)
RBC: 4.98 MIL/uL (ref 3.80–5.20)
RDW: 14.9 % — ABNORMAL HIGH (ref 11.5–14.5)
WBC: 9.5 10*3/uL (ref 3.6–11.0)

## 2017-07-14 LAB — TROPONIN I: Troponin I: <0.03 ng/mL (ref <0.03)

## 2017-07-14 MED ORDER — ENOXAPARIN SODIUM 40 MG/0.4ML ~~LOC~~ SOLN
40.0000 mg | SUBCUTANEOUS | Status: DC
  Administered 2017-07-14: 40 mg via SUBCUTANEOUS
  Filled 2017-07-14: qty 0.4

## 2017-07-14 MED ORDER — ONDANSETRON HCL 4 MG PO TABS: 4.0000 mg | ORAL_TABLET | Freq: Four times a day (QID) | ORAL | Status: DC | PRN

## 2017-07-14 MED ORDER — ALPRAZOLAM 0.5 MG PO TABS
0.5000 mg | ORAL_TABLET | Freq: Every day | ORAL | Status: DC | PRN
  Administered 2017-07-14: 0.5 mg via ORAL
  Filled 2017-07-14: qty 1

## 2017-07-14 MED ORDER — CLOPIDOGREL BISULFATE 75 MG PO TABS
75.0000 mg | ORAL_TABLET | Freq: Every day | ORAL | Status: DC
  Administered 2017-07-15: 75 mg via ORAL
  Filled 2017-07-14: qty 1

## 2017-07-14 MED ORDER — FUROSEMIDE 10 MG/ML IJ SOLN
40.0000 mg | Freq: Once | INTRAMUSCULAR | Status: AC
  Administered 2017-07-14: 40 mg via INTRAVENOUS
  Filled 2017-07-14: qty 4

## 2017-07-14 MED ORDER — ENALAPRILAT 1.25 MG/ML IV SOLN
1.2500 mg | Freq: Once | INTRAVENOUS | Status: AC
  Administered 2017-07-14: 1.25 mg via INTRAVENOUS
  Filled 2017-07-14: qty 2

## 2017-07-14 MED ORDER — FUROSEMIDE 10 MG/ML IJ SOLN
40.0000 mg | Freq: Two times a day (BID) | INTRAMUSCULAR | Status: DC
  Administered 2017-07-14 – 2017-07-15 (×2): 40 mg via INTRAVENOUS
  Filled 2017-07-14 (×2): qty 4

## 2017-07-14 MED ORDER — ACETAMINOPHEN 650 MG RE SUPP: 650.0000 mg | Freq: Four times a day (QID) | RECTAL | Status: DC | PRN

## 2017-07-14 MED ORDER — NITROGLYCERIN 2 % TD OINT
1.0000 [in_us] | TOPICAL_OINTMENT | TRANSDERMAL | Status: AC
  Administered 2017-07-14: 1 [in_us] via TOPICAL
  Filled 2017-07-14: qty 1

## 2017-07-14 MED ORDER — ACETAMINOPHEN 325 MG PO TABS
650.0000 mg | ORAL_TABLET | Freq: Four times a day (QID) | ORAL | Status: DC | PRN
  Administered 2017-07-15: 650 mg via ORAL
  Filled 2017-07-14: qty 2

## 2017-07-14 MED ORDER — VITAMIN E 180 MG (400 UNIT) PO CAPS
400.0000 [IU] | ORAL_CAPSULE | ORAL | Status: DC
  Filled 2017-07-14: qty 1

## 2017-07-14 MED ORDER — CARVEDILOL 3.125 MG PO TABS
3.1250 mg | ORAL_TABLET | Freq: Two times a day (BID) | ORAL | Status: DC
  Administered 2017-07-14 – 2017-07-15 (×2): 3.125 mg via ORAL
  Filled 2017-07-14 (×2): qty 1

## 2017-07-14 MED ORDER — ONDANSETRON HCL 4 MG/2ML IJ SOLN
4.0000 mg | Freq: Four times a day (QID) | INTRAMUSCULAR | Status: DC | PRN
Start: 2017-07-14 — End: 2017-07-15

## 2017-07-14 NOTE — ED Triage Notes (Signed)
Pt was at the gym and was getting ready to do her class but became SOB. Denies any Chest pain. EMS gave 4 baby Aspirin and 1 spray of Nitro. They could only get her O2 to 90 % and that was on CPAP.

## 2017-07-14 NOTE — H&P (Signed)
[place] [facility] [place] NAME: Jennifer [name]    MR#:  [id number]  DATE OF BIRTH:  [birth year]  DATE OF ADMISSION:  07/14/2017  PRIMARY CARE PHYSICIAN: Einar Pheasant, MD   REQUESTING/REFERRING PHYSICIAN: Dr. Carrie Mew  CHIEF COMPLAINT:   Chief Complaint  Patient presents with  . Shortness of Breath    HISTORY OF PRESENT ILLNESS:  Jennifer [name]  is a 67 y.o. female with a known history of CAD status post CABG, systolic CHF, hypertension, peripheral vascular disease presents to hospital secondary to worsening shortness of breath. Patient is active and independent at baseline.  She went to the gym today to start an exercise class, experienced sudden onset of shortness of breath, unable to catch her breathing and so was brought to the hospital.  She denies eating any increased salt or drinking plenty of fluids lately.  For several months she has had dyspnea on exertion, weight gain of about 8 pounds in the last month.  No nausea or vomiting, no fevers or chills.  Complains of lightheadedness during this episode.  No chest pain.  Due to her tachypnea, she was started on BiPAP in the emergency room.  Her saturations were within normal limits.  Blood pressure was elevated initially but with the Nitropaste and enalapril and Lasix IV, blood pressure dropped here.  She is otherwise stable. Chest x-ray confirms pulmonary edema  PAST MEDICAL HISTORY:   Past Medical History:  Diagnosis Date  . CAD (coronary artery disease) 2003   s/p CABG x 5 (Dr Evelina Dun), [place]- 2010  . Endometriosis   . Hypercholesteremia   . Hypertension   . PVD (peripheral vascular disease) ([place])    s/p intervention (left) - 2004, right (2006)  . Systolic CHF ([place])    last EF 40%    PAST SURGICAL HISTORY:   Past Surgical History:  Procedure Laterality Date  . ABDOMINAL HYSTERECTOMY    . BREAST SURGERY  2004   cyst removed  . cataract surgery  2007   left  . COLONOSCOPY  WITH PROPOFOL N/A 10/30/2015   Procedure: COLONOSCOPY WITH PROPOFOL;  Surgeon: Robert Bellow, MD;  Location: [facility] ENDOSCOPY;  Service: Endoscopy;  Laterality: N/A;  . CORONARY ARTERY BYPASS GRAFT     2003    SOCIAL HISTORY:   Social History   Tobacco Use  . Smoking status: Former Smoker    Last attempt to quit: 09/03/2008    Years since quitting: 8.8  . Smokeless tobacco: Never Used  Substance Use Topics  . Alcohol use: Yes    Alcohol/week: 0.0 oz    Comment: once a year    FAMILY HISTORY:   Family History  Problem Relation Age of Onset  . Heart disease Mother   . CVA Mother   . Heart disease Father        CABG  . Heart disease Unknown        aunts and uncles  . Breast cancer Neg Hx   . Colon cancer Neg Hx     DRUG ALLERGIES:   Allergies  Allergen Reactions  . Cyclobenzaprine Other (See Comments)    Felt bad  . Prednisone Other (See Comments)    Felt bad    REVIEW OF SYSTEMS:   Review of Systems  Constitutional: Negative for chills, fever, malaise/fatigue and weight loss.  HENT: Negative for ear discharge, ear pain, hearing loss, nosebleeds and tinnitus.   Eyes: Negative for blurred vision, double vision  and photophobia.  Respiratory: Positive for shortness of breath. Negative for cough, hemoptysis and wheezing.   Cardiovascular: Negative for chest pain, palpitations, orthopnea and leg swelling.  Gastrointestinal: Negative for abdominal pain, constipation, diarrhea, heartburn, melena, nausea and vomiting.  Genitourinary: Negative for dysuria, frequency, hematuria and urgency.  Musculoskeletal: Negative for back pain, myalgias and neck pain.  Skin: Negative for rash.  Neurological: Positive for dizziness. Negative for tingling, tremors, sensory change, speech change, focal weakness and headaches.  Endo/Heme/Allergies: Does not bruise/bleed easily.  Psychiatric/Behavioral: Negative for depression.    MEDICATIONS AT HOME:   Prior to Admission medications    Medication Sig Start Date End Date Taking? Authorizing Provider  ALPRAZolam Duanne Moron) 0.5 MG tablet Take 1 tablet (0.5 mg total) by mouth daily as needed for sleep. 05/25/17  Yes Einar Pheasant, MD  Ascorbic Acid (VITAMIN C) 1000 MG tablet Take 1,000 mg by mouth 2 (two) times daily.    Yes [provider]  carvedilol (COREG) 3.125 MG tablet Take 1 tablet (3.125 mg total) by mouth 2 (two) times daily with a meal. 12/01/11  Yes Einar Pheasant, MD  Cholecalciferol (CVS D3) 2000 units CAPS Take 2,000 Units by mouth 2 (two) times daily.   Yes [provider]  clopidogrel (PLAVIX) 75 MG tablet Take 1 tablet (75 mg total) by mouth daily. 11/04/15  Yes Byrnett, Forest Gleason, MD  Coenzyme Q10 (CO Q 10) 100 MG CAPS Take 100 mg by mouth daily.   Yes [provider]  furosemide (LASIX) 40 MG tablet Take 40 mg by mouth daily.  12/01/11  Yes Einar Pheasant, MD  Garlic 5053 MG CAPS Take 1,000 mg by mouth daily.   Yes [provider]  isosorbide mononitrate (IMDUR) 60 MG 24 hr tablet Take 60-120 mg by mouth 2 (two) times daily. Take 120 mg by mouth in the morning and 60 mg by mouth in the evening.   Yes [provider]  KRILL OIL PO Take 2,000 mg by mouth daily.    Yes [provider]  losartan (COZAAR) 25 MG tablet Take 25 mg by mouth daily.    Yes [provider]  Magnesium 250 MG TABS Take 500 mg by mouth at bedtime.    Yes [provider]  TURMERIC PO Take 2 tablets by mouth daily.    Yes [provider]  vitamin E (VITAMIN E) 400 UNIT capsule Take 400 Units by mouth every Monday, Wednesday, and Friday.    Yes [provider]  Cromolyn Sodium (NASAL ALLERGY NA) Place 1 spray into the nose daily. X Clear     [provider]  diphenhydrAMINE (BENADRYL) 25 MG tablet Take 1 tablet (25 mg total) by mouth every 6 (six) hours. Patient not taking: Reported on 07/14/2017 06/09/14   Olen Cordial, NP  nitroGLYCERIN  (NITROSTAT) 0.4 MG SL tablet Place 1 tablet (0.4 mg total) under the tongue every 5 (five) minutes as needed for chest pain. 12/01/11   Einar Pheasant, MD  OIL OF OREGANO PO Take 1,920 mg by mouth daily as needed (SINUS).     [provider]  rosuvastatin (CRESTOR) 5 MG tablet Take 1 tablet (5 mg total) by mouth daily. Patient not taking: Reported on 05/17/2017 05/14/16   Einar Pheasant, MD      VITAL SIGNS:  Blood pressure (!) 78/64, pulse 80, temperature 97.9 F (36.6 C), temperature source Axillary, resp. rate 20, height 5\' 5"  (1.651 m), weight 68 kg (150 lb), SpO2 93 %.  PHYSICAL EXAMINATION:   Physical Exam  GENERAL:  67 y.o.-year-old patient lying in the bed with no acute distress.  EYES: Pupils equal, round, reactive to light and accommodation. No scleral icterus. Extraocular muscles intact.  HEENT: Head atraumatic, normocephalic. Oropharynx and nasopharynx clear.  NECK:  Supple, no jugular venous distention. No thyroid enlargement, no tenderness.  LUNGS: coarse breath sounds all over the lung fields, no wheezing,  rhonchi or crepitation. No use of accessory muscles of respiration.  CARDIOVASCULAR: S1, S2 normal. No murmurs, rubs, or gallops.  ABDOMEN: Soft, nontender, nondistended. Bowel sounds present. No organomegaly or mass.  EXTREMITIES: No pedal edema, cyanosis, or clubbing.  NEUROLOGIC: Cranial nerves II through XII are intact. Muscle strength 5/5 in all extremities. Sensation intact. Gait not checked.  PSYCHIATRIC: The patient is alert and oriented x 3.  SKIN: No obvious rash, lesion, or ulcer.   LABORATORY PANEL:   CBC Recent Labs  Lab 07/14/17 0917  WBC 9.5  HGB 15.7  HCT 46.5  PLT 303   ------------------------------------------------------------------------------------------------------------------  Chemistries  Recent Labs  Lab 07/14/17 0917  NA 132*  K 4.3  CL 100*  CO2 16*  GLUCOSE 253*  BUN 10  CREATININE 1.06*  CALCIUM 8.6*    ------------------------------------------------------------------------------------------------------------------  Cardiac Enzymes Recent Labs  Lab 07/14/17 0917  TROPONINI <0.03   ------------------------------------------------------------------------------------------------------------------  RADIOLOGY:  Dg Chest Portable 1 View  Result Date: 07/14/2017 CLINICAL DATA:  Shortness of breath EXAM: PORTABLE CHEST 1 VIEW COMPARISON:  May 17, 2017 FINDINGS: There remains interstitial pulmonary edema without consolidation. Heart is borderline enlarged with mild pulmonary venous hypertension. No adenopathy. Patient is status post coronary artery bypass grafting. No evident bone lesions. IMPRESSION: There is a degree of pulmonary vascular congestion with interstitial pulmonary edema. No consolidation. Appearance similar to prior study. Electronically Signed   By: Lowella Grip III M.D.   On: 07/14/2017 09:29    EKG:   Orders placed or performed during the hospital encounter of 07/14/17  . EKG 12-Lead  . EKG 12-Lead    IMPRESSION AND PLAN:   Keileigh Vahey  is a 67 y.o. female with a known history of CAD status post CABG, systolic CHF, hypertension, peripheral vascular disease presents to hospital secondary to worsening shortness of breath.  1.  Acute on chronic systolic CHF exacerbation-admit to telemetry -Last echocardiogram from April 2019 showing EF of 40 to 45% -Cardiology consulted.  Daily weights, strict input and output monitoring -Started on Lasix IV twice daily -Hold losartan due to low normal blood pressure today. -Acutely needing 4 L of oxygen, wean as tolerated.  Off BiPAP now  2.  CAD-status post CABG in 2003 and stent in 2010 -Stable at this time.  Continue Plavix, Coreg.  Hold losartan due to low blood pressure  3.  Peripheral arterial disease-continue Plavix  4.  DVT prophylaxis-Lovenox   All the records are reviewed and case discussed with ED  provider. Management plans discussed with the patient, family and they are in agreement.  CODE STATUS: Full Code  TOTAL TIME TAKING CARE OF THIS PATIENT: 50 minutes.    Gladstone Lighter M.D on 07/14/2017 at 11:24 AM  Between 7am to 6pm - Pager - [phone]  After 6pm go to [email or web address] - password [email or web address]  Office  [phone]  CC: Primary care physician; Einar Pheasant, MD

## 2017-07-14 NOTE — Consult Note (Signed)
Cardiology Consultation Note    [name], MRN: [id number], DOB/AGE: 02-27-1950 67 y.o. Admit date: 07/14/2017   Date of Consult: 07/14/2017 Primary Physician: Einar Pheasant, MD Primary Cardiologist: Dr. Alfonso Patten, [place] Chief Complaint: sob Reason for Consultation: Duanne Limerick Requesting MD: Dr. Tressia Miners  HPI: Jennifer [name] is a 67 y.o. female with history of  Cad s/p cabg x 5 at [facility] in 2010, history of pvd, hypertension and hyperlipidemia.She has a known cardiomyopathy with ef of 30-35% by echo done in our hospital in 2015.She is followed by a cardiologist in [place]  She developed relatively rapid increase in her sob howevere does not follow daily weights. . . She denies chest pain.  CXR revealed evidence of chf. She has improved with nasal cannula oxygen and diuresis. EKG revealed nsr with lbbb which was new during admission in 05/2017. Troponin has been normal thus far. CXR suggests possible pulmonary edema. She has improoved since admission after diuresis but is still sob. She reports compliance with her meds.     Past Medical History:  Diagnosis Date  . CAD (coronary artery disease) 2003   s/p CABG x 5 (Dr Evelina Dun), [place]- 2010  . Endometriosis   . Hypercholesteremia   . Hypertension   . PVD (peripheral vascular disease) ([place])    s/p intervention (left) - 2004, right (2006)  . Systolic CHF ([place])    last EF 40%      Surgical History:  Past Surgical History:  Procedure Laterality Date  . ABDOMINAL HYSTERECTOMY    . BREAST SURGERY  2004   cyst removed  . cataract surgery  2007   left  . COLONOSCOPY WITH PROPOFOL N/A 10/30/2015   Procedure: COLONOSCOPY WITH PROPOFOL;  Surgeon: Robert Bellow, MD;  [facility] ENDOSCOPY;  Service: Endoscopy;  Laterality: N/A;  . CORONARY ARTERY BYPASS GRAFT     2003     Home Meds: Prior to Admission medications   Medication Sig Start Date End Date Taking? Authorizing Provider  ALPRAZolam Duanne Moron) 0.5 MG tablet  Take 1 tablet (0.5 mg total) by mouth daily as needed for sleep. 05/25/17  Yes Einar Pheasant, MD  Ascorbic Acid (VITAMIN C) 1000 MG tablet Take 1,000 mg by mouth 2 (two) times daily.    Yes [provider]  carvedilol (COREG) 3.125 MG tablet Take 1 tablet (3.125 mg total) by mouth 2 (two) times daily with a meal. 12/01/11  Yes Einar Pheasant, MD  Cholecalciferol (CVS D3) 2000 units CAPS Take 2,000 Units by mouth 2 (two) times daily.   Yes [provider]  clopidogrel (PLAVIX) 75 MG tablet Take 1 tablet (75 mg total) by mouth daily. 11/04/15  Yes Byrnett, Forest Gleason, MD  Coenzyme Q10 (CO Q 10) 100 MG CAPS Take 100 mg by mouth daily.   Yes [provider]  furosemide (LASIX) 40 MG tablet Take 40 mg by mouth daily.  12/01/11  Yes Einar Pheasant, MD  Garlic 8756 MG CAPS Take 1,000 mg by mouth daily.   Yes [provider]  isosorbide mononitrate (IMDUR) 60 MG 24 hr tablet Take 60-120 mg by mouth 2 (two) times daily. Take 120 mg by mouth in the morning and 60 mg by mouth in the evening.   Yes [provider]  KRILL OIL PO Take 2,000 mg by mouth daily.    Yes [provider]  losartan (COZAAR) 25 MG tablet Take 25 mg by mouth daily.    Yes [provider]  Magnesium 250 MG TABS Take 500 mg by mouth at bedtime.    Yes [provider]  TURMERIC PO Take 2 tablets by mouth daily.    Yes [provider]  vitamin E (VITAMIN E) 400 UNIT capsule Take 400 Units by mouth every Monday, Wednesday, and Friday.    Yes [provider]  Cromolyn Sodium (NASAL ALLERGY NA) Place 1 spray into the nose daily. X Clear     [provider]  diphenhydrAMINE (BENADRYL) 25 MG tablet Take 1 tablet (25 mg total) by mouth every 6 (six) hours. Patient not taking: Reported on 07/14/2017 06/09/14   Olen Cordial, NP  nitroGLYCERIN (NITROSTAT) 0.4 MG SL tablet Place 1 tablet (0.4 mg total) under the tongue every 5 (five) minutes as needed  for chest pain. 12/01/11   Einar Pheasant, MD  OIL OF OREGANO PO Take 1,920 mg by mouth daily as needed (SINUS).     [provider]  rosuvastatin (CRESTOR) 5 MG tablet Take 1 tablet (5 mg total) by mouth daily. Patient not taking: Reported on 05/17/2017 05/14/16   Einar Pheasant, MD    Inpatient Medications:  . carvedilol  3.125 mg Oral BID WC  . [START ON 07/15/2017] clopidogrel  75 mg Oral Daily  . enoxaparin (LOVENOX) injection  40 mg Subcutaneous Q24H  . furosemide  40 mg Intravenous Q12H  . vitamin E  400 Units Oral Q M,W,F     Allergies:  Allergies  Allergen Reactions  . Cyclobenzaprine Other (See Comments)    Felt bad  . Prednisone Other (See Comments)    Felt bad    Social History   Socioeconomic History  . Marital status: Married    Spouse name: Not on file  . Number of children: Not on file  . Years of education: Not on file  . Highest education level: Not on file  Occupational History  . Not on file  Social Needs  . Financial resource strain: Not on file  . Food insecurity:    Worry: Not on file    Inability: Not on file  . Transportation needs:    Medical: Not on file    Non-medical: Not on file  Tobacco Use  . Smoking status: Former Smoker    Last attempt to quit: 09/03/2008    Years since quitting: 8.8  . Smokeless tobacco: Never Used  Substance and Sexual Activity  . Alcohol use: Yes    Alcohol/week: 0.0 oz    Comment: once a year  . Drug use: No  . Sexual activity: Not on file  Lifestyle  . Physical activity:    Days per week: Not on file    Minutes per session: Not on file  . Stress: Not on file  Relationships  . Social connections:    Talks on phone: Not on file    Gets together: Not on file    Attends religious service: Not on file    Active member of club or organization: Not on file    Attends meetings of clubs or organizations: Not on file    Relationship status: Not on file  . Intimate partner violence:    Fear of  current or ex partner: Not on file    Emotionally abused: Not on file    Physically abused: Not on file    Forced sexual activity: Not on file  Other Topics Concern  . Not on file  Social History Narrative   Lives at  home with husband, independent at baseline     Family History  Problem Relation Age of Onset  . Heart disease Mother   . CVA Mother   . Heart disease Father        CABG  . Heart disease Unknown        aunts and uncles  . Breast cancer Neg Hx   . Colon cancer Neg Hx      Review of Systems: A 12-system review of systems was performed and is negative except as noted in the HPI.  Labs: Recent Labs    07/14/17 0917  TROPONINI <0.03   Lab Results  Component Value Date   WBC 9.5 07/14/2017   HGB 15.7 07/14/2017   HCT 46.5 07/14/2017   MCV 93.3 07/14/2017   PLT 303 07/14/2017    Recent Labs  Lab 07/14/17 0917  NA 132*  K 4.3  CL 100*  CO2 16*  BUN 10  CREATININE 1.06*  CALCIUM 8.6*  GLUCOSE 253*   Lab Results  Component Value Date   CHOL 256 (H) 05/25/2017   HDL 45.30 05/25/2017   LDLCALC 157 07/24/2015   TRIG 320.0 (H) 05/25/2017   No results found for: DDIMER  Radiology/Studies:  Dg Chest Portable 1 View  Result Date: 07/14/2017 CLINICAL DATA:  Shortness of breath EXAM: PORTABLE CHEST 1 VIEW COMPARISON:  May 17, 2017 FINDINGS: There remains interstitial pulmonary edema without consolidation. Heart is borderline enlarged with mild pulmonary venous hypertension. No adenopathy. Patient is status post coronary artery bypass grafting. No evident bone lesions. IMPRESSION: There is a degree of pulmonary vascular congestion with interstitial pulmonary edema. No consolidation. Appearance similar to prior study. Electronically Signed   By: Lowella Grip III M.D.   On: 07/14/2017 09:29    Wt Readings from Last 3 Encounters:  07/14/17 74.3 kg (163 lb 12.8 oz)  05/25/17 74.1 kg (163 lb 6.4 oz)  05/18/17 71.6 kg (157 lb 14.4 oz)    EKG: st with  lbbb  Physical Exam:  Blood pressure 116/63, pulse 65, temperature (!) 97.5 F (36.4 C), temperature source Oral, resp. rate 20, height 5\' 5"  (1.651 m), weight 74.3 kg (163 lb 12.8 oz), SpO2 100 %. Body mass index is 27.26 kg/m. General: Well developed, well nourished, in no acute distress. Head: Normocephalic, atraumatic, sclera non-icteric, no xanthomas, nares are without discharge.  Neck: Negative for carotid bruits. JVD not elevated. Lungs: Crackles bilaterally Heart: RRR with S1 S2. No murmurs, rubs, or gallops appreciated. Abdomen: Soft, non-tender, non-distended with normoactive bowel sounds. No hepatomegaly. No rebound/guarding. No obvious abdominal masses. Msk:  Strength and tone appear normal for age. Extremities: No clg or cyanosis. No edema.  Distal pedal pulses are 2+ and equal bilaterally. Neuro: Alert and oriented X 3. No facial asymmetry. No focal deficit. Moves all extremities spontaneously. Psych:  Responds to questions appropriately with a normal affect.     Assessment and Plan  Pt is a female with history of mild to moderate cardiomyopathy with ef of 45-45% who was admitted after presenting to the er with complaints of sob. She states she is 10-12 pounds above her dry weight. She attempts to eat a lw sodium diet. She denies chest pain. CXR suggests pulmonary edema. Long discussion with patient and her husband regarding daily weights and low sodium diet. Will conitnue with durrent outpatint med and proceed with iv diuretics. Rule out for mi. Should be able to discharge after diuresiing about 10 pounds. Will follow with you.  Signed, Teodoro Spray MD 07/14/2017, 3:57 PM Pager: [phone]

## 2017-07-14 NOTE — Progress Notes (Signed)
Chaplain was paged to room, but patient and her family indicated that they did not request a visit.  Despite this, the patient welcomed the chaplain into the room.  She indicated that she was grateful for a visit and feeling much better than when she had arrived.  Chaplain offered prayer and the patient and her husband accepted.  Chaplain prayed for the patient and her family's strength in the days and weeks to follow.  Patient indicated that she was being admitted and awaiting transport to a room.  Chaplain agreed to check back in with the patient later.    07/14/17 1244  Clinical Encounter Type  Visited With Patient and family together  Visit Type Initial  Recommendations  (Visit patient and family once moved to room)  Spiritual Encounters  Spiritual Needs Prayer

## 2017-07-14 NOTE — ED Notes (Addendum)
Per Admitting MD pt ok to ween off BIPAP. Respiratory at bedside at this time. Pt placed on nasal cannula at 4L.

## 2017-07-14 NOTE — ED Provider Notes (Addendum)
[facility] Emergency Department Provider Note  ____________________________________________  Time seen: Approximately 10:20 AM  I have reviewed the triage vital signs and the nursing notes.   HISTORY  Chief Complaint Shortness of Breath    HPI Jennifer [name] is a 67 y.o. female with a history of CAD CHF hypertension who reports being compliant with all her medications and was in her usual state of health until this morning when she went to the gym for an exercise class.  Unfortunately she became very short of breath and had respiratory distress.  On arrival EMS noted that her room air oxygen saturation was about 85%.  She had significant work of breathing so they put her on CPAP.  This increased her oxygenation to 90%.  Patient denies any chest pain fevers chills sweats cough or other acute symptoms.  Shortness of breath was sudden, severe, constant without aggravating or alleviating factors.      Past Medical History:  Diagnosis Date  . CAD (coronary artery disease) 2003   s/p CABG x 5 (Dr Evelina Dun), [place]- 2010  . Endometriosis   . Hypercholesteremia   . Hypertension   . PVD (peripheral vascular disease) ([place])    s/p intervention (left) - 2004, right (2006)     Patient Active Problem List   Diagnosis Date Noted  . CHF (congestive heart failure) ([place]) 05/25/2017  . Pulmonary edema 05/17/2017  . Carotid stenosis 01/01/2017  . Atherosclerosis of native arteries of extremity with intermittent claudication ([place]) 06/19/2016  . Encounter for screening colonoscopy 09/21/2015  . Acute sinusitis 09/03/2015  . Carotid bruit 11/20/2014  . Health care maintenance 05/25/2014  . Plantar fasciitis 01/08/2013  . Peripheral vascular disease ([place]) 12/07/2011  . CAD (coronary artery disease) 12/01/2011  . Hypertension 12/01/2011  . Hypercholesteremia 12/01/2011     Past Surgical History:  Procedure Laterality Date  . ABDOMINAL HYSTERECTOMY    . BREAST SURGERY   2004   cyst removed  . cataract surgery  2007   left  . COLONOSCOPY WITH PROPOFOL N/A 10/30/2015   Procedure: COLONOSCOPY WITH PROPOFOL;  Surgeon: Robert Bellow, MD;  Location: [facility] ENDOSCOPY;  Service: Endoscopy;  Laterality: N/A;  . CORONARY ARTERY BYPASS GRAFT     2003     Prior to Admission medications   Medication Sig Start Date End Date Taking? Authorizing Provider  ALPRAZolam Duanne Moron) 0.5 MG tablet Take 1 tablet (0.5 mg total) by mouth daily as needed for sleep. 05/25/17  Yes Einar Pheasant, MD  Ascorbic Acid (VITAMIN C) 1000 MG tablet Take 1,000 mg by mouth 2 (two) times daily.    Yes [provider]  carvedilol (COREG) 3.125 MG tablet Take 1 tablet (3.125 mg total) by mouth 2 (two) times daily with a meal. 12/01/11  Yes Einar Pheasant, MD  Cholecalciferol (CVS D3) 2000 units CAPS Take 2,000 Units by mouth 2 (two) times daily.   Yes [provider]  clopidogrel (PLAVIX) 75 MG tablet Take 1 tablet (75 mg total) by mouth daily. 11/04/15  Yes Byrnett, Forest Gleason, MD  Coenzyme Q10 (CO Q 10) 100 MG CAPS Take 100 mg by mouth daily.   Yes [provider]  furosemide (LASIX) 40 MG tablet Take 40 mg by mouth daily.  12/01/11  Yes Einar Pheasant, MD  Garlic 1324 MG CAPS Take 1,000 mg by mouth daily.   Yes [provider]  isosorbide mononitrate (IMDUR) 60 MG 24 hr tablet Take 60-120 mg by mouth 2 (two) times daily.  Take 120 mg by mouth in the morning and 60 mg by mouth in the evening.   Yes [provider]  KRILL OIL PO Take 2,000 mg by mouth daily.    Yes [provider]  losartan (COZAAR) 25 MG tablet Take 25 mg by mouth daily.    Yes [provider]  Magnesium 250 MG TABS Take 500 mg by mouth at bedtime.    Yes [provider]  TURMERIC PO Take 2 tablets by mouth daily.    Yes [provider]  vitamin E (VITAMIN E) 400 UNIT capsule Take 400 Units by mouth every Monday, Wednesday, and Friday.    Yes [provider]  Cromolyn Sodium (NASAL ALLERGY NA) Place 1 spray into the nose daily. X Clear     [provider]  diphenhydrAMINE (BENADRYL) 25 MG tablet Take 1 tablet (25 mg total) by mouth every 6 (six) hours. Patient not taking: Reported on 07/14/2017 06/09/14   Olen Cordial, NP  nitroGLYCERIN (NITROSTAT) 0.4 MG SL tablet Place 1 tablet (0.4 mg total) under the tongue every 5 (five) minutes as needed for chest pain. 12/01/11   Einar Pheasant, MD  OIL OF OREGANO PO Take 1,920 mg by mouth daily as needed (SINUS).     [provider]  rosuvastatin (CRESTOR) 5 MG tablet Take 1 tablet (5 mg total) by mouth daily. Patient not taking: Reported on 05/17/2017 05/14/16   Einar Pheasant, MD     Allergies Cyclobenzaprine and Prednisone   Family History  Problem Relation Age of Onset  . Heart disease Mother   . CVA Mother   . Heart disease Father        CABG  . Heart disease Unknown        aunts and uncles  . Breast cancer Neg Hx   . Colon cancer Neg Hx     Social History Social History   Tobacco Use  . Smoking status: Former Smoker    Last attempt to quit: 09/03/2008    Years since quitting: 8.8  . Smokeless tobacco: Never Used  Substance Use Topics  . Alcohol use: Yes    Alcohol/week: 0.0 oz    Comment: once a year  . Drug use: No    Review of Systems  Constitutional:   No fever or chills.  ENT:   No sore throat. No rhinorrhea. Cardiovascular:   No chest pain or syncope. Respiratory: Positive as above shortness of breath without cough. Gastrointestinal:   Negative for abdominal pain, vomiting and diarrhea.  Musculoskeletal:   Negative for focal pain or swelling All other systems reviewed and are negative except as documented above in ROS and HPI.  ____________________________________________   PHYSICAL EXAM:  VITAL SIGNS: ED Triage Vitals  Enc Vitals Group     BP 07/14/17 0919 138/87     Pulse Rate 07/14/17 0915 (!) 109     Resp 07/14/17 0919  19     Temp 07/14/17 0919 97.9 F (36.6 C)     Temp Source 07/14/17 0919 Axillary     SpO2 07/14/17 0915 99 %     Weight 07/14/17 0921 150 lb (68 kg)     Height 07/14/17 0921 5\' 5"  (1.651 m)     Head Circumference --      Peak Flow --      Pain Score 07/14/17 0920 0     Pain Loc --      Pain Edu? --  Excl. in [place]? --     Vital signs reviewed, nursing assessments reviewed.   Constitutional:   Alert and oriented.  Ill-appearing. Eyes:   Conjunctivae are normal. EOMI. PERRL. ENT      Head:   Normocephalic and atraumatic.      Nose:   No congestion/rhinnorhea.       Mouth/Throat:   MMM, no pharyngeal erythema. No peritonsillar mass.       Neck:   No meningismus. Full ROM. Hematological/Lymphatic/Immunilogical:   No cervical lymphadenopathy. Cardiovascular:   RRR. Symmetric bilateral radial and DP pulses.  No murmurs.  Respiratory:   Increased work of breathing, accessory muscle use, tachypnea.  Diffuse inspiratory crackles. Gastrointestinal:   Soft and nontender. Non distended. There is no CVA tenderness.  No rebound, rigidity, or guarding.  Musculoskeletal:   Normal range of motion in all extremities. No joint effusions.  No lower extremity tenderness.  Trace peripheral edema. Neurologic:   Normal speech and language.  Motor grossly intact. No acute focal neurologic deficits are appreciated.  Skin:    Skin is warm, dry and intact. No rash noted.  No petechiae, purpura, or bullae.  ____________________________________________    LABS (pertinent positives/negatives) (all labs ordered are listed, but only abnormal results are displayed) Labs Reviewed  BASIC METABOLIC PANEL - Abnormal; Notable for the following components:      Result Value   Sodium 132 (*)    Chloride 100 (*)    CO2 16 (*)    Glucose, Bld 253 (*)    Creatinine, Ser 1.06 (*)    Calcium 8.6 (*)    GFR calc non Af Amer 53 (*)    Anion gap 16 (*)    All other components within normal limits  CBC WITH  DIFFERENTIAL/PLATELET - Abnormal; Notable for the following components:   RDW 14.9 (*)    Lymphs Abs 4.0 (*)    All other components within normal limits  TROPONIN I   ____________________________________________   EKG  Interpreted by me Sinus tachycardia rate 109, left axis, left bundle branch block.  No acute ischemic changes.  Unchanged from previous EKG May 20, 2017.  ____________________________________________    RADIOLOGY  Dg Chest Portable 1 View  Result Date: 07/14/2017 CLINICAL DATA:  Shortness of breath EXAM: PORTABLE CHEST 1 VIEW COMPARISON:  May 17, 2017 FINDINGS: There remains interstitial pulmonary edema without consolidation. Heart is borderline enlarged with mild pulmonary venous hypertension. No adenopathy. Patient is status post coronary artery bypass grafting. No evident bone lesions. IMPRESSION: There is a degree of pulmonary vascular congestion with interstitial pulmonary edema. No consolidation. Appearance similar to prior study. Electronically Signed   By: Lowella Grip III M.D.   On: 07/14/2017 09:29    ____________________________________________   PROCEDURES .Critical Care Performed by: Carrie Mew, MD Authorized by: Carrie Mew, MD   Critical care provider statement:    Critical care time (minutes):  40   Critical care time was exclusive of:  Separately billable procedures and treating other patients   Critical care was necessary to treat or prevent imminent or life-threatening deterioration of the following conditions:  Circulatory failure and respiratory failure   Critical care was time spent personally by me on the following activities:  Development of treatment plan with patient or surrogate, discussions with consultants, evaluation of patient's response to treatment, examination of patient, obtaining history from patient or surrogate, ordering and performing treatments and interventions, ordering and review of laboratory  studies, ordering and review of radiographic  studies, pulse oximetry, re-evaluation of patient's condition and review of old charts    ____________________________________________    CLINICAL IMPRESSION / ASSESSMENT AND PLAN / ED COURSE  Pertinent labs & imaging results that were available during my care of the patient were reviewed by me and considered in my medical decision making (see chart for details).      Clinical Course as of Jul 14 1049  Wed Jul 14, 2017  0919 Presents with respiratory distress, apparently pulmonary edema based on physical exam.  Will obtain portable chest x-ray, give IV Lasix 40 mg, IV enalapril 1.25 mg, nitroglycerin ointment to the chest.  BiPAP started immediately upon arrival for respiratory support.   [PS]  1019 Chest x-ray consistent with pulmonary edema, likely improved from her initial presentation due to the noninvasive positive pressure ventilation she has been receiving by EMS in the ED.  Will admit.  Labs are unremarkable.  EKG shows baseline left bundle branch block without acute ischemic changes.   [PS]  1035 Bp now 70/50. Nitroglycerin removed.    [PS]  1039 Bp 80/60. No dizziness or chest pain. Fio2 60%   [PS]  1050 BP stable, symptoms stable.  Continue to titrate FiO2 down, currently 45%.   [PS]    Clinical Course User Index [PS] Carrie Mew, MD     ----------------------------------------- 10:34 AM on 07/14/2017 -----------------------------------------  Case discussed with the hospitalist.  We will continue to wean BiPAP as able.  Currently at 80% FiO2.  Patient does feel improved with BiPAP and medication so far.  Blood pressure tolerating the nitroglycerin and enalapril.  Currently I doubt ACS PE dissection carditis pneumothorax or pericardial effusion.  ____________________________________________   FINAL CLINICAL IMPRESSION(S) / ED DIAGNOSES    Final diagnoses:  Acute respiratory failure with hypoxia ([place])   Pulmonary edema with congestive heart failure [facility])     ED Discharge Orders    None      Portions of this note were generated with dragon dictation software. Dictation errors may occur despite best attempts at proofreading.    Carrie Mew, MD 07/14/17 1035    Carrie Mew, MD 07/14/17 1051

## 2017-07-14 NOTE — ED Notes (Signed)
[facility] EDT transporting pt at this time to [id number]

## 2017-07-15 ENCOUNTER — Telehealth: Payer: Self-pay | Admitting: Internal Medicine

## 2017-07-15 DIAGNOSIS — Z951 Presence of aortocoronary bypass graft: Secondary | ICD-10-CM | POA: Diagnosis not present

## 2017-07-15 DIAGNOSIS — I10 Essential (primary) hypertension: Secondary | ICD-10-CM | POA: Diagnosis not present

## 2017-07-15 DIAGNOSIS — I501 Left ventricular failure: Secondary | ICD-10-CM | POA: Diagnosis not present

## 2017-07-15 DIAGNOSIS — I2510 Atherosclerotic heart disease of native coronary artery without angina pectoris: Secondary | ICD-10-CM | POA: Diagnosis not present

## 2017-07-15 DIAGNOSIS — J9601 Acute respiratory failure with hypoxia: Secondary | ICD-10-CM | POA: Diagnosis not present

## 2017-07-15 DIAGNOSIS — I739 Peripheral vascular disease, unspecified: Secondary | ICD-10-CM | POA: Diagnosis not present

## 2017-07-15 DIAGNOSIS — I110 Hypertensive heart disease with heart failure: Secondary | ICD-10-CM | POA: Diagnosis not present

## 2017-07-15 DIAGNOSIS — I509 Heart failure, unspecified: Secondary | ICD-10-CM | POA: Diagnosis not present

## 2017-07-15 DIAGNOSIS — I5023 Acute on chronic systolic (congestive) heart failure: Secondary | ICD-10-CM | POA: Diagnosis not present

## 2017-07-15 DIAGNOSIS — E871 Hypo-osmolality and hyponatremia: Secondary | ICD-10-CM | POA: Diagnosis not present

## 2017-07-15 LAB — CBC
HCT: 37.2 % (ref 35.0–47.0)
HEMOGLOBIN: 12.8 g/dL (ref 12.0–16.0)
MCH: 31.3 pg (ref 26.0–34.0)
MCHC: 34.4 g/dL (ref 32.0–36.0)
MCV: 91.1 fL (ref 80.0–100.0)
Platelets: 210 10*3/uL (ref 150–440)
RBC: 4.08 MIL/uL (ref 3.80–5.20)
RDW: 14.9 % — ABNORMAL HIGH (ref 11.5–14.5)
WBC: 7.2 10*3/uL (ref 3.6–11.0)

## 2017-07-15 LAB — BASIC METABOLIC PANEL
ANION GAP: 8 (ref 5–15)
BUN: 14 mg/dL (ref 6–20)
CHLORIDE: 100 mmol/L — AB (ref 101–111)
CO2: 25 mmol/L (ref 22–32)
Calcium: 8.8 mg/dL — ABNORMAL LOW (ref 8.9–10.3)
Creatinine, Ser: 0.93 mg/dL (ref 0.44–1.00)
GFR calc non Af Amer: >60 mL/min (ref >60)
Glucose, Bld: 98 mg/dL (ref 65–99)
Potassium: 3.7 mmol/L (ref 3.5–5.1)
Sodium: 133 mmol/L — ABNORMAL LOW (ref 135–145)

## 2017-07-15 MED ORDER — FUROSEMIDE 40 MG PO TABS: 40.0000 mg | ORAL_TABLET | Freq: Every day | ORAL | Status: DC

## 2017-07-15 MED ORDER — FUROSEMIDE 10 MG/ML IJ SOLN: 60.0000 mg | Freq: Two times a day (BID) | INTRAMUSCULAR | Status: DC

## 2017-07-15 NOTE — Progress Notes (Addendum)
Cardiovascular and Pulmonary Nurse Navigator Note:   67 year old female with hx of CAD - s/p 5 vessel CABG in 2010, PVD, chronic systolic HF with EF of 40 to 45%, who presented to the ER with SOB.  Patient admitted with dx of acute on chronic heart failure.    EXAM: PORTABLE CHEST 1 VIEW  COMPARISON:  May 17, 2017  FINDINGS: There remains interstitial pulmonary edema without consolidation. Heart is borderline enlarged with mild pulmonary venous hypertension. No adenopathy. Patient is status post coronary artery bypass grafting. No evident bone lesions.  IMPRESSION: There is a degree of pulmonary vascular congestion with interstitial pulmonary edema. No consolidation. Appearance similar to prior study.  Active Problem List this admission: 1. Acute on chronic HF with EF 40 to 45% Patient being discharged today.  Patient instructed to check her weights daily and if she has a 3 pound weight gain or more she is to take an extra dose of Lasix and call her cardiologist.   2. Hx of CAD/CABG - patient is on Plavix and Coreg.   3. HTN - patient on losartan and coreg. 4. Hyponatremia from CHF.  Serum sodium 133 this a.m.    Patient will need repeat BMP in 3 to 5 days by PCP or cardiologist.    CHF Education:?? Educational session with patient completed.  Provided patient with "Living Better with Heart Failure" packet. Briefly reviewed definition of heart failure and signs and symptoms of an exacerbation.?Explained to patient that HF is a chronic illness which requires self-assessment / self-management along with help from the cardiologist/PCP.?? *Reviewed importance of and reason behind checking weight daily in the AM, after using the bathroom, but before getting dressed. Patient has scales, but has not been weighing herself.   ? *Reviewed with patient the following information: *Discussed when to call the Dr= weight gain of >2-3lb overnight of 5lb in a week,  *Discussed yellow zone=  call MD: weight gain of >2-3lb overnight of 5lb in a week, increased swelling, increased SOB when lying down, chest discomfort, dizziness, increased fatigue Patient informed this RN that she gains weight in her abdomen and she felt like it was getting bigger and bigger where she did not want anything to eat and caused increasing SOB.   *Red Zone= call 911: struggle to breath, fainting or near fainting, significant chest pain   *Reviewed low sodium diet-provided handout of recommended and not recommended foods.?Patient informed this RN she had two hot dogs the day before she was admitted.  Encouraged patient to read food labels and look at sodium content and serving sizes.  Discussed using seasonings other than salt.   ? *Discussed fluid intake with patient as well. Patient not currently on a fluid restriction, but advised no more than 8-8 ounces glass of fluids per day.? ? *Instructed patient to take medications as prescribed for heart failure. Explained briefly why pt is on the medications (either make you feel better, live longer or keep you out of the hospital) and discussed monitoring and side effects.  ? *Discussed exercise. Patient is a member of a local gym and she feels as though exercising there has helped her.   ? *Smoking Cessation- Patient is a former smoker.? ? *[facility] - Explained the purpose of the HF Clinic. ?Explained to patient the HF Clinic does not replace PCP nor Cardiologist, but is an additional resource to helping patient manage heart failure at home.  Appointment in the [facility] is August 06, 2017 at 10:40 a.m.    *[facility] Women Living with Heart Disease. Information provided. Patient invited and encouraged to attend. Brochure given.  Next meeting October 12, 2017 at 11:30 a.m.    Again, the 5 Steps to Living Better with Heart Failure were reviewed with patient.  ? Patient thanked me for providing the above  information. ? ? Roanna Epley, RN, BSN, [facility]  Cardiovascular &?Pulmonary Nurse Navigator  Direct Line: [phone]  Department Phone #: [phone] Fax: [phone]? Email Address: [email or web address]

## 2017-07-15 NOTE — Progress Notes (Signed)
Patient Name: Jennifer [name] Date of Encounter: 07/15/2017  Hospital Problem List     Active Problems:   CHF exacerbation [facility])    Patient Profile     67 yo female with history of cardiomyoopathy admitted with weight gain and evidence of volume overload. Has improved with diuresis. -600 since admission.   Subjective   Feels better. Wants to go home  Inpatient Medications    . carvedilol  3.125 mg Oral BID WC  . clopidogrel  75 mg Oral Daily  . enoxaparin (LOVENOX) injection  40 mg Subcutaneous Q24H  . furosemide  60 mg Intravenous Q12H  . vitamin E  400 Units Oral Q M,W,F    Vital Signs    Vitals:   07/15/17 0452 07/15/17 0637 07/15/17 0639 07/15/17 0738  BP: (!) 94/49 (!) 95/49 (!) 118/58 (!) 98/45  Pulse: 73 68 67 65  Resp:    18  Temp:    98.1 F (36.7 C)  TempSrc:    Oral  SpO2:    92%  Weight:      Height:        Intake/Output Summary (Last 24 hours) at 07/15/2017 1045 Last data filed at 07/15/2017 1010 Gross per 24 hour  Intake 360 ml  Output 600 ml  Net -240 ml   Filed Weights   07/14/17 0921 07/14/17 1313 07/15/17 0450  Weight: 68 kg (150 lb) 74.3 kg (163 lb 12.8 oz) 73 kg (161 lb)    Physical Exam    GEN: Well nourished, well developed, in no acute distress.  HEENT: normal.  Neck: Supple, no JVD, carotid bruits, or masses. Cardiac: RRR, no murmurs, rubs, or gallops. No clubbing, cyanosis, edema.  Radials/DP/PT 2+ and equal bilaterally.  Respiratory:  Respirations regular and unlabored, clear to auscultation bilaterally. GI: Soft, nontender, nondistended, BS + x 4. MS: no deformity or atrophy. Skin: warm and dry, no rash. Neuro:  Strength and sensation are intact. Psych: Normal affect.  Labs    CBC Recent Labs    07/14/17 0917 07/15/17 0511  WBC 9.5 7.2  NEUTROABS 4.6  --   HGB 15.7 12.8  HCT 46.5 37.2  MCV 93.3 91.1  PLT 303 341   Basic Metabolic Panel Recent Labs    07/14/17 0917 07/15/17 0511  NA 132* 133*  K 4.3 3.7   CL 100* 100*  CO2 16* 25  GLUCOSE 253* 98  BUN 10 14  CREATININE 1.06* 0.93  CALCIUM 8.6* 8.8*   Liver Function Tests No results for input(s): AST, ALT, ALKPHOS, BILITOT, PROT, ALBUMIN in the last 72 hours. No results for input(s): LIPASE, AMYLASE in the last 72 hours. Cardiac Enzymes Recent Labs    07/14/17 0917  TROPONINI <0.03   BNP No results for input(s): BNP in the last 72 hours. D-Dimer No results for input(s): DDIMER in the last 72 hours. Hemoglobin A1C No results for input(s): HGBA1C in the last 72 hours. Fasting Lipid Panel No results for input(s): CHOL, HDL, LDLCALC, TRIG, CHOLHDL, LDLDIRECT in the last 72 hours. Thyroid Function Tests No results for input(s): TSH, T4TOTAL, T3FREE, THYROIDAB in the last 72 hours.  Invalid input(s): FREET3  Telemetry    nsr  ECG    nsr with no ischemia  Radiology    Dg Chest Portable 1 View  Result Date: 07/14/2017 CLINICAL DATA:  Shortness of breath EXAM: PORTABLE CHEST 1 VIEW COMPARISON:  May 17, 2017 FINDINGS: There remains interstitial pulmonary edema without consolidation. Heart is borderline  enlarged with mild pulmonary venous hypertension. No adenopathy. Patient is status post coronary artery bypass grafting. No evident bone lesions. IMPRESSION: There is a degree of pulmonary vascular congestion with interstitial pulmonary edema. No consolidation. Appearance similar to prior study. Electronically Signed   By: Lowella Grip III M.D.   On: 07/14/2017 09:29    Assessment & Plan    Pt with ef of approx 35% admitted with volume overload. Discussed daily weights and low sodium diet. Improved clinically. Anxious to go home. OK for discharge on lasix 40 mg daily, carvedilol 3.125 mg bid, plavix 75 mg dialy. Follow up with Dr. Alfonso Patten, [place], [place] or if needing follow up locally, I will be glad to see.   Signed, Javier Docker  MD 07/15/2017, 10:45 AM  Pager: [phone]

## 2017-07-15 NOTE — Progress Notes (Signed)
Pt discharging home. IV and heart monitor removed. Discharge instructions and CHF education reviewed with pt.

## 2017-07-15 NOTE — Telephone Encounter (Signed)
Copied from [place] [id number]. Topic: Quick Communication - See Telephone Encounter >> Jul 15, 2017  9:58 AM Robina Ade, Helene Kelp D wrote: CRM for notification. See Telephone encounter for: 07/15/17. JoAnn with [facility] hospital called trying to schdule an appt for patient for a hospital f/u with Dr. Nicki Reaper but since she is full and will be out of the office, Patina CMA said that they would call the patient at home to see where she can be worked in. Please call patient back to schedule a hospital f/u.

## 2017-07-15 NOTE — Telephone Encounter (Signed)
Patient needs a hospital follow up visit with Dr. Nicki Reaper she will be discharged from hospital soon.

## 2017-07-15 NOTE — Discharge Summary (Signed)
[place] [facility] [place] NAME: Jennifer [name]    MR#:  [id number]  DATE OF BIRTH:  18-Apr-1950  DATE OF ADMISSION:  07/14/2017 ADMITTING PHYSICIAN: Gladstone Lighter, MD  DATE OF DISCHARGE: July 15, 2017  PRIMARY CARE PHYSICIAN: Einar Pheasant, MD    ADMISSION DIAGNOSIS:  Pulmonary edema with congestive heart failure ([place]) [I50.1] Acute respiratory failure with hypoxia ([place]) [J96.01]  DISCHARGE DIAGNOSIS:  Active Problems:   CHF exacerbation ([place])   SECONDARY DIAGNOSIS:   Past Medical History:  Diagnosis Date  . CAD (coronary artery disease) 2003   s/p CABG x 5 (Dr Evelina Dun), MI- 2010  . Endometriosis   . Hypercholesteremia   . Hypertension   . PVD (peripheral vascular disease) ([place])    s/p intervention (left) - 2004, right (2006)  . Systolic CHF ([place])    last EF 40%    HOSPITAL COURSE:  67 year old female status post history of CAD status post 5 vessel CABG in 2010, peripheral vascular disease, chronic systolic heart failure ejection fraction 40 to 45% who presented to the emergency room with shortness of breath.  1.  Acute on chronic systolic heart failure EF 40 to 45%: Use with IV Lasix.  She will continue home dose of oral Lasix.  She is currently asymptomatic.  She does describe weight gain however she does not check her weights daily.  She was asked to check her weight daily and if she has 3 pound weight gain or more she needs to call her cardiologist and take an extra dose of Lasix.  2.  History of CAD/CABG: She will continue Plavix and Coreg  3.  Essential hypertension: Continue losartan and Coreg  4.  PAD: Continue Plavix 5.  Hyponatremia from CHF which is improved with diuresis. She will need repeat BMP in 3 to 5 days by PCP or cardiologist. DISCHARGE CONDITIONS AND DIET:   Stable for discharge on heart healthy diet  CONSULTS OBTAINED:  Treatment Team:  Teodoro Spray, MD  DRUG ALLERGIES:   Allergies  Allergen  Reactions  . Cyclobenzaprine Other (See Comments)    Felt bad  . Prednisone Other (See Comments)    Felt bad    DISCHARGE MEDICATIONS:   Allergies as of 07/15/2017      Reactions   Cyclobenzaprine Other (See Comments)   Felt bad   Prednisone Other (See Comments)   Felt bad      Medication List    STOP taking these medications   diphenhydrAMINE 25 MG tablet Commonly known as:  BENADRYL   rosuvastatin 5 MG tablet Commonly known as:  CRESTOR     TAKE these medications   ALPRAZolam 0.5 MG tablet Commonly known as:  XANAX Take 1 tablet (0.5 mg total) by mouth daily as needed for sleep.   carvedilol 3.125 MG tablet Commonly known as:  COREG Take 1 tablet (3.125 mg total) by mouth 2 (two) times daily with a meal.   clopidogrel 75 MG tablet Commonly known as:  PLAVIX Take 1 tablet (75 mg total) by mouth daily.   Co Q 10 100 MG Caps Take 100 mg by mouth daily.   [employer] D3 2000 units Caps Generic drug:  Cholecalciferol Take 2,000 Units by mouth 2 (two) times daily.   furosemide 40 MG tablet Commonly known as:  LASIX Take 40 mg by mouth daily.   Garlic 9211 MG Caps Take 1,000 mg by mouth daily.   isosorbide mononitrate 60 MG 24 hr tablet  Commonly known as:  IMDUR Take 60-120 mg by mouth 2 (two) times daily. Take 120 mg by mouth in the morning and 60 mg by mouth in the evening.   KRILL OIL PO Take 2,000 mg by mouth daily.   losartan 25 MG tablet Commonly known as:  COZAAR Take 25 mg by mouth daily.   Magnesium 250 MG Tabs Take 500 mg by mouth at bedtime.   NASAL ALLERGY NA Place 1 spray into the nose daily. X Clear   nitroGLYCERIN 0.4 MG SL tablet Commonly known as:  NITROSTAT Place 1 tablet (0.4 mg total) under the tongue every 5 (five) minutes as needed for chest pain.   OIL OF OREGANO PO Take 1,920 mg by mouth daily as needed (SINUS).   TURMERIC PO Take 2 tablets by mouth daily.   vitamin C 1000 MG tablet Take 1,000 mg by mouth 2 (two) times  daily.   vitamin E 400 UNIT capsule Generic drug:  vitamin E Take 400 Units by mouth every Monday, Wednesday, and Friday.         Today   CHIEF COMPLAINT:  SHe is doing well this morning.  No shortness of breath or chest pain.  No lower extremity edema.   VITAL SIGNS:  Blood pressure (!) 98/45, pulse 65, temperature 98.1 F (36.7 C), temperature source Oral, resp. rate 18, height 5\' 5"  (1.651 m), weight 73 kg (161 lb), SpO2 92 %.   REVIEW OF SYSTEMS:  Review of Systems  Constitutional: Negative.  Negative for chills, fever and malaise/fatigue.  HENT: Negative.  Negative for ear discharge, ear pain, hearing loss, nosebleeds and sore throat.   Eyes: Negative.  Negative for blurred vision and pain.  Respiratory: Negative.  Negative for cough, hemoptysis, shortness of breath and wheezing.   Cardiovascular: Negative.  Negative for chest pain, palpitations and leg swelling.  Gastrointestinal: Negative.  Negative for abdominal pain, blood in stool, diarrhea, nausea and vomiting.  Genitourinary: Negative.  Negative for dysuria.  Musculoskeletal: Negative.  Negative for back pain.  Skin: Negative.   Neurological: Negative for dizziness, tremors, speech change, focal weakness, seizures and headaches.  Endo/Heme/Allergies: Negative.  Does not bruise/bleed easily.  Psychiatric/Behavioral: Negative.  Negative for depression, hallucinations and suicidal ideas.     PHYSICAL EXAMINATION:  GENERAL:  67 y.o.-year-old patient lying in the bed with no acute distress.  NECK:  Supple, no jugular venous distention. No thyroid enlargement, no tenderness.  LUNGS: Normal breath sounds bilaterally, no wheezing, rales,rhonchi  No use of accessory muscles of respiration.  CARDIOVASCULAR: S1, S2 normal. No murmurs, rubs, or gallops.  ABDOMEN: Soft, non-tender, non-distended. Bowel sounds present. No organomegaly or mass.  EXTREMITIES: No pedal edema, cyanosis, or clubbing.  PSYCHIATRIC: The patient  is alert and oriented x 3.  SKIN: No obvious rash, lesion, or ulcer.   DATA REVIEW:   CBC Recent Labs  Lab 07/15/17 0511  WBC 7.2  HGB 12.8  HCT 37.2  PLT 210    Chemistries  Recent Labs  Lab 07/15/17 0511  NA 133*  K 3.7  CL 100*  CO2 25  GLUCOSE 98  BUN 14  CREATININE 0.93  CALCIUM 8.8*    Cardiac Enzymes Recent Labs  Lab 07/14/17 0917  TROPONINI <0.03    Microbiology Results  @[email or web address] @  RADIOLOGY:  Dg Chest Portable 1 View  Result Date: 07/14/2017 CLINICAL DATA:  Shortness of breath EXAM: PORTABLE CHEST 1 VIEW COMPARISON:  May 17, 2017 FINDINGS: There remains interstitial pulmonary edema without  consolidation. Heart is borderline enlarged with mild pulmonary venous hypertension. No adenopathy. Patient is status post coronary artery bypass grafting. No evident bone lesions. IMPRESSION: There is a degree of pulmonary vascular congestion with interstitial pulmonary edema. No consolidation. Appearance similar to prior study. Electronically Signed   By: Lowella Grip III M.D.   On: 07/14/2017 09:29      Allergies as of 07/15/2017      Reactions   Cyclobenzaprine Other (See Comments)   Felt bad   Prednisone Other (See Comments)   Felt bad      Medication List    STOP taking these medications   diphenhydrAMINE 25 MG tablet Commonly known as:  BENADRYL   rosuvastatin 5 MG tablet Commonly known as:  CRESTOR     TAKE these medications   ALPRAZolam 0.5 MG tablet Commonly known as:  XANAX Take 1 tablet (0.5 mg total) by mouth daily as needed for sleep.   carvedilol 3.125 MG tablet Commonly known as:  COREG Take 1 tablet (3.125 mg total) by mouth 2 (two) times daily with a meal.   clopidogrel 75 MG tablet Commonly known as:  PLAVIX Take 1 tablet (75 mg total) by mouth daily.   Co Q 10 100 MG Caps Take 100 mg by mouth daily.   CVS D3 2000 units Caps Generic drug:  Cholecalciferol Take 2,000 Units by mouth 2 (two) times daily.    furosemide 40 MG tablet Commonly known as:  LASIX Take 40 mg by mouth daily.   Garlic 7062 MG Caps Take 1,000 mg by mouth daily.   isosorbide mononitrate 60 MG 24 hr tablet Commonly known as:  IMDUR Take 60-120 mg by mouth 2 (two) times daily. Take 120 mg by mouth in the morning and 60 mg by mouth in the evening.   KRILL OIL PO Take 2,000 mg by mouth daily.   losartan 25 MG tablet Commonly known as:  COZAAR Take 25 mg by mouth daily.   Magnesium 250 MG Tabs Take 500 mg by mouth at bedtime.   NASAL ALLERGY NA Place 1 spray into the nose daily. X Clear   nitroGLYCERIN 0.4 MG SL tablet Commonly known as:  NITROSTAT Place 1 tablet (0.4 mg total) under the tongue every 5 (five) minutes as needed for chest pain.   OIL OF OREGANO PO Take 1,920 mg by mouth daily as needed (SINUS).   TURMERIC PO Take 2 tablets by mouth daily.   vitamin C 1000 MG tablet Take 1,000 mg by mouth 2 (two) times daily.   vitamin E 400 UNIT capsule Generic drug:  vitamin E Take 400 Units by mouth every Monday, Wednesday, and Friday.          Management plans discussed with the patient and she is in agreement. Stable for discharge home  Patient should follow up with cardiology  D/w Dr Ubaldo Glassing  CODE STATUS:     Code Status Orders  (From admission, onward)        Start     Ordered   07/14/17 1311  Full code  Continuous     07/14/17 1310    Code Status History    Date Active Date Inactive Code Status Order ID Comments User Context   05/18/2017 0147 05/19/2017 1739 Full Code [id number]  Saundra Shelling, MD Inpatient      TOTAL TIME TAKING CARE OF THIS PATIENT: 38 minutes.    Note: This dictation was prepared with Dragon dictation along with smaller phrase technology. Any  transcriptional errors that result from this process are unintentional.  Chrishun Scheer M.D on 07/15/2017 at 9:25 AM  Between 7am to 6pm - Pager - [phone] After 6pm go to [email or web address] - password [email or web address]  Office  [phone]  CC: Primary care physician; Einar Pheasant, MD

## 2017-07-16 ENCOUNTER — Other Ambulatory Visit: Payer: Self-pay | Admitting: Internal Medicine

## 2017-07-16 DIAGNOSIS — I509 Heart failure, unspecified: Secondary | ICD-10-CM

## 2017-07-16 LAB — HIV ANTIBODY (ROUTINE TESTING W REFLEX): HIV Screen 4th Generation wRfx: NONREACTIVE

## 2017-07-16 NOTE — Telephone Encounter (Signed)
Called and scheduled for HFU and advised patient may need to be seen sooner if symptoms worsen.

## 2017-07-16 NOTE — Telephone Encounter (Signed)
Transition Care Management Follow-up Telephone Call  How have you been since you were released from the hospital? Patient stated she feels weak has appointment with cardiology on Monday. ( Dr. Megan Salon), Patient stated she is coughing up a lot of Brown mucus now.   Do you understand why you were in the hospital? yes   Do you understand the discharge instrcutions? yes  Items Reviewed:  Medications reviewed: yes  Allergies reviewed: yes  Dietary changes reviewed: yes  Referrals reviewed: yes   Functional Questionnaire:   Activities of Daily Living (ADLs):   She states they are independent in the following: ambulation, bathing and hygiene, feeding, continence, grooming, toileting and dressing States they require assistance with the following: Patient needs no assistance at this time.   Any transportation issues/concerns?: no   Any patient concerns? Yes, how can I get the fluid off my body.   Confirmed importance and date/time of follow-up visits scheduled: yes   Confirmed with patient if condition begins to worsen call PCP or go to the ER.  Patient was given the Call-a-Nurse line [phone]: yes

## 2017-07-16 NOTE — Progress Notes (Signed)
Order placed for f/u lab.   

## 2017-07-16 NOTE — Telephone Encounter (Signed)
Need place to schedule for HFU

## 2017-07-16 NOTE — Telephone Encounter (Signed)
I am not in the office next week.  I can see her 07/28/17 at 1:00.  If problems before or if issues with cough productive mucus, may need to be seen earlier.

## 2017-07-17 ENCOUNTER — Telehealth: Payer: Self-pay | Admitting: Internal Medicine

## 2017-07-17 NOTE — Telephone Encounter (Signed)
-----   Message from Lars Masson, LPN sent at 4/49/6759  5:49 PM EDT ----- Regarding: RE: lab appt Pt scheduled for Tuesday  ----- Message ----- From: Einar Pheasant, MD Sent: 07/16/2017  12:21 AM To: Lars Masson, LPN Subject: lab appt                                       Ms Mcglade was just discharged from the hospital.  They recommended a f/u lab in 3-5 days after discharge.  Need to schedule.  Please schedule non fasting lab and notify pt of appt date and time.  Please let me know when scheduled.    Thanks.   Dr Nicki Reaper

## 2017-07-19 ENCOUNTER — Other Ambulatory Visit (INDEPENDENT_AMBULATORY_CARE_PROVIDER_SITE_OTHER): Payer: Medicare Other

## 2017-07-19 DIAGNOSIS — I5042 Chronic combined systolic (congestive) and diastolic (congestive) heart failure: Secondary | ICD-10-CM | POA: Diagnosis not present

## 2017-07-19 DIAGNOSIS — I2510 Atherosclerotic heart disease of native coronary artery without angina pectoris: Secondary | ICD-10-CM | POA: Diagnosis not present

## 2017-07-19 DIAGNOSIS — R0600 Dyspnea, unspecified: Secondary | ICD-10-CM | POA: Diagnosis not present

## 2017-07-19 DIAGNOSIS — I509 Heart failure, unspecified: Secondary | ICD-10-CM

## 2017-07-19 DIAGNOSIS — I429 Cardiomyopathy, unspecified: Secondary | ICD-10-CM | POA: Diagnosis not present

## 2017-07-19 LAB — BASIC METABOLIC PANEL
BUN: 13 mg/dL (ref 6–23)
CALCIUM: 9.9 mg/dL (ref 8.4–10.5)
CO2: 30 mEq/L (ref 19–32)
Chloride: 99 mEq/L (ref 96–112)
Creatinine, Ser: 0.96 mg/dL (ref 0.40–1.20)
GFR: 61.64 mL/min (ref >60.00)
Glucose, Bld: 95 mg/dL (ref 70–99)
Potassium: 4.1 mEq/L (ref 3.5–5.1)
SODIUM: 138 meq/L (ref 135–145)

## 2017-07-28 ENCOUNTER — Encounter: Payer: Self-pay | Admitting: Internal Medicine

## 2017-07-28 ENCOUNTER — Ambulatory Visit (INDEPENDENT_AMBULATORY_CARE_PROVIDER_SITE_OTHER): Payer: Medicare Other | Admitting: Internal Medicine

## 2017-07-28 VITALS — BP 138/70 | HR 78 | Temp 98.0°F | Resp 18 | Wt 161.3 lb

## 2017-07-28 DIAGNOSIS — I2510 Atherosclerotic heart disease of native coronary artery without angina pectoris: Secondary | ICD-10-CM

## 2017-07-28 DIAGNOSIS — I10 Essential (primary) hypertension: Secondary | ICD-10-CM

## 2017-07-28 DIAGNOSIS — I70213 Atherosclerosis of native arteries of extremities with intermittent claudication, bilateral legs: Secondary | ICD-10-CM | POA: Diagnosis not present

## 2017-07-28 DIAGNOSIS — I509 Heart failure, unspecified: Secondary | ICD-10-CM | POA: Diagnosis not present

## 2017-07-28 DIAGNOSIS — E7800 Pure hypercholesterolemia, unspecified: Secondary | ICD-10-CM | POA: Diagnosis not present

## 2017-07-28 LAB — BASIC METABOLIC PANEL
BUN: 14 mg/dL (ref 6–23)
CALCIUM: 10.2 mg/dL (ref 8.4–10.5)
CO2: 29 mEq/L (ref 19–32)
Chloride: 96 mEq/L (ref 96–112)
Creatinine, Ser: 0.85 mg/dL (ref 0.40–1.20)
GFR: 70.92 mL/min (ref >60.00)
Glucose, Bld: 96 mg/dL (ref 70–99)
Potassium: 4.4 mEq/L (ref 3.5–5.1)
Sodium: 133 mEq/L — ABNORMAL LOW (ref 135–145)

## 2017-07-28 MED ORDER — ALPRAZOLAM 0.5 MG PO TABS: ORAL_TABLET | ORAL | 0 refills | Status: DC

## 2017-07-28 NOTE — Progress Notes (Signed)
[name], female   DOB: 1950/12/27, 67 y.o.   MRN: [id number]   Subjective:    [name], female    DOB: 01/07/1951, 67 y.o.   MRN: [id number]  HPI  Patient here for hospital follow up.  Was admitted 07/14/17 with acute on chronic systolic heart failure with EF 40-45%.  Was placed on IV lasix.  Diuresed.  Discharged to continue oral lasix.  On questioning her, she had been eating an increased amount of pop corn prior to admission.  Overall feels better now.  Breathing better.  No chest pain.  Eating.  No vomiting.  Bowels moving.  Just saw her cardiologist.  Started on aldactone and aspirin.  Needs f/u metabolic panel.     Past Medical History:  Diagnosis Date  . CAD (coronary artery disease) 2003   s/p CABG x 5 (Dr Evelina Dun), MI- 2010  . Endometriosis   . Hypercholesteremia   . Hypertension   . PVD (peripheral vascular disease) ([place])    s/p intervention (left) - 2004, right (2006)  . Systolic CHF ([place])    last EF 40%   Past Surgical History:  Procedure Laterality Date  . ABDOMINAL HYSTERECTOMY    . BREAST SURGERY  2004   cyst removed  . cataract surgery  2007   left  . COLONOSCOPY WITH PROPOFOL N/A 10/30/2015   Procedure: COLONOSCOPY WITH PROPOFOL;  Surgeon: Robert Bellow, MD;  Location: [facility] ENDOSCOPY;  Service: Endoscopy;  Laterality: N/A;  . CORONARY ARTERY BYPASS GRAFT     2003   Family History  Problem Relation Age of Onset  . Heart disease Mother   . CVA Mother   . Heart disease Father        CABG  . Heart disease Unknown        aunts and uncles  . Breast cancer Neg Hx   . Colon cancer Neg Hx    Social History   Socioeconomic History  . Marital status: Married    Spouse name: Not on file  . Number of children: Not on file  . Years of education: Not on file  . Highest education level: Not on file  Occupational History  . Not on file  Social Needs  . Financial resource strain: Not on file  . Food insecurity:    Worry: Not  on file    Inability: Not on file  . Transportation needs:    Medical: Not on file    Non-medical: Not on file  Tobacco Use  . Smoking status: Former Smoker    Last attempt to quit: 09/03/2008    Years since quitting: 8.9  . Smokeless tobacco: Never Used  Substance and Sexual Activity  . Alcohol use: Yes    Alcohol/week: 0.0 oz    Comment: once a year  . Drug use: No  . Sexual activity: Not on file  Lifestyle  . Physical activity:    Days per week: Not on file    Minutes per session: Not on file  . Stress: Not on file  Relationships  . Social connections:    Talks on phone: Not on file    Gets together: Not on file    Attends religious service: Not on file    Active member of club or organization: Not on file    Attends meetings of clubs or organizations: Not on file    Relationship status: Not on file  Other Topics Concern  . Not  on file  Social History Narrative   Lives at home with husband, independent at baseline    Outpatient Encounter Medications as of 07/28/2017  Medication Sig  . ALPRAZolam (XANAX) 0.5 MG tablet Take 1/2 - 1 tablet q hs prn.  . Ascorbic Acid (VITAMIN C) 1000 MG tablet Take 1,000 mg by mouth 2 (two) times daily.   Marland Kitchen aspirin EC 81 MG tablet Take 81 mg by mouth daily.  . carvedilol (COREG) 3.125 MG tablet Take 1 tablet (3.125 mg total) by mouth 2 (two) times daily with a meal.  . Cholecalciferol ([employer]S D3) 2000 units CAPS Take 2,000 Units by mouth 2 (two) times daily.  . clopidogrel (PLAVIX) 75 MG tablet Take 1 tablet (75 mg total) by mouth daily.  . Coenzyme Q10 (CO Q 10) 100 MG CAPS Take 100 mg by mouth daily.  . Cromolyn Sodium (NASAL ALLERGY NA) Place 1 spray into the nose daily. X Clear   . furosemide (LASIX) 40 MG tablet Take 40 mg by mouth daily.   . Garlic 3151 MG CAPS Take 1,000 mg by mouth daily.  . isosorbide mononitrate (IMDUR) 60 MG 24 hr tablet Take 60-120 mg by mouth 2 (two) times daily. Take 120 mg by mouth in the morning and 60 mg by  mouth in the evening.  Marland Kitchen KRILL OIL PO Take 2,000 mg by mouth daily.   Marland Kitchen losartan (COZAAR) 25 MG tablet Take 25 mg by mouth daily.   . Magnesium 250 MG TABS Take 500 mg by mouth at bedtime.   . nitroGLYCERIN (NITROSTAT) 0.4 MG SL tablet Place 1 tablet (0.4 mg total) under the tongue every 5 (five) minutes as needed for chest pain.  . OIL OF OREGANO PO Take 1,920 mg by mouth daily as needed (SINUS).   Marland Kitchen spironolactone (ALDACTONE) 25 MG tablet Take 25 mg by mouth daily.  . TURMERIC PO Take 2 tablets by mouth daily.   . vitamin E (VITAMIN E) 400 UNIT capsule Take 400 Units by mouth every Monday, Wednesday, and Friday.   . [DISCONTINUED] ALPRAZolam (XANAX) 0.5 MG tablet Take 1 tablet (0.5 mg total) by mouth daily as needed for sleep.   No facility-administered encounter medications on file as of 07/28/2017.     Review of Systems  Constitutional: Negative for appetite change and unexpected weight change.  HENT: Negative for congestion and sinus pressure.   Respiratory: Negative for cough and chest tightness.        Breathing better.  Chest feels better.    Cardiovascular: Negative for chest pain, palpitations and leg swelling.  Gastrointestinal: Negative for abdominal pain, diarrhea, nausea and vomiting.  Genitourinary: Negative for difficulty urinating and dysuria.  Musculoskeletal: Negative for joint swelling and myalgias.  Skin: Negative for color change and rash.  Neurological: Negative for dizziness, light-headedness and headaches.  Psychiatric/Behavioral: Negative for agitation and dysphoric mood.       Objective:    Physical Exam  Constitutional: She appears well-developed and well-nourished.  HENT:  Nose: Nose normal.  Mouth/Throat: Oropharynx is clear and moist.  Neck: Neck supple. No thyromegaly present.  Cardiovascular: Normal rate and regular rhythm.  Pulmonary/Chest: Breath sounds normal. No respiratory distress. She has no wheezes.  Abdominal: Soft. Bowel sounds are  normal. There is no tenderness.  Musculoskeletal: She exhibits no edema or tenderness.  Lymphadenopathy:    She has no cervical adenopathy.  Skin: No rash noted. No erythema.  Psychiatric: She has a normal mood and affect. Her behavior is normal.  BP 138/70 (BP Location: Left Arm, Patient Position: Sitting, Cuff Size: Normal)   Pulse 78   Temp 98 F (36.7 C) (Oral)   Resp 18   Wt 161 lb 4.8 oz (73.2 kg)   SpO2 97%   BMI 26.84 kg/m  Wt Readings from Last 3 Encounters:  07/28/17 161 lb 4.8 oz (73.2 kg)  07/15/17 161 lb (73 kg)  05/25/17 163 lb 6.4 oz (74.1 kg)     Lab Results  Component Value Date   WBC 7.2 07/15/2017   HGB 12.8 07/15/2017   HCT 37.2 07/15/2017   PLT 210 07/15/2017   GLUCOSE 96 07/28/2017   CHOL 256 (H) 05/25/2017   TRIG 320.0 (H) 05/25/2017   HDL 45.30 05/25/2017   LDLDIRECT 161.0 05/25/2017   LDLCALC 157 07/24/2015   ALT 21 05/17/2017   AST 28 05/17/2017   NA 133 (L) 07/28/2017   K 4.4 07/28/2017   CL 96 07/28/2017   CREATININE 0.85 07/28/2017   BUN 14 07/28/2017   CO2 29 07/28/2017   TSH 1.55 05/25/2017   INR 1.00 05/18/2017   HGBA1C 5.8 06/30/2017    Dg Chest Portable 1 View  Result Date: 07/14/2017 CLINICAL DATA:  Shortness of breath EXAM: PORTABLE CHEST 1 VIEW COMPARISON:  May 17, 2017 FINDINGS: There remains interstitial pulmonary edema without consolidation. Heart is borderline enlarged with mild pulmonary venous hypertension. No adenopathy. Patient is status post coronary artery bypass grafting. No evident bone lesions. IMPRESSION: There is a degree of pulmonary vascular congestion with interstitial pulmonary edema. No consolidation. Appearance similar to prior study. Electronically Signed   By: Lowella Grip III M.D.   On: 07/14/2017 09:29       Assessment & Plan:   Problem List Items Addressed This Visit    Atherosclerosis of native arteries of extremity with intermittent claudication ([place])    Followed by Dr Lucky Cowboy.  Stable.          CAD (coronary artery disease)    History of CABG.  Followed by cardiology.  Just evaluated.  Added spironolactone and aspirin.  Planning for nuclear stress test end of 08/2017.        Relevant Medications   aspirin EC 81 MG tablet   spironolactone (ALDACTONE) 25 MG tablet   CHF (congestive heart failure) ([place]) - Primary    Recently admitted with CHF.  Diuresed.  On lasix.  Just saw cardiology.  Spironolactone added.  Check metabolic panel today.  Breathing better.  No increased sob.        Relevant Medications   aspirin EC 81 MG tablet   spironolactone (ALDACTONE) 25 MG tablet   Other Relevant Orders   Basic metabolic panel (Completed)   Hypercholesteremia    Has had intolerance to cholesterol medication.  Have discussed importance of taking.  She declines.        Relevant Medications   aspirin EC 81 MG tablet   spironolactone (ALDACTONE) 25 MG tablet   Hypertension    Blood pressure under good control.  Continue same medication regimen.  Follow pressures.  Follow metabolic panel.        Relevant Medications   aspirin EC 81 MG tablet   spironolactone (ALDACTONE) 25 MG tablet       Einar Pheasant, MD

## 2017-07-29 ENCOUNTER — Other Ambulatory Visit: Payer: Self-pay | Admitting: Internal Medicine

## 2017-07-29 DIAGNOSIS — E871 Hypo-osmolality and hyponatremia: Secondary | ICD-10-CM

## 2017-07-29 NOTE — Progress Notes (Signed)
Order placed for f/u lab.   

## 2017-07-31 ENCOUNTER — Encounter: Payer: Self-pay | Admitting: Internal Medicine

## 2017-07-31 NOTE — Assessment & Plan Note (Signed)
Recently admitted with CHF.  Diuresed.  On lasix.  Just saw cardiology.  Spironolactone added.  Check metabolic panel today.  Breathing better.  No increased sob.

## 2017-07-31 NOTE — Assessment & Plan Note (Signed)
Followed by Dr Dew.  Stable.  

## 2017-07-31 NOTE — Assessment & Plan Note (Signed)
Has had intolerance to cholesterol medication.  Have discussed importance of taking.  She declines.

## 2017-07-31 NOTE — Assessment & Plan Note (Signed)
History of CABG.  Followed by cardiology.  Just evaluated.  Added spironolactone and aspirin.  Planning for nuclear stress test end of 08/2017.

## 2017-07-31 NOTE — Assessment & Plan Note (Signed)
Blood pressure under good control.  Continue same medication regimen.  Follow pressures.  Follow metabolic panel.   

## 2017-08-06 ENCOUNTER — Ambulatory Visit: Payer: Medicare Other | Attending: Family | Admitting: Family

## 2017-08-06 ENCOUNTER — Encounter: Payer: Self-pay | Admitting: Family

## 2017-08-06 VITALS — BP 132/63 | HR 73 | Resp 18 | Ht 68.0 in | Wt 160.0 lb

## 2017-08-06 DIAGNOSIS — Z8249 Family history of ischemic heart disease and other diseases of the circulatory system: Secondary | ICD-10-CM | POA: Insufficient documentation

## 2017-08-06 DIAGNOSIS — I5022 Chronic systolic (congestive) heart failure: Secondary | ICD-10-CM | POA: Diagnosis not present

## 2017-08-06 DIAGNOSIS — Z7982 Long term (current) use of aspirin: Secondary | ICD-10-CM | POA: Diagnosis not present

## 2017-08-06 DIAGNOSIS — E7800 Pure hypercholesterolemia, unspecified: Secondary | ICD-10-CM | POA: Diagnosis not present

## 2017-08-06 DIAGNOSIS — I2510 Atherosclerotic heart disease of native coronary artery without angina pectoris: Secondary | ICD-10-CM | POA: Insufficient documentation

## 2017-08-06 DIAGNOSIS — Z7902 Long term (current) use of antithrombotics/antiplatelets: Secondary | ICD-10-CM | POA: Insufficient documentation

## 2017-08-06 DIAGNOSIS — I10 Essential (primary) hypertension: Secondary | ICD-10-CM

## 2017-08-06 DIAGNOSIS — I110 Hypertensive heart disease with heart failure: Secondary | ICD-10-CM | POA: Diagnosis not present

## 2017-08-06 DIAGNOSIS — Z823 Family history of stroke: Secondary | ICD-10-CM | POA: Diagnosis not present

## 2017-08-06 DIAGNOSIS — Z79899 Other long term (current) drug therapy: Secondary | ICD-10-CM | POA: Insufficient documentation

## 2017-08-06 DIAGNOSIS — I252 Old myocardial infarction: Secondary | ICD-10-CM | POA: Diagnosis not present

## 2017-08-06 DIAGNOSIS — Z87891 Personal history of nicotine dependence: Secondary | ICD-10-CM | POA: Insufficient documentation

## 2017-08-06 DIAGNOSIS — E785 Hyperlipidemia, unspecified: Secondary | ICD-10-CM | POA: Insufficient documentation

## 2017-08-06 DIAGNOSIS — I739 Peripheral vascular disease, unspecified: Secondary | ICD-10-CM | POA: Insufficient documentation

## 2017-08-06 DIAGNOSIS — Z951 Presence of aortocoronary bypass graft: Secondary | ICD-10-CM | POA: Diagnosis not present

## 2017-08-06 MED ORDER — SPIRONOLACTONE 25 MG PO TABS: 25.0000 mg | ORAL_TABLET | Freq: Every day | ORAL | 6 refills | Status: AC

## 2017-08-06 NOTE — Patient Instructions (Signed)
Continue weighing daily and call for an overnight weight gain of > 2 pounds or a weekly weight gain of >5 pounds. 

## 2017-08-06 NOTE — Progress Notes (Signed)
[name], female    DOB: [birth year], 67 y.o.   MRN: [id number]  HPI  Jennifer [name] is a 67 y/o female with a history of CAD, MI, hyperlipidemia, HTN, PVD, previous tobacco use and chronic heart failre.   Echo report from 05/18/17 reviewed and showed an EF of 40-45% along with mild MR.   Admitted 07/14/17 due to acute on chronic HF. Cardiology consulted. IV lasix initially given and then transitioned to oral diuretics. Discharged the following day. Admitted 05/17/17 due to acute HF exacerbation. Cardiology consulted. Given IV lasix initially and then oral diuretics. Elevated troponin thought to be due to demand ischemia. Discharged in 2 days.   She presents today for her initial visit with a chief complaint of minimal shortness of breath upon moderate exertion. She says this has been chronic in nature for a few years. She can walk ~ 1 block before becoming short of breath and when that occurs, she stops to sit down and rest for a few minutes until her breathing improves. She has associated fatigue and left eye redness along with this. She denies any difficulty sleeping, abdominal distention, palpitations, pedal edema, chest pain, dizziness or weight gain.   Past Medical History:  Diagnosis Date  . CAD (coronary artery disease) 2003   s/p CABG x 5 (Dr Evelina Dun), MI- 2010  . Endometriosis   . Hypercholesteremia   . Hypertension   . PVD (peripheral vascular disease) ([place])    s/p intervention (left) - 2004, right (2006)  . Systolic CHF ([place])    last EF 40%   Past Surgical History:  Procedure Laterality Date  . ABDOMINAL HYSTERECTOMY    . BREAST SURGERY  2004   cyst removed  . cataract surgery  2007   left  . COLONOSCOPY WITH PROPOFOL N/A 10/30/2015   Procedure: COLONOSCOPY WITH PROPOFOL;  Surgeon: Robert Bellow, MD;  Location: [facility] ENDOSCOPY;  Service: Endoscopy;  Laterality: N/A;  . CORONARY ARTERY BYPASS GRAFT     2003   Family History  Problem Relation Age of Onset  .  Heart disease Mother   . CVA Mother   . Heart disease Father        CABG  . Heart disease Unknown        aunts and uncles  . Breast cancer Neg Hx   . Colon cancer Neg Hx    Social History   Tobacco Use  . Smoking status: Former Smoker    Last attempt to quit: 09/03/2008    Years since quitting: 8.9  . Smokeless tobacco: Never Used  Substance Use Topics  . Alcohol use: Yes    Alcohol/week: 0.0 oz    Comment: once a year   Allergies  Allergen Reactions  . Cyclobenzaprine Other (See Comments)    Felt bad  . Prednisone Other (See Comments)    Felt bad   Prior to Admission medications   Medication Sig Start Date End Date Taking? Authorizing Provider  ALPRAZolam Duanne Moron) 0.5 MG tablet Take 1/2 - 1 tablet q hs prn. 07/28/17  Yes Einar Pheasant, MD  Ascorbic Acid (VITAMIN C) 1000 MG tablet Take 1,000 mg by mouth 2 (two) times daily.    Yes [provider]  aspirin EC 81 MG tablet Take 81 mg by mouth daily.   Yes [provider]  carvedilol (COREG) 3.125 MG tablet Take 1 tablet (3.125 mg total) by mouth 2 (two) times daily with a meal. 12/01/11  Yes Scott, Leal,  MD  Cholecalciferol (CVS D3) 2000 units CAPS Take 2,000 Units by mouth 2 (two) times daily.   Yes [provider]  clopidogrel (PLAVIX) 75 MG tablet Take 1 tablet (75 mg total) by mouth daily. 11/04/15  Yes Byrnett, Forest Gleason, MD  Coenzyme Q10 (CO Q 10) 100 MG CAPS Take 100 mg by mouth daily.    Yes [provider]  furosemide (LASIX) 40 MG tablet Take 40 mg by mouth daily.  12/01/11  Yes Einar Pheasant, MD  Garlic 2620 MG CAPS Take 1,000 mg by mouth daily.   Yes [provider]  isosorbide mononitrate (IMDUR) 60 MG 24 hr tablet Take 60-120 mg by mouth 2 (two) times daily. Take 120 mg by mouth in the morning and 60 mg by mouth in the evening.   Yes [provider]  KRILL OIL PO Take 2,000 mg by mouth daily.    Yes [provider]  losartan (COZAAR) 25 MG tablet  Take 25 mg by mouth daily.    Yes [provider]  Magnesium 250 MG TABS Take 500 mg by mouth at bedtime.    Yes [provider]  Multiple Vitamin (MULTIVITAMIN WITH MINERALS) TABS tablet Take 1 tablet by mouth 2 (two) times daily.   Yes [provider]  nitroGLYCERIN (NITROSTAT) 0.4 MG SL tablet Place 1 tablet (0.4 mg total) under the tongue every 5 (five) minutes as needed for chest pain. 12/01/11  Yes Einar Pheasant, MD  OIL OF OREGANO PO Take 1,920 mg by mouth daily as needed (SINUS).    Yes [provider]  Red Yeast Rice 600 MG CAPS Take 2 capsules by mouth 2 (two) times daily.   Yes [provider]  Saccharomyces boulardii (PROBIOTIC) 250 MG CAPS Take 1 capsule by mouth daily.   Yes [provider]  Sodium Chloride-Xylitol (XLEAR SINUS CARE SPRAY) SOLN Place 1 spray into the nose daily.   Yes [provider]  spironolactone (ALDACTONE) 25 MG tablet Take 1 tablet (25 mg total) by mouth daily. 08/06/17  Yes Lavar Rosenzweig A, FNP  TURMERIC PO Take 2 tablets by mouth daily.    Yes [provider]  vitamin E (VITAMIN E) 400 UNIT capsule Take 400 Units by mouth every Monday, Wednesday, and Friday.    Yes [provider]   Review of Systems  Constitutional: Positive for fatigue (improving). Negative for appetite change.  HENT: Negative for congestion, postnasal drip and sore throat.   Eyes: Positive for redness (left eye with bursted blood vessel).  Respiratory: Positive for shortness of breath (when walking around 1 block). Negative for cough and chest tightness.   Cardiovascular: Negative for chest pain, palpitations and leg swelling.  Gastrointestinal: Negative for abdominal distention and abdominal pain.  Endocrine: Negative.   Genitourinary: Negative.   Musculoskeletal: Negative for back pain and neck pain.  Skin: Negative.   Allergic/Immunologic: Negative.   Neurological: Negative for dizziness and  light-headedness.  Hematological: Negative for adenopathy. Does not bruise/bleed easily.  Psychiatric/Behavioral: Negative for dysphoric mood and sleep disturbance (sleeping on 1 pillow). The patient is not nervous/anxious.     Vitals:   08/06/17 1039  BP: 132/63  Pulse: 73  Resp: 18  SpO2: 98%  Weight: 160 lb (72.6 kg)  Height: 5\' 8"  (1.727 m)   Wt Readings from Last 3 Encounters:  08/06/17 160 lb (72.6 kg)  07/28/17 161 lb 4.8 oz (73.2 kg)  07/15/17 161 lb (73 kg)   Lab Results  Component Value Date   CREATININE 0.85 07/28/2017   CREATININE 0.96 07/19/2017   CREATININE 0.93 07/15/2017    Physical Exam  Constitutional: She is oriented to person, place, and time. She appears well-developed and well-nourished.  HENT:  Head: Normocephalic and atraumatic.  Neck: Normal range of motion. Neck supple. No JVD present.  Cardiovascular: Normal rate and regular rhythm.  Pulmonary/Chest: Effort normal and breath sounds normal. No respiratory distress. She has no wheezes. She has no rales.  Abdominal: Soft. She exhibits no distension.  Musculoskeletal:       Right lower leg: She exhibits no tenderness and no edema.       Left lower leg: She exhibits no tenderness and no edema.  Neurological: She is alert and oriented to person, place, and time.  Skin: Skin is warm and dry.  Psychiatric: She has a normal mood and affect. Her behavior is normal.  Nursing note and vitals reviewed.  Assessment & Plan:  1: Chronic heart failure with reduced ejection fraction- - NYHA class II - euvolemic today - weighing daily and says that her weight has been stable. Instructed to call for an overnight weight gain of >2 pounds or a weekly weight gain of >5 pounds - not adding salt to her food and has been reading food labels. Had been eating microwave popcorn often prior to most recent hospitalization. Reviewed the importance of closely following a 2000mg  sodium diet and written dietary information was  given to her about this - sees cardiology Technical brewer) as well as cardiologist in [place], [place] (Alfonso Patten) - consider titrating up carvedilol at future visits - consider titrating up losartan at future visits - trying to be active with allowing for frequent rest periods - EF >40% so would not qualify for entresto use - BNP 05/17/17 was 586.0 - PharmD reconciled medications with the patient  2: HTN- - BP looks good today - saw PCP Nicki Reaper) 07/28/17 - BMP 07/28/17 reviewed and showed sodium 133, potassium 4.4 and GFR 70.92  Patient did not bring her medications nor a list. Each medication was verbally reviewed with the patient and she was encouraged to bring the bottles to every visit to confirm accuracy of list.  Return in 1 month or sooner for any questions/problems before then.

## 2017-08-09 ENCOUNTER — Other Ambulatory Visit (INDEPENDENT_AMBULATORY_CARE_PROVIDER_SITE_OTHER): Payer: Medicare Other

## 2017-08-09 DIAGNOSIS — E871 Hypo-osmolality and hyponatremia: Secondary | ICD-10-CM | POA: Diagnosis not present

## 2017-08-09 LAB — BASIC METABOLIC PANEL
BUN: 11 mg/dL (ref 6–23)
CALCIUM: 9.6 mg/dL (ref 8.4–10.5)
CO2: 26 meq/L (ref 19–32)
CREATININE: 0.89 mg/dL (ref 0.40–1.20)
Chloride: 95 mEq/L — ABNORMAL LOW (ref 96–112)
GFR: 67.25 mL/min (ref >60.00)
GLUCOSE: 105 mg/dL — AB (ref 70–99)
Potassium: 4.2 mEq/L (ref 3.5–5.1)
Sodium: 130 mEq/L — ABNORMAL LOW (ref 135–145)

## 2017-08-11 ENCOUNTER — Other Ambulatory Visit (INDEPENDENT_AMBULATORY_CARE_PROVIDER_SITE_OTHER): Payer: Medicare Other

## 2017-08-11 ENCOUNTER — Other Ambulatory Visit: Payer: Self-pay | Admitting: Internal Medicine

## 2017-08-11 DIAGNOSIS — E871 Hypo-osmolality and hyponatremia: Secondary | ICD-10-CM | POA: Diagnosis not present

## 2017-08-11 LAB — SODIUM: SODIUM: 136 meq/L (ref 135–145)

## 2017-08-11 NOTE — Progress Notes (Signed)
Order placed for lab 

## 2017-08-12 ENCOUNTER — Encounter: Payer: Self-pay | Admitting: Internal Medicine

## 2017-08-12 ENCOUNTER — Other Ambulatory Visit: Payer: Medicare Other

## 2017-08-13 ENCOUNTER — Telehealth: Payer: Self-pay

## 2017-08-13 DIAGNOSIS — I2510 Atherosclerotic heart disease of native coronary artery without angina pectoris: Secondary | ICD-10-CM | POA: Diagnosis not present

## 2017-08-13 DIAGNOSIS — I517 Cardiomegaly: Secondary | ICD-10-CM | POA: Diagnosis not present

## 2017-08-13 DIAGNOSIS — R0600 Dyspnea, unspecified: Secondary | ICD-10-CM | POA: Diagnosis not present

## 2017-08-13 DIAGNOSIS — R0602 Shortness of breath: Secondary | ICD-10-CM | POA: Diagnosis not present

## 2017-08-13 DIAGNOSIS — Z955 Presence of coronary angioplasty implant and graft: Secondary | ICD-10-CM | POA: Diagnosis not present

## 2017-08-13 DIAGNOSIS — I429 Cardiomyopathy, unspecified: Secondary | ICD-10-CM | POA: Diagnosis not present

## 2017-08-13 DIAGNOSIS — R9439 Abnormal result of other cardiovascular function study: Secondary | ICD-10-CM | POA: Diagnosis not present

## 2017-08-13 DIAGNOSIS — I509 Heart failure, unspecified: Secondary | ICD-10-CM | POA: Diagnosis not present

## 2017-08-13 DIAGNOSIS — I252 Old myocardial infarction: Secondary | ICD-10-CM | POA: Diagnosis not present

## 2017-08-13 NOTE — Telephone Encounter (Signed)
LVM to call back for lab results. Sodium levels for patient was wnl.

## 2017-08-30 DIAGNOSIS — I2510 Atherosclerotic heart disease of native coronary artery without angina pectoris: Secondary | ICD-10-CM | POA: Diagnosis not present

## 2017-08-30 DIAGNOSIS — I429 Cardiomyopathy, unspecified: Secondary | ICD-10-CM | POA: Diagnosis not present

## 2017-09-06 ENCOUNTER — Ambulatory Visit: Payer: Medicare Other | Admitting: Family

## 2017-09-13 ENCOUNTER — Encounter: Payer: Self-pay | Admitting: Family

## 2017-09-13 ENCOUNTER — Ambulatory Visit: Payer: Medicare Other | Attending: Family | Admitting: Family

## 2017-09-13 VITALS — BP 112/63 | HR 80 | Resp 18 | Wt 157.1 lb

## 2017-09-13 DIAGNOSIS — I739 Peripheral vascular disease, unspecified: Secondary | ICD-10-CM | POA: Diagnosis not present

## 2017-09-13 DIAGNOSIS — I2510 Atherosclerotic heart disease of native coronary artery without angina pectoris: Secondary | ICD-10-CM | POA: Insufficient documentation

## 2017-09-13 DIAGNOSIS — Z7982 Long term (current) use of aspirin: Secondary | ICD-10-CM | POA: Insufficient documentation

## 2017-09-13 DIAGNOSIS — Z87891 Personal history of nicotine dependence: Secondary | ICD-10-CM | POA: Insufficient documentation

## 2017-09-13 DIAGNOSIS — I110 Hypertensive heart disease with heart failure: Secondary | ICD-10-CM | POA: Insufficient documentation

## 2017-09-13 DIAGNOSIS — E7800 Pure hypercholesterolemia, unspecified: Secondary | ICD-10-CM | POA: Diagnosis not present

## 2017-09-13 DIAGNOSIS — I5022 Chronic systolic (congestive) heart failure: Secondary | ICD-10-CM | POA: Insufficient documentation

## 2017-09-13 DIAGNOSIS — I10 Essential (primary) hypertension: Secondary | ICD-10-CM

## 2017-09-13 DIAGNOSIS — Z951 Presence of aortocoronary bypass graft: Secondary | ICD-10-CM | POA: Diagnosis not present

## 2017-09-13 DIAGNOSIS — Z79899 Other long term (current) drug therapy: Secondary | ICD-10-CM | POA: Insufficient documentation

## 2017-09-13 DIAGNOSIS — Z888 Allergy status to other drugs, medicaments and biological substances status: Secondary | ICD-10-CM | POA: Diagnosis not present

## 2017-09-13 NOTE — Progress Notes (Signed)
[name], female    DOB: 1950/12/30, 67 y.o.   MRN: [id number]  HPI  Jennifer [name] is a 67 y/o female with a history of CAD, MI, hyperlipidemia, HTN, PVD, previous tobacco use and chronic heart failre.   Echo report from 05/18/17 reviewed and showed an EF of 40-45% along with mild MR.   Admitted 07/14/17 due to acute on chronic HF. Cardiology consulted. IV lasix initially given and then transitioned to oral diuretics. Discharged the following day. Admitted 05/17/17 due to acute HF exacerbation. Cardiology consulted. Given IV lasix initially and then oral diuretics. Elevated troponin thought to be due to demand ischemia. Discharged in 2 days.   She presents today for a follow-up visit with a chief complaint of minimal shortness of breath upon moderate exertion. She says that this has been present for several years and does get worse when the weather's hot/ humid. She has associated fatigue, light-headedness and neck pain along with this. She denies any difficulty sleeping, abdominal distention, palpitations, pedal edema, chest pain, cough or weight gain. Has recently returned from a visit to her daughter's in [place]. Cardiologist has recently resumed spironolactone 25mg  daily and she thinks she will be getting lab work drawn in a few weeks.   Past Medical History:  Diagnosis Date  . CAD (coronary artery disease) 2003   s/p CABG x 5 (Dr Evelina Dun), MI- 2010  . Endometriosis   . Hypercholesteremia   . Hypertension   . PVD (peripheral vascular disease) ([place])    s/p intervention (left) - 2004, right (2006)  . Systolic CHF ([place])    last EF 40%   Past Surgical History:  Procedure Laterality Date  . ABDOMINAL HYSTERECTOMY    . BREAST SURGERY  2004   cyst removed  . cataract surgery  2007   left  . COLONOSCOPY WITH PROPOFOL N/A 10/30/2015   Procedure: COLONOSCOPY WITH PROPOFOL;  Surgeon: Robert Bellow, MD;  Location: [facility] ENDOSCOPY;  Service: Endoscopy;  Laterality: N/A;   . CORONARY ARTERY BYPASS GRAFT     2003   Family History  Problem Relation Age of Onset  . Heart disease Mother   . CVA Mother   . Heart disease Father        CABG  . Heart disease Unknown        aunts and uncles  . Breast cancer Neg Hx   . Colon cancer Neg Hx    Social History   Tobacco Use  . Smoking status: Former Smoker    Last attempt to quit: 09/03/2008    Years since quitting: 9.0  . Smokeless tobacco: Never Used  Substance Use Topics  . Alcohol use: Yes    Alcohol/week: 0.0 standard drinks    Comment: once a year   Allergies  Allergen Reactions  . Cyclobenzaprine Other (See Comments)    Felt bad  . Prednisone Other (See Comments)    Felt bad   Prior to Admission medications   Medication Sig Start Date End Date Taking? Authorizing Provider  ALPRAZolam Duanne Moron) 0.5 MG tablet Take 1/2 - 1 tablet q hs prn. 07/28/17  Yes Einar Pheasant, MD  Ascorbic Acid (VITAMIN C) 1000 MG tablet Take 1,000 mg by mouth 2 (two) times daily.    Yes [provider]  aspirin EC 81 MG tablet Take 81 mg by mouth daily.   Yes [provider]  carvedilol (COREG) 3.125 MG tablet Take 1 tablet (3.125 mg total) by mouth 2 (  two) times daily with a meal. 12/01/11  Yes Einar Pheasant, MD  Cholecalciferol (CVS D3) 2000 units CAPS Take 2,000 Units by mouth 2 (two) times daily.   Yes [provider]  clopidogrel (PLAVIX) 75 MG tablet Take 1 tablet (75 mg total) by mouth daily. 11/04/15  Yes Byrnett, Forest Gleason, MD  Coenzyme Q10 (CO Q 10) 100 MG CAPS Take 100 mg by mouth daily.    Yes [provider]  furosemide (LASIX) 40 MG tablet Take 40 mg by mouth daily.  12/01/11  Yes Einar Pheasant, MD  Garlic 2831 MG CAPS Take 1,000 mg by mouth daily.   Yes [provider]  isosorbide mononitrate (IMDUR) 60 MG 24 hr tablet Take 60-120 mg by mouth 2 (two) times daily. Take 120 mg by mouth in the morning and 60 mg by mouth in the evening.   Yes [provider]   KRILL OIL PO Take 2,000 mg by mouth daily.    Yes [provider]  losartan (COZAAR) 25 MG tablet Take 25 mg by mouth daily.    Yes [provider]  Magnesium 250 MG TABS Take 500 mg by mouth at bedtime.    Yes [provider]  Multiple Vitamin (MULTIVITAMIN WITH MINERALS) TABS tablet Take 1 tablet by mouth 2 (two) times daily.   Yes [provider]  nitroGLYCERIN (NITROSTAT) 0.4 MG SL tablet Place 1 tablet (0.4 mg total) under the tongue every 5 (five) minutes as needed for chest pain. 12/01/11  Yes Einar Pheasant, MD  OIL OF OREGANO PO Take 1,920 mg by mouth daily as needed (SINUS).    Yes [provider]  Red Yeast Rice 600 MG CAPS Take 2 capsules by mouth 2 (two) times daily.   Yes [provider]  Saccharomyces boulardii (PROBIOTIC) 250 MG CAPS Take 1 capsule by mouth daily.   Yes [provider]  Sodium Chloride-Xylitol (XLEAR SINUS CARE SPRAY) SOLN Place 1 spray into the nose daily.   Yes [provider]  spironolactone (ALDACTONE) 25 MG tablet Take 1 tablet (25 mg total) by mouth daily. 08/06/17  Yes Deshae Dickison A, FNP  TURMERIC PO Take 2 tablets by mouth daily.    Yes [provider]  vitamin E (VITAMIN E) 400 UNIT capsule Take 400 Units by mouth every Monday, Wednesday, and Friday.    Yes [provider]    Review of Systems  Constitutional: Positive for fatigue (improving). Negative for appetite change.  HENT: Negative for congestion, postnasal drip and sore throat.   Eyes: Negative.   Respiratory: Positive for shortness of breath (worse with heat/humidity). Negative for cough and chest tightness.   Cardiovascular: Negative for chest pain, palpitations and leg swelling.  Gastrointestinal: Negative for abdominal distention and abdominal pain.  Endocrine: Negative.   Genitourinary: Negative.   Musculoskeletal: Positive for neck pain (due to muscle strain). Negative for back pain.  Skin:  Negative.   Allergic/Immunologic: Negative.   Neurological: Positive for light-headedness (only with heat). Negative for dizziness.  Hematological: Negative for adenopathy. Does not bruise/bleed easily.  Psychiatric/Behavioral: Negative for dysphoric mood and sleep disturbance (sleeping on 1 pillow). The patient is not nervous/anxious.    Vitals:   09/13/17 1143  BP: 112/63  Pulse: 80  Resp: 18  SpO2: 98%  Weight: 157 lb 2 oz (71.3 kg)   Wt Readings from Last 3 Encounters:  09/13/17 157 lb 2 oz (71.3 kg)  08/06/17 160 lb (72.6 kg)  07/28/17 161 lb  4.8 oz (73.2 kg)   Lab Results  Component Value Date   CREATININE 0.89 08/09/2017   CREATININE 0.85 07/28/2017   CREATININE 0.96 07/19/2017   Physical Exam  Constitutional: She is oriented to person, place, and time. She appears well-developed and well-nourished.  HENT:  Head: Normocephalic and atraumatic.  Neck: Normal range of motion. Neck supple. No JVD present.  Cardiovascular: Normal rate and regular rhythm.  Pulmonary/Chest: Effort normal and breath sounds normal. No respiratory distress. She has no wheezes. She has no rales.  Abdominal: Soft. She exhibits no distension.  Musculoskeletal:       Right lower leg: She exhibits no tenderness and no edema.       Left lower leg: She exhibits no tenderness and no edema.  Neurological: She is alert and oriented to person, place, and time.  Skin: Skin is warm and dry.  Psychiatric: She has a normal mood and affect. Her behavior is normal.  Nursing note and vitals reviewed.  Assessment & Plan:  1: Chronic heart failure with reduced ejection fraction- - NYHA class II - euvolemic today - weighing daily and says that her weight has been stable. Reminded to call for an overnight weight gain of >2 pounds or a weekly weight gain of >5 pounds - weight down 3 pounds since she was last here ~ 5 weeks ago - not adding salt to her food and has been reading food labels. Reviewed the  importance of closely following a 2000mg  sodium diet  - spironolactone has been resumed; explained making sure that her potassium level and kidney function were checked in a few weeks - sees cardiology Ubaldo Glassing) as well as cardiologist in [place], [place] Alfonso Patten) - doubt BP could tolerate titrating up carvedilol or losartan at this time - trying to be active with allowing for frequent rest periods - EF >40% so would not qualify for entresto use - BNP 05/17/17 was 586.0  2: HTN- - BP looks good today - saw PCP Nicki Reaper) 07/28/17 - BMP 08/09/17 reviewed and showed sodium 130, potassium 4.2 and GFR 67.25  Patient did not bring her medications nor a list. Each medication was verbally reviewed with the patient and she was encouraged to bring the bottles to every visit to confirm accuracy of list.  Will not make a return appointment at this time. Advised her to call back at any point in the future to make another appointment.

## 2017-09-13 NOTE — Patient Instructions (Signed)
Continue weighing daily and call for an overnight weight gain of > 2 pounds or a weekly weight gain of >5 pounds. 

## 2017-09-14 ENCOUNTER — Ambulatory Visit: Payer: Medicare Other | Admitting: Internal Medicine

## 2017-09-14 NOTE — Progress Notes (Signed)
Called patient. Unable to leave message due to no voicemail set up 

## 2017-09-14 NOTE — Progress Notes (Signed)
Reviewed above information.  Per note, pt needs f/u metabolic panel checked within the next 2-3 weeks.  Need to make sure has scheduled.  (may have scheduled at another MD office).  If not scheduled, please schedule non fasting lab appt here in the next 2-3 weeks.  Also, need to make sure she has a f/u appt scheduled with me over the next few months.

## 2017-09-16 NOTE — Progress Notes (Signed)
LMTCB. Tried both numbers listed

## 2017-09-20 DIAGNOSIS — H401131 Primary open-angle glaucoma, bilateral, mild stage: Secondary | ICD-10-CM | POA: Diagnosis not present

## 2017-09-20 DIAGNOSIS — H40019 Open angle with borderline findings, low risk, unspecified eye: Secondary | ICD-10-CM | POA: Diagnosis not present

## 2017-09-28 ENCOUNTER — Other Ambulatory Visit: Payer: Self-pay | Admitting: Otolaryngology

## 2017-09-28 DIAGNOSIS — R221 Localized swelling, mass and lump, neck: Secondary | ICD-10-CM | POA: Diagnosis not present

## 2017-09-28 DIAGNOSIS — H903 Sensorineural hearing loss, bilateral: Secondary | ICD-10-CM | POA: Diagnosis not present

## 2017-09-28 DIAGNOSIS — H9209 Otalgia, unspecified ear: Secondary | ICD-10-CM

## 2017-09-28 DIAGNOSIS — R519 Headache, unspecified: Secondary | ICD-10-CM

## 2017-09-28 DIAGNOSIS — R51 Headache: Secondary | ICD-10-CM

## 2017-09-29 NOTE — Progress Notes (Signed)
Denisa, I have been unable to reach this patient. Will you make sure she gets f/u labs and schedules a f/u with Dr. Nicki Reaper when she sees you on 10/05/17?

## 2017-10-05 ENCOUNTER — Ambulatory Visit (INDEPENDENT_AMBULATORY_CARE_PROVIDER_SITE_OTHER): Payer: Medicare Other

## 2017-10-05 VITALS — BP 110/64 | HR 65 | Temp 97.8°F | Resp 14 | Ht 66.5 in | Wt 158.4 lb

## 2017-10-05 DIAGNOSIS — Z1159 Encounter for screening for other viral diseases: Secondary | ICD-10-CM

## 2017-10-05 DIAGNOSIS — E871 Hypo-osmolality and hyponatremia: Secondary | ICD-10-CM | POA: Diagnosis not present

## 2017-10-05 DIAGNOSIS — Z0000 Encounter for general adult medical examination without abnormal findings: Secondary | ICD-10-CM | POA: Diagnosis not present

## 2017-10-05 LAB — BASIC METABOLIC PANEL
BUN/Creatinine Ratio: 15 (calc) (ref 6–22)
BUN: 19 mg/dL (ref 7–25)
CO2: 29 mmol/L (ref 20–32)
CREATININE: 1.27 mg/dL — AB (ref 0.50–0.99)
Calcium: 9.7 mg/dL (ref 8.6–10.4)
Chloride: 96 mmol/L — ABNORMAL LOW (ref 98–110)
GLUCOSE: 88 mg/dL (ref 65–99)
Potassium: 4.2 mmol/L (ref 3.5–5.3)
Sodium: 135 mmol/L (ref 135–146)

## 2017-10-05 NOTE — Patient Instructions (Addendum)
  Ms. Cobbs , Thank you for taking time to come for your Medicare Wellness Visit. I appreciate your ongoing commitment to your health goals. Please review the following plan we discussed and let me know if I can assist you in the future.   Schedule follow up with pcp.   Bring a copy of your [place] and/or Living Will to be scanned into chart.  Mychart deactivated today per request to not receive communication this way due to inconsistent transmittance service.   Have a great day!  These are the goals we discussed: Goals    . Waist slimming exercises     Core exercises, reps as demonstrated Routine silver sneaker program       This is a list of the screening recommended for you and due dates:  Health Maintenance  Topic Date Due  . Tetanus Vaccine  05/10/2023  . Colon Cancer Screening  10/29/2025  .  Hepatitis C: One time screening is recommended by Center for Disease Control  (CDC) for  adults born from [birth year] through 1965.   Completed  . Flu Shot  Discontinued  . Mammogram  Discontinued  . DEXA scan (bone density measurement)  Discontinued  . Pneumonia vaccines  Discontinued

## 2017-10-05 NOTE — Progress Notes (Signed)
Subjective:   Jennifer [name] is a 67 y.o. female who presents for an Initial Medicare Annual Wellness Visit.  Review of Systems    No ROS.  Medicare Wellness Visit. Additional risk factors are reflected in the social history.  Cardiac Risk Factors include: advanced age (>59men, >3 women);hypertension     Objective:    Today's Vitals   10/05/17 1142  BP: 110/64  Pulse: 65  Resp: 14  Temp: 97.8 F (36.6 C)  TempSrc: Oral  SpO2: 97%  Weight: 158 lb 6.4 oz (71.8 kg)  Height: 5' 6.5" (1.689 m)   Body mass index is 25.18 kg/m.  Advanced Directives 10/05/2017 07/14/2017 05/18/2017 06/19/2016 12/18/2015 10/30/2015  Does Patient Have a Medical Advance Directive? Yes No Yes Yes Yes Yes  Type of Paramedic of [place];Living will - [place];Living will Living will [place] will  Does patient want to make changes to medical advance directive? No - Patient declined - No - Patient declined - - -  Copy of [place] in Chart? No - copy requested - No - copy requested - - -  Would patient like information on creating a medical advance directive? - No - Patient declined - - - -    Current Medications (verified) Outpatient Encounter Medications as of 10/05/2017  Medication Sig  . ALPRAZolam (XANAX) 0.5 MG tablet Take 1/2 - 1 tablet q hs prn.  . Ascorbic Acid (VITAMIN C) 1000 MG tablet Take 1,000 mg by mouth 2 (two) times daily.   Marland Kitchen aspirin EC 81 MG tablet Take 81 mg by mouth daily.  . carvedilol (COREG) 3.125 MG tablet Take 1 tablet (3.125 mg total) by mouth 2 (two) times daily with a meal.  . Cholecalciferol (CVS D3) 2000 units CAPS Take 2,000 Units by mouth 2 (two) times daily.  . clopidogrel (PLAVIX) 75 MG tablet Take 1 tablet (75 mg total) by mouth daily.  . Coenzyme Q10 (COQ10) 100 MG CAPS Take 1 capsule by mouth 2 (two) times daily.  . furosemide (LASIX) 40 MG tablet Take 40 mg by mouth daily.   .  Garlic 6606 MG CAPS Take 1,000 mg by mouth daily.  . isosorbide mononitrate (IMDUR) 60 MG 24 hr tablet Take 60-120 mg by mouth 2 (two) times daily. Take 120 mg by mouth in the morning and 60 mg by mouth in the evening.  Marland Kitchen KRILL OIL PO Take 2,000 mg by mouth daily.   Marland Kitchen losartan (COZAAR) 25 MG tablet Take 25 mg by mouth daily.   . Magnesium 250 MG TABS Take 500 mg by mouth at bedtime.   . Multiple Vitamin (MULTIVITAMIN WITH MINERALS) TABS tablet Take 1 tablet by mouth 2 (two) times daily.  . nitroGLYCERIN (NITROSTAT) 0.4 MG SL tablet Place 1 tablet (0.4 mg total) under the tongue every 5 (five) minutes as needed for chest pain.  . OIL OF OREGANO PO Take 1,920 mg by mouth daily as needed (SINUS).   . Red Yeast Rice 600 MG CAPS Take 2 capsules by mouth 2 (two) times daily.  . Saccharomyces boulardii (PROBIOTIC) 250 MG CAPS Take 1 capsule by mouth daily.  . Sodium Chloride-Xylitol (XLEAR SINUS CARE SPRAY) SOLN Place 1 spray into the nose daily.  Marland Kitchen spironolactone (ALDACTONE) 25 MG tablet Take 1 tablet (25 mg total) by mouth daily.  . TURMERIC PO Take 2 tablets by mouth daily.   . vitamin E (VITAMIN E) 400 UNIT capsule  Take 400 Units by mouth every Monday, Wednesday, and Friday.   . [DISCONTINUED] Coenzyme Q10 (CO Q 10) 100 MG CAPS Take 100 mg by mouth daily.    No facility-administered encounter medications on file as of 10/05/2017.     Allergies (verified) Cyclobenzaprine and Prednisone   History: Past Medical History:  Diagnosis Date  . CAD (coronary artery disease) 2003   s/p CABG x 5 (Dr Evelina Dun), MI- 2010  . Endometriosis   . Hypercholesteremia   . Hypertension   . PVD (peripheral vascular disease) ([place])    s/p intervention (left) - 2004, right (2006)  . Systolic CHF ([place])    last EF 40%   Past Surgical History:  Procedure Laterality Date  . ABDOMINAL HYSTERECTOMY    . BREAST SURGERY  2004   cyst removed  . cataract surgery  2007   left  . COLONOSCOPY WITH PROPOFOL N/A 10/30/2015    Procedure: COLONOSCOPY WITH PROPOFOL;  Surgeon: Robert Bellow, MD;  Location[facility] ENDOSCOPY;  Service: Endoscopy;  Laterality: N/A;  . CORONARY ARTERY BYPASS GRAFT     2003   Family History  Problem Relation Age of Onset  . Heart disease Mother   . CVA Mother   . Heart disease Father        CABG  . Heart disease Unknown        aunts and uncles  . Diabetes Sister   . Breast cancer Neg Hx   . Colon cancer Neg Hx    Social History   Socioeconomic History  . Marital status: Married    Spouse name: Not on file  . Number of children: Not on file  . Years of education: Not on file  . Highest education level: Not on file  Occupational History  . Not on file  Social Needs  . Financial resource strain: Not hard at all  . Food insecurity:    Worry: Never true    Inability: Never true  . Transportation needs:    Medical: No    Non-medical: No  Tobacco Use  . Smoking status: Former Smoker    Last attempt to quit: 09/03/2008    Years since quitting: 9.0  . Smokeless tobacco: Never Used  Substance and Sexual Activity  . Alcohol use: Yes    Alcohol/week: 0.0 standard drinks    Comment: once a year  . Drug use: No  . Sexual activity: Not on file  Lifestyle  . Physical activity:    Days per week: 3 days    Minutes per session: 60 min  . Stress: Not at all  Relationships  . Social connections:    Talks on phone: Not on file    Gets together: Not on file    Attends religious service: Not on file    Active member of club or organization: Not on file    Attends meetings of clubs or organizations: Not on file    Relationship status: Married  Other Topics Concern  . Not on file  Social History Narrative   Lives at home with husband, independent at baseline    Tobacco Counseling Counseling given: Not Answered   Clinical Intake:  Pre-visit preparation completed: Yes  Pain : No/denies pain     Nutritional Status: BMI 25 -29 Overweight Diabetes: No  How often  do you need to have someone help you when you read instructions, pamphlets, or other written materials from your doctor or pharmacy?: 1 - Never  Interpreter Needed?: No  Activities of Daily Living In your present state of health, do you have any difficulty performing the following activities: 10/05/2017 09/13/2017  Hearing? N N  Vision? N N  Difficulty concentrating or making decisions? N N  Walking or climbing stairs? Y N  Comment SOB with humidity and hot weather. None today.  -  Dressing or bathing? N N  Doing errands, shopping? N N  Preparing Food and eating ? N -  Using the Toilet? N -  In the past six months, have you accidently leaked urine? N -  Do you have problems with loss of bowel control? N -  Managing your Medications? N -  Managing your Finances? N -  Housekeeping or managing your Housekeeping? N -  Some recent data might be hidden     Immunizations and Health Maintenance Immunization History  Administered Date(s) Administered  . Td 05/09/2013   There are no preventive care reminders to display for this patient.  Patient Care Team: Einar Pheasant, MD as PCP - General (Internal Medicine) Bary Castilla Forest Gleason, MD as Consulting Physician (General Surgery) Einar Pheasant, MD as Referring Physician (Internal Medicine)  Indicate any recent Medical Services you may have received from other than [facility] providers in the past year (date may be approximate).     Assessment:   This is a routine wellness examination for Ninfa.  The goal of the wellness visit is to assist the patient how to close the gaps in care and create a preventative care plan for the patient.   Recently given prednisone 10mg  for 6 days for inflammation in the neck and shoulder on 8/27. States she has a habit of over doing it or over extending herself with activity. Felt better, finished medication. Again she over did it and is back where she started with the inflammation. Declines follow up  appointment offered today. Plans to stretch before and after activity, monitor symptoms for improvement/worsening.  Contact pcp if symptoms do not improve or worsen or as needed.   The roster of all physicians providing medical care to patient is listed in the Snapshot section of the chart.  Taking calcium VIT D as appropriate/Osteoporosis risk reviewed.    Safety issues reviewed; Smoke and carbon monoxide detectors in the home. Firearm safety discussed.  Wears seatbelts when driving or riding with others. No violence in the home.  They do not have excessive sun exposure.  Discussed the need for sun protection: hats, long sleeves and the use of sunscreen if there is significant sun exposure.  Patient is alert, normal appearance, oriented to person/place/and time. Correctly identified the president of the [place] and recalls of 3/3 words.Performs simple calculations and can read correct time from watch face. Displays appropriate judgement.  No new identified risk were noted.  No failures at ADL's or IADL's.    BMI- discussed the importance of a healthy diet, water intake and the benefits of aerobic exercise. Educational material provided.   24 hour diet recall: Moderate diet.   Monitors salt intake; salt free seasoning.   Daily fluid intake: verbalized understanding to decrease free water fluid intake by 1/3 via note on 08/09/17.  BMP lab recheck completed today. Next routine scheduled appointment is January 2020.   Dental- every 6 months.  Dr. Toy Cookey.   Eye- Visual acuity not assessed per patient preference since they have regular follow up with the ophthalmologist.  Wears corrective lenses.  Sleep patterns- Sleeps 6-7 hours at night.  Wakes feeling rested.   Hep C  consent given.   Health maintenance gaps- closed.  Patient Concerns:She does not wish to be contacted via mychart as she has little to no reception where she resides and is concerned she will miss communication. Mychart  deactivated today. Please call number on file.   Hearing/Vision screen Hearing Screening Comments: Patient is able to hear conversational tones without difficulty.  No issues reported.   Vision Screening Comments: Followed by Dr. Ellin Mayhew Wears corrective lenses Annual visits Cataract extraction, bilateral Visual acuity not assessed per patient preference since they have regular follow up with the ophthalmologist  Dietary issues and exercise activities discussed: Current Exercise Habits: Home exercise routine, Type of exercise: stretching;strength training/weights;yoga(Silver sneaker aerobics), Time (Minutes): 60, Frequency (Times/Week): 5, Weekly Exercise (Minutes/Week): 300, Intensity: Mild  Goals    . Waist slimming exercises     Core exercises, reps as demonstrated Routine silver sneaker program      Depression Screen PHQ 2/9 Scores 10/05/2017 09/13/2017 08/06/2017 11/24/2016 08/05/2015 05/24/2014 07/09/2012  PHQ - 2 Score 0 0 0 0 0 0 0    Fall Risk Fall Risk  10/05/2017 09/13/2017 08/06/2017 11/24/2016 08/05/2015  Falls in the past year? No No No No No   Cognitive Function: MMSE - Mini Mental State Exam 10/05/2017  Orientation to time 5  Orientation to Place 5  Registration 3  Attention/ Calculation 5  Recall 3  Language- name 2 objects 2  Language- repeat 1  Language- follow 3 step command 3  Language- read & follow direction 1  Write a sentence 1  Copy design 1  Total score 30        Screening Tests Health Maintenance  Topic Date Due  . TETANUS/TDAP  05/10/2023  . COLONOSCOPY  10/29/2025  . Hepatitis C Screening  Completed  . INFLUENZA VACCINE  Discontinued  . MAMMOGRAM  Discontinued  . DEXA SCAN  Discontinued  . PNA vac Low Risk Adult  Discontinued     Plan:   End of life planning; Advance aging; Advanced directives discussed. Copy of current HCPOA/Living Will requested.    I have personally reviewed and noted the following in the patient's chart:   . Medical and  social history . Use of alcohol, tobacco or illicit drugs  . Current medications and supplements . Functional ability and status . Nutritional status . Physical activity . Advanced directives . List of other physicians . Hospitalizations, surgeries, and ER visits in previous 12 months . Vitals . Screenings to include cognitive, depression, and falls . Referrals and appointments  In addition, I have reviewed and discussed with patient certain preventive protocols, quality metrics, and best practice recommendations. A written personalized care plan for preventive services as well as general preventive health recommendations were provided to patient.     Varney Biles, LPN   07/10/6718    Reviewed above information.  Agree with assessment and plan.    Dr Nicki Reaper

## 2017-10-06 ENCOUNTER — Ambulatory Visit: Payer: Medicare Other

## 2017-10-06 LAB — HEPATITIS C ANTIBODY
Hepatitis C Ab: NONREACTIVE
SIGNAL TO CUT-OFF: 0.01 (ref <1.00)

## 2017-10-07 ENCOUNTER — Encounter (INDEPENDENT_AMBULATORY_CARE_PROVIDER_SITE_OTHER): Payer: Medicare Other

## 2017-10-11 ENCOUNTER — Other Ambulatory Visit: Payer: Self-pay | Admitting: Internal Medicine

## 2017-10-11 ENCOUNTER — Telehealth: Payer: Self-pay | Admitting: Radiology

## 2017-10-11 ENCOUNTER — Other Ambulatory Visit (INDEPENDENT_AMBULATORY_CARE_PROVIDER_SITE_OTHER): Payer: Medicare Other

## 2017-10-11 DIAGNOSIS — Z5181 Encounter for therapeutic drug level monitoring: Secondary | ICD-10-CM

## 2017-10-11 LAB — BASIC METABOLIC PANEL
BUN: 12 mg/dL (ref 6–23)
CO2: 28 meq/L (ref 19–32)
Calcium: 10 mg/dL (ref 8.4–10.5)
Chloride: 99 mEq/L (ref 96–112)
Creatinine, Ser: 0.92 mg/dL (ref 0.40–1.20)
GFR: 64.69 mL/min (ref >60.00)
GLUCOSE: 92 mg/dL (ref 70–99)
POTASSIUM: 4.2 meq/L (ref 3.5–5.1)
SODIUM: 136 meq/L (ref 135–145)

## 2017-10-11 NOTE — Telephone Encounter (Signed)
Patient came in for labs today. No orders placed. Per results to check BMP. Please place future orders. Thank you

## 2017-10-11 NOTE — Progress Notes (Signed)
Order placed for f/u lab.   

## 2017-10-11 NOTE — Telephone Encounter (Signed)
Order placed for met b.  Thanks.  

## 2017-10-20 ENCOUNTER — Ambulatory Visit
Admission: RE | Admit: 2017-10-20 | Discharge: 2017-10-20 | Disposition: A | Discharge status: Home / Self Care | Payer: Medicare Other | Source: Ambulatory Visit | Attending: Otolaryngology | Admitting: Otolaryngology

## 2017-10-20 DIAGNOSIS — J432 Centrilobular emphysema: Secondary | ICD-10-CM | POA: Insufficient documentation

## 2017-10-20 DIAGNOSIS — I6523 Occlusion and stenosis of bilateral carotid arteries: Secondary | ICD-10-CM | POA: Insufficient documentation

## 2017-10-20 DIAGNOSIS — I700 Atherosclerosis of aorta: Secondary | ICD-10-CM | POA: Insufficient documentation

## 2017-10-20 DIAGNOSIS — J387 Other diseases of larynx: Secondary | ICD-10-CM | POA: Insufficient documentation

## 2017-10-20 DIAGNOSIS — R221 Localized swelling, mass and lump, neck: Secondary | ICD-10-CM | POA: Diagnosis not present

## 2017-10-20 DIAGNOSIS — R519 Headache, unspecified: Secondary | ICD-10-CM

## 2017-10-20 DIAGNOSIS — R51 Headache: Secondary | ICD-10-CM | POA: Diagnosis not present

## 2017-10-20 DIAGNOSIS — H9202 Otalgia, left ear: Secondary | ICD-10-CM | POA: Diagnosis not present

## 2017-10-20 DIAGNOSIS — H9209 Otalgia, unspecified ear: Secondary | ICD-10-CM

## 2017-10-20 MED ORDER — IOHEXOL 300 MG/ML  SOLN
75.0000 mL | Freq: Once | INTRAMUSCULAR | Status: AC | PRN
  Administered 2017-10-20: 75 mL via INTRAVENOUS

## 2017-10-26 ENCOUNTER — Ambulatory Visit (INDEPENDENT_AMBULATORY_CARE_PROVIDER_SITE_OTHER): Payer: Medicare Other | Admitting: Vascular Surgery

## 2017-10-26 ENCOUNTER — Ambulatory Visit (INDEPENDENT_AMBULATORY_CARE_PROVIDER_SITE_OTHER): Payer: Medicare Other

## 2017-10-26 ENCOUNTER — Encounter (INDEPENDENT_AMBULATORY_CARE_PROVIDER_SITE_OTHER): Payer: Self-pay | Admitting: Vascular Surgery

## 2017-10-26 VITALS — BP 130/69 | HR 77 | Resp 16 | Ht 66.5 in | Wt 158.8 lb

## 2017-10-26 DIAGNOSIS — I6523 Occlusion and stenosis of bilateral carotid arteries: Secondary | ICD-10-CM

## 2017-10-26 DIAGNOSIS — I70213 Atherosclerosis of native arteries of extremities with intermittent claudication, bilateral legs: Secondary | ICD-10-CM

## 2017-10-26 DIAGNOSIS — I10 Essential (primary) hypertension: Secondary | ICD-10-CM | POA: Diagnosis not present

## 2017-10-26 DIAGNOSIS — E7800 Pure hypercholesterolemia, unspecified: Secondary | ICD-10-CM | POA: Diagnosis not present

## 2017-10-26 DIAGNOSIS — Z951 Presence of aortocoronary bypass graft: Secondary | ICD-10-CM

## 2017-10-26 DIAGNOSIS — I739 Peripheral vascular disease, unspecified: Secondary | ICD-10-CM

## 2017-10-26 NOTE — Assessment & Plan Note (Signed)
The patient has 60 to 79% carotid artery stenosis bilaterally.  On the left, this is in the lower end of the range by duplex criteria but on the right, this is in the upper end of the range by duplex criteria.  We will need to continue to follow this closely and I will plan to see her back in 6 months.  Although this is still slightly below the threshold for prophylactic repair, at least on the right it appears to be getting close.  Continue dual antiplatelet therapy and her current medical regimen.

## 2017-10-26 NOTE — Progress Notes (Signed)
MRN : [id number]  Jennifer [name] is a 67 y.o. (1950/10/18) female who presents with chief complaint of  Chief Complaint  Patient presents with  . Follow-up    63month carotid and abi ultrasound  .  History of Present Illness: Patient returns in follow-up of multiple vascular issues.  Her claudication symptoms have progressed.  We have been monitoring her claudication she has been stable for the last couple of years, but at this point she finds her claudication symptoms to be disabling.  Her right leg is the more severely affected of the 2 legs.  She has not been able to do her exercise program and maintain the activity level that she would like.  Her noninvasive studies today show a right ABI of 0.63 and a left ABI of 0.54 with monophasic waveforms. She denies any focal neurologic symptoms.  Specifically, the patient denies amaurosis fugax, speech or swallowing difficulties, or arm or leg weakness or numbness. The patient has 60 to 79% carotid artery stenosis bilaterally.    Current Outpatient Medications  Medication Sig Dispense Refill  . ALPRAZolam (XANAX) 0.5 MG tablet Take 1/2 - 1 tablet q hs prn. 90 tablet 0  . Ascorbic Acid (VITAMIN C) 1000 MG tablet Take 1,000 mg by mouth 2 (two) times daily.     Marland Kitchen aspirin EC 81 MG tablet Take 81 mg by mouth daily.    . carvedilol (COREG) 3.125 MG tablet Take 1 tablet (3.125 mg total) by mouth 2 (two) times daily with a meal. 90 tablet 1  . Cholecalciferol (CVS D3) 2000 units CAPS Take 2,000 Units by mouth 2 (two) times daily.    . clopidogrel (PLAVIX) 75 MG tablet Take 1 tablet (75 mg total) by mouth daily. 90 tablet 3  . Coenzyme Q10 (COQ10) 100 MG CAPS Take 1 capsule by mouth 2 (two) times daily.    . furosemide (LASIX) 40 MG tablet Take 40 mg by mouth daily.     . Garlic 7517 MG CAPS Take 1,000 mg by mouth daily.    . isosorbide mononitrate (IMDUR) 60 MG 24 hr tablet Take 60-120 mg by mouth 2 (two) times daily. Take 120 mg by mouth in the morning  and 60 mg by mouth in the evening.    Marland Kitchen KRILL OIL PO Take 2,000 mg by mouth daily.     Marland Kitchen losartan (COZAAR) 25 MG tablet Take 25 mg by mouth daily.     . Magnesium 250 MG TABS Take 500 mg by mouth at bedtime.     . Multiple Vitamin (MULTIVITAMIN WITH MINERALS) TABS tablet Take 1 tablet by mouth 2 (two) times daily.    . nitroGLYCERIN (NITROSTAT) 0.4 MG SL tablet Place 1 tablet (0.4 mg total) under the tongue every 5 (five) minutes as needed for chest pain. 50 tablet 3  . OIL OF OREGANO PO Take 1,920 mg by mouth daily as needed (SINUS).     . Red Yeast Rice 600 MG CAPS Take 2 capsules by mouth 2 (two) times daily.    . Saccharomyces boulardii (PROBIOTIC) 250 MG CAPS Take 1 capsule by mouth daily.    . Sodium Chloride-Xylitol (XLEAR SINUS CARE SPRAY) SOLN Place 1 spray into the nose daily.    Marland Kitchen spironolactone (ALDACTONE) 25 MG tablet Take 1 tablet (25 mg total) by mouth daily. (Patient taking differently: Take 12.5 mg by mouth daily. ) 30 tablet 6  . TURMERIC PO Take 2 tablets by mouth daily.     Marland Kitchen  vitamin E (VITAMIN E) 400 UNIT capsule Take 400 Units by mouth every Monday, Wednesday, and Friday.      No current facility-administered medications for this visit.     Past Medical History:  Diagnosis Date  . CAD (coronary artery disease) 2003   s/p CABG x 5 (Dr Evelina Dun), [place]- 2010  . Endometriosis   . Hypercholesteremia   . Hypertension   . PVD (peripheral vascular disease) ([place])    s/p intervention (left) - 2004, right (2006)  . Systolic CHF ([place])    last EF 40%    Past Surgical History:  Procedure Laterality Date  . ABDOMINAL HYSTERECTOMY    . BREAST SURGERY  2004   cyst removed  . cataract surgery  2007   left  . COLONOSCOPY WITH PROPOFOL N/A 10/30/2015   Procedure: COLONOSCOPY WITH PROPOFOL;  Surgeon: Robert Bellow, MD;  Location: [facility] ENDOSCOPY;  Service: Endoscopy;  Laterality: N/A;  . CORONARY ARTERY BYPASS GRAFT     2003    Social History  Substance Use Topics  .  Smoking status: Former Smoker    Quit date: 09/03/2008  . Smokeless tobacco: Never Used  . Alcohol use 0.0 oz/week      Comment: once a year     Family History      Family History  Problem Relation Age of Onset  . Heart disease Mother   . CVA Mother   . Heart disease Father    CABG  . Heart disease Unknown    aunts and uncles  . Breast cancer Neg Hx   . Colon cancer Neg Hx           Allergies  Allergen Reactions  . Cyclobenzaprine Other (See Comments)    Felt bad  . Prednisone Other (See Comments)    Felt bad     REVIEW OF SYSTEMS(Negative unless checked)  Constitutional: [] Weight loss[] Fever[] Chills Cardiac:[] Chest pain[] Chest pressure[x] Palpitations [] Shortness of breath when laying flat [] Shortness of breath at rest [x] Shortness of breath with exertion. Vascular: [x] Pain in legs with walking[] Pain in legsat rest[] Pain in legs when laying flat [x] Claudication [] Pain in feet when walking [] Pain in feet at rest [] Pain in feet when laying flat [] History of DVT [] Phlebitis [] Swelling in legs [] Varicose veins [] Non-healing ulcers Pulmonary: [] Uses home oxygen [] Productive cough[] Hemoptysis [] Wheeze [] COPD [] Asthma Neurologic: [] Dizziness [] Blackouts [] Seizures [] History of stroke [] History of TIA[] Aphasia [] Temporary blindness[] Dysphagia [] Weaknessor numbness in arms [] Weakness or numbnessin legs Musculoskeletal: [x] Arthritis [] Joint swelling [] Joint pain [] Low back pain Hematologic:[] Easy bruising[] Easy bleeding [] Hypercoagulable state [] Anemic  Gastrointestinal:[] Blood in stool[] Vomiting blood[] Gastroesophageal reflux/heartburn[] Abdominal pain Genitourinary: [] Chronic kidney disease [] Difficulturination [] Frequenturination [] Burning with urination[] Hematuria Skin: [] Rashes [] Ulcers [] Wounds Psychological: [] History  of anxiety[] History of major depression.     Physical Examination  Vitals:   10/26/17 1511  BP: 130/69  Pulse: 77  Resp: 16  Weight: 158 lb 13.6 oz (72.1 kg)  Height: 5' 6.5" (1.689 m)   Body mass index is 25.25 kg/m. Gen:  WD/WN, NAD Head: [place]/AT, No temporalis wasting. Ear/Nose/Throat: Hearing grossly intact, nares w/o erythema or drainage, trachea midline Eyes: Conjunctiva clear. Sclera non-icteric Neck: Supple.  Bilateral carotid bruits Pulmonary:  Good air movement, equal and clear to auscultation bilaterally.  Cardiac: RRR, No JVD Vascular:  Vessel Right Left  Radial Palpable Palpable                          PT  1+ palpable  trace palpable  DP  not palpable  1+ palpable  Musculoskeletal: M/S 5/5 throughout.  No deformity or atrophy.  No edema. Neurologic: CN 2-12 intact. Sensation grossly intact in extremities.  Symmetrical.  Speech is fluent. Motor exam as listed above. Psychiatric: Judgment intact, Mood & affect appropriate for pt's clinical situation. Dermatologic: No rashes or ulcers noted.  No cellulitis or open wounds.      CBC Lab Results  Component Value Date   WBC 7.2 07/15/2017   HGB 12.8 07/15/2017   HCT 37.2 07/15/2017   MCV 91.1 07/15/2017   PLT 210 07/15/2017    BMET    Component Value Date/Time   NA 136 10/11/2017 1426   NA 133 (L) 02/06/2013 0506   K 4.2 10/11/2017 1426   K 3.8 02/06/2013 0506   CL 99 10/11/2017 1426   CL 102 02/06/2013 0506   CO2 28 10/11/2017 1426   CO2 25 02/06/2013 0506   GLUCOSE 92 10/11/2017 1426   GLUCOSE 135 (H) 02/06/2013 0506   BUN 12 10/11/2017 1426   BUN 26 (H) 02/06/2013 0506   CREATININE 0.92 10/11/2017 1426   CREATININE 1.27 (H) 10/05/2017 1302   CALCIUM 10.0 10/11/2017 1426   CALCIUM 10.0 02/06/2013 0506   GFRNONAA >60 07/15/2017 0511   GFRNONAA 50 (L) 02/06/2013 0506   GFRAA >60 07/15/2017 0511   GFRAA 58 (L) 02/06/2013 0506   Estimated Creatinine Clearance: 56.7 mL/min (by  C-G formula based on SCr of 0.92 mg/dL).  COAG Lab Results  Component Value Date   INR 1.00 05/18/2017    Radiology Ct Soft Tissue Neck W Contrast  Result Date: 10/20/2017 CLINICAL DATA:  67 year old female with intermittent of left ear pain and neck pain radiating to the shoulder with no known injury. Neck swelling. EXAM: CT NECK WITH CONTRAST TECHNIQUE: Multidetector CT imaging of the neck was performed using the standard protocol following the bolus administration of intravenous contrast. CONTRAST:  45mL OMNIPAQUE IOHEXOL 300 MG/ML  SOLN COMPARISON:  None. FINDINGS: Pharynx and larynx: The glottis is closed. Glottic and subglottic soft tissue contours are within normal limits. Supraglottic larynx remarkable for a small 4-5 millimeter circumscribed intermediate density mass in the vallecula inseparable from the anterior surface of the epiglottis (series 2, image 48 and sagittal image 42). Otherwise normal epiglottis and AE folds appear within normal limits. Otherwise negative hypopharynx. Mild postinflammatory calcifications of the palatine tonsils. Negative nasopharynx and oropharynx soft tissue contours. Negative parapharyngeal and retropharyngeal spaces. Salivary glands: Negative sublingual space. Negative submandibular glands aside from a punctate calcification along the anterior margin of that on the left. No submandibular ductal enlargement identified. Similar mild sialolithiasis in the left parotid gland. Negative right parotid gland. Thyroid: Negative. Lymph nodes: Negative. No lymphadenopathy. Vascular: The major vascular structures in the neck and at the skull base are patent. But there is severe bilateral carotid atherosclerosis. Bulky calcified plaque at both ICA origins appears result in high-grade bilateral proximal ICA stenosis (on the left series 2, image 48 resembling a radiographic string sign). Severe calcified plaque in the dominant distal left vertebral artery also appears  hemodynamically significant. Limited intracranial: Negative. Visualized orbits: Postoperative changes to both globes, otherwise negative. Mastoids and visualized paranasal sinuses: Clear. Skeleton: No acute osseous abnormality identified. Prior sternotomy. Mildly elongated bilateral stylohyoid ligament calcification. Upper chest: Calcified aortic atherosclerosis. No superior mediastinal lymphadenopathy. Centrilobular emphysema. Negative visible axillary lymph nodes. IMPRESSION: 1. Small 4-5 mm indeterminant mass in the vallecula along the anterior surface of the epiglottis. See sagittal image 42 and sagittal image 42. Recommend direct visualization  to evaluate further. No other neck mass, lymphadenopathy. 2. Severe bilateral carotid atherosclerosis and high-grade bilateral ICA origin stenosis. 3. Aortic Atherosclerosis (ICD10-I70.0) and Emphysema (ICD10-J43.9). Electronically Signed   By: Genevie Ann M.D.   On: 10/20/2017 17:06     Assessment/Plan Hypertension blood pressure control important in reducing the progression of atherosclerotic disease. On appropriate oral medications.   Hypercholesteremia lipid control important in reducing the progression of atherosclerotic disease. Continue statin therapy   CAD (coronary artery disease) Stable, s/p CABG. Says her last EF was about 35%  Atherosclerosis of native arteries of extremity with intermittent claudication ([place]) Her noninvasive studies today show a right ABI of 0.63 and a left ABI of 0.54 with monophasic waveforms. Is having progression of her claudication symptoms and has now become disabling.  She has maintained appropriate medical therapy with dual antiplatelet therapy now for many years.  She would like to proceed with angiography and possible intervention.  We have discussed the risks and benefits the procedures.  She would like to have the right leg done first as this is the more severely affected of the 2 legs and that is certainly  reasonable.  We have discussed that she does not have any immediate limb threatening symptoms, and that sometimes intervention can actually worsen the situation.  She has a good knowledge of PAD and is dealt with this for some time and understands and desires to proceed with angiography.  Carotid stenosis The patient has 60 to 79% carotid artery stenosis bilaterally.  On the left, this is in the lower end of the range by duplex criteria but on the right, this is in the upper end of the range by duplex criteria.  We will need to continue to follow this closely and I will plan to see her back in 6 months.  Although this is still slightly below the threshold for prophylactic repair, at least on the right it appears to be getting close.  Continue dual antiplatelet therapy and her current medical regimen.    Leotis Pain, MD  10/26/2017 4:18 PM    This note was created with Dragon medical transcription system.  Any errors from dictation are purely unintentional

## 2017-10-26 NOTE — Patient Instructions (Signed)
Peripheral Vascular Disease Peripheral vascular disease (PVD) is a disease of the blood vessels that are not part of your heart and brain. A simple term for PVD is poor circulation. In most cases, PVD narrows the blood vessels that carry blood from your heart to the rest of your body. This can result in a decreased supply of blood to your arms, legs, and internal organs, like your stomach or kidneys. However, it most often affects a person's lower legs and feet. There are two types of PVD.  Organic PVD. This is the more common type. It is caused by damage to the structure of blood vessels.  Functional PVD. This is caused by conditions that make blood vessels contract and tighten (spasm).  Without treatment, PVD tends to get worse over time. PVD can also lead to acute ischemic limb. This is when an arm or limb suddenly has trouble getting enough blood. This is a medical emergency. What are the causes? Each type of PVD has many different causes. The most common cause of PVD is buildup of a fatty material (plaque) inside of your arteries (atherosclerosis). Small amounts of plaque can break off from the walls of the blood vessels and become lodged in a smaller artery. This blocks blood flow and can cause acute ischemic limb. Other common causes of PVD include:  Blood clots that form inside of blood vessels.  Injuries to blood vessels.  Diseases that cause inflammation of blood vessels or cause blood vessel spasms.  Health behaviors and health history that increase your risk of developing PVD.  What increases the risk? You may have a greater risk of PVD if you:  Have a family history of PVD.  Have certain medical conditions, including: ? High cholesterol. ? Diabetes. ? High blood pressure (hypertension). ? Coronary heart disease. ? Past problems with blood clots. ? Past injury, such as burns or a broken bone. These may have damaged blood vessels in your limbs. ? Buerger disease. This is  caused by inflamed blood vessels in your hands and feet. ? Some forms of arthritis. ? Rare birth defects that affect the arteries in your legs.  Use tobacco.  Do not get enough exercise.  Are obese.  Are age 50 or older.  What are the signs or symptoms? PVD may cause many different symptoms. Your symptoms depend on what part of your body is not getting enough blood. Some common signs and symptoms include:  Cramps in your lower legs. This may be a symptom of poor leg circulation (claudication).  Pain and weakness in your legs while you are physically active that goes away when you rest (intermittent claudication).  Leg pain when at rest.  Leg numbness, tingling, or weakness.  Coldness in a leg or foot, especially when compared with the other leg.  Skin or hair changes. These can include: ? Hair loss. ? Shiny skin. ? Pale or bluish skin. ? Thick toenails.  Inability to get or maintain an erection (erectile dysfunction).  People with PVD are more prone to developing ulcers and sores on their toes, feet, or legs. These may take longer than normal to heal. How is this diagnosed? Your health care provider may diagnose PVD from your signs and symptoms. The health care provider will also do a physical exam. You may have tests to find out what is causing your PVD and determine its severity. Tests may include:  Blood pressure recordings from your arms and legs and measurements of the strength of your pulses (  pulse volume recordings).  Imaging studies using sound waves to take pictures of the blood flow through your blood vessels (Doppler ultrasound).  Injecting a dye into your blood vessels before having imaging studies using: ? X-rays (angiogram or arteriogram). ? Computer-generated X-rays (CT angiogram). ? A powerful electromagnetic field and a computer (magnetic resonance angiogram or MRA).  How is this treated? Treatment for PVD depends on the cause of your condition and the  severity of your symptoms. It also depends on your age. Underlying causes need to be treated and controlled. These include long-lasting (chronic) conditions, such as diabetes, high cholesterol, and high blood pressure. You may need to first try making lifestyle changes and taking medicines. Surgery may be needed if these do not work. Lifestyle changes may include:  Quitting smoking.  Exercising regularly.  Following a low-fat, low-cholesterol diet.  Medicines may include:  Blood thinners to prevent blood clots.  Medicines to improve blood flow.  Medicines to improve your blood cholesterol levels.  Surgical procedures may include:  A procedure that uses an inflated balloon to open a blocked artery and improve blood flow (angioplasty).  A procedure to put in a tube (stent) to keep a blocked artery open (stent implant).  Surgery to reroute blood flow around a blocked artery (peripheral bypass surgery).  Surgery to remove dead tissue from an infected wound on the affected limb.  Amputation. This is surgical removal of the affected limb. This may be necessary in cases of acute ischemic limb that are not improved through medical or surgical treatments.  Follow these instructions at home:  Take medicines only as directed by your health care provider.  Do not use any tobacco products, including cigarettes, chewing tobacco, or electronic cigarettes. If you need help quitting, ask your health care provider.  Lose weight if you are overweight, and maintain a healthy weight as directed by your health care provider.  Eat a diet that is low in fat and cholesterol. If you need help, ask your health care provider.  Exercise regularly. Ask your health care provider to suggest some good activities for you.  Use compression stockings or other mechanical devices as directed by your health care provider.  Take good care of your feet. ? Wear comfortable shoes that fit well. ? Check your feet  often for any cuts or sores. Contact a health care provider if:  You have cramps in your legs while walking.  You have leg pain when you are at rest.  You have coldness in a leg or foot.  Your skin changes.  You have erectile dysfunction.  You have cuts or sores on your feet that are not healing. Get help right away if:  Your arm or leg turns cold and blue.  Your arms or legs become red, warm, swollen, painful, or numb.  You have chest pain or trouble breathing.  You suddenly have weakness in your face, arm, or leg.  You become very confused or lose the ability to speak.  You suddenly have a very bad headache or lose your vision. This information is not intended to replace advice given to you by your health care provider. Make sure you discuss any questions you have with your health care provider. Document Released: 02/27/2004 Document Revised: 06/27/2015 Document Reviewed: 06/29/2013 Elsevier Interactive Patient Education  2017 [employer]  

## 2017-10-26 NOTE — Assessment & Plan Note (Signed)
Her noninvasive studies today show a right ABI of 0.63 and a left ABI of 0.54 with monophasic waveforms. Is having progression of her claudication symptoms and has now become disabling.  She has maintained appropriate medical therapy with dual antiplatelet therapy now for many years.  She would like to proceed with angiography and possible intervention.  We have discussed the risks and benefits the procedures.  She would like to have the right leg done first as this is the more severely affected of the 2 legs and that is certainly reasonable.  We have discussed that she does not have any immediate limb threatening symptoms, and that sometimes intervention can actually worsen the situation.  She has a good knowledge of PAD and is dealt with this for some time and understands and desires to proceed with angiography.

## 2017-10-28 ENCOUNTER — Encounter (INDEPENDENT_AMBULATORY_CARE_PROVIDER_SITE_OTHER): Payer: Self-pay

## 2017-11-09 ENCOUNTER — Other Ambulatory Visit (INDEPENDENT_AMBULATORY_CARE_PROVIDER_SITE_OTHER): Payer: Self-pay | Admitting: Vascular Surgery

## 2017-11-12 ENCOUNTER — Other Ambulatory Visit: Payer: Medicare Other

## 2017-11-19 ENCOUNTER — Encounter
Admission: RE | Admit: 2017-11-19 | Discharge: 2017-11-19 | Disposition: A | Discharge status: Home / Self Care | Payer: Medicare Other | Source: Ambulatory Visit | Attending: Vascular Surgery | Admitting: Vascular Surgery

## 2017-11-19 ENCOUNTER — Other Ambulatory Visit: Payer: Self-pay

## 2017-11-19 DIAGNOSIS — Z01812 Encounter for preprocedural laboratory examination: Secondary | ICD-10-CM

## 2017-11-19 DIAGNOSIS — Z87891 Personal history of nicotine dependence: Secondary | ICD-10-CM | POA: Diagnosis not present

## 2017-11-19 DIAGNOSIS — I2510 Atherosclerotic heart disease of native coronary artery without angina pectoris: Secondary | ICD-10-CM | POA: Diagnosis not present

## 2017-11-19 DIAGNOSIS — I10 Essential (primary) hypertension: Secondary | ICD-10-CM | POA: Diagnosis not present

## 2017-11-19 DIAGNOSIS — I714 Abdominal aortic aneurysm, without rupture: Secondary | ICD-10-CM | POA: Diagnosis not present

## 2017-11-19 DIAGNOSIS — I70211 Atherosclerosis of native arteries of extremities with intermittent claudication, right leg: Secondary | ICD-10-CM | POA: Diagnosis not present

## 2017-11-19 DIAGNOSIS — E7800 Pure hypercholesterolemia, unspecified: Secondary | ICD-10-CM | POA: Diagnosis not present

## 2017-11-19 DIAGNOSIS — Z7982 Long term (current) use of aspirin: Secondary | ICD-10-CM | POA: Diagnosis not present

## 2017-11-19 DIAGNOSIS — Z951 Presence of aortocoronary bypass graft: Secondary | ICD-10-CM | POA: Diagnosis not present

## 2017-11-19 DIAGNOSIS — I6523 Occlusion and stenosis of bilateral carotid arteries: Secondary | ICD-10-CM | POA: Diagnosis not present

## 2017-11-19 HISTORY — DX: Headache: R51

## 2017-11-19 HISTORY — DX: Headache, unspecified: R51.9

## 2017-11-19 HISTORY — DX: Dyspnea, unspecified: R06.00

## 2017-11-19 LAB — CREATININE, SERUM: CREATININE: 0.89 mg/dL (ref 0.44–1.00)

## 2017-11-19 LAB — BUN: BUN: 14 mg/dL (ref 8–23)

## 2017-11-21 MED ORDER — CEFAZOLIN SODIUM-DEXTROSE 2-4 GM/100ML-% IV SOLN
2.0000 g | Freq: Once | INTRAVENOUS | Status: AC
  Administered 2017-11-22: 2 g via INTRAVENOUS

## 2017-11-22 ENCOUNTER — Encounter
Admission: RE | Disposition: A | Discharge status: Home / Self Care | Payer: Self-pay | Source: Ambulatory Visit | Attending: Vascular Surgery

## 2017-11-22 ENCOUNTER — Encounter: Payer: Self-pay | Admitting: *Deleted

## 2017-11-22 ENCOUNTER — Ambulatory Visit
Admission: RE | Admit: 2017-11-22 | Discharge: 2017-11-22 | Disposition: A | Discharge status: Home / Self Care | Payer: Medicare Other | Source: Ambulatory Visit | Attending: Vascular Surgery | Admitting: Vascular Surgery

## 2017-11-22 DIAGNOSIS — Z7982 Long term (current) use of aspirin: Secondary | ICD-10-CM | POA: Insufficient documentation

## 2017-11-22 DIAGNOSIS — I70211 Atherosclerosis of native arteries of extremities with intermittent claudication, right leg: Secondary | ICD-10-CM | POA: Diagnosis not present

## 2017-11-22 DIAGNOSIS — I10 Essential (primary) hypertension: Secondary | ICD-10-CM | POA: Diagnosis not present

## 2017-11-22 DIAGNOSIS — Z951 Presence of aortocoronary bypass graft: Secondary | ICD-10-CM | POA: Insufficient documentation

## 2017-11-22 DIAGNOSIS — I714 Abdominal aortic aneurysm, without rupture: Secondary | ICD-10-CM | POA: Insufficient documentation

## 2017-11-22 DIAGNOSIS — Z87891 Personal history of nicotine dependence: Secondary | ICD-10-CM | POA: Insufficient documentation

## 2017-11-22 DIAGNOSIS — I2510 Atherosclerotic heart disease of native coronary artery without angina pectoris: Secondary | ICD-10-CM | POA: Diagnosis not present

## 2017-11-22 DIAGNOSIS — E7800 Pure hypercholesterolemia, unspecified: Secondary | ICD-10-CM | POA: Diagnosis not present

## 2017-11-22 DIAGNOSIS — I70219 Atherosclerosis of native arteries of extremities with intermittent claudication, unspecified extremity: Secondary | ICD-10-CM

## 2017-11-22 DIAGNOSIS — I6523 Occlusion and stenosis of bilateral carotid arteries: Secondary | ICD-10-CM | POA: Insufficient documentation

## 2017-11-22 DIAGNOSIS — I739 Peripheral vascular disease, unspecified: Secondary | ICD-10-CM | POA: Diagnosis not present

## 2017-11-22 HISTORY — PX: LOWER EXTREMITY ANGIOGRAPHY: CATH118251

## 2017-11-22 SURGERY — LOWER EXTREMITY ANGIOGRAPHY
Anesthesia: Moderate Sedation | Laterality: Right

## 2017-11-22 MED ORDER — LABETALOL HCL 5 MG/ML IV SOLN: 10.0000 mg | INTRAVENOUS | Status: DC | PRN

## 2017-11-22 MED ORDER — HYDRALAZINE HCL 20 MG/ML IJ SOLN: 5.0000 mg | INTRAMUSCULAR | Status: DC | PRN

## 2017-11-22 MED ORDER — HEPARIN SODIUM (PORCINE) 1000 UNIT/ML IJ SOLN
INTRAMUSCULAR | Status: DC | PRN
  Administered 2017-11-22: 5000 [IU] via INTRAVENOUS

## 2017-11-22 MED ORDER — ONDANSETRON HCL 4 MG/2ML IJ SOLN: 4.0000 mg | Freq: Four times a day (QID) | INTRAMUSCULAR | Status: DC | PRN

## 2017-11-22 MED ORDER — SODIUM CHLORIDE 0.9% FLUSH: 3.0000 mL | Freq: Two times a day (BID) | INTRAVENOUS | Status: DC

## 2017-11-22 MED ORDER — IOPAMIDOL (ISOVUE-300) INJECTION 61%
INTRAVENOUS | Status: DC | PRN
  Administered 2017-11-22: 65 mL via INTRA_ARTERIAL

## 2017-11-22 MED ORDER — FENTANYL CITRATE (PF) 100 MCG/2ML IJ SOLN
INTRAMUSCULAR | Status: DC | PRN
  Administered 2017-11-22: 50 ug via INTRAVENOUS
  Administered 2017-11-22: 25 ug via INTRAVENOUS

## 2017-11-22 MED ORDER — ACETAMINOPHEN 325 MG PO TABS: 650.0000 mg | ORAL_TABLET | ORAL | Status: DC | PRN

## 2017-11-22 MED ORDER — HYDROMORPHONE HCL 1 MG/ML IJ SOLN: 1.0000 mg | Freq: Once | INTRAMUSCULAR | Status: DC | PRN

## 2017-11-22 MED ORDER — FENTANYL CITRATE (PF) 100 MCG/2ML IJ SOLN
INTRAMUSCULAR | Status: AC
  Filled 2017-11-22: qty 2

## 2017-11-22 MED ORDER — FAMOTIDINE 20 MG PO TABS: 40.0000 mg | ORAL_TABLET | ORAL | Status: DC | PRN

## 2017-11-22 MED ORDER — METHYLPREDNISOLONE SODIUM SUCC 125 MG IJ SOLR: 125.0000 mg | INTRAMUSCULAR | Status: DC | PRN

## 2017-11-22 MED ORDER — SODIUM CHLORIDE 0.9 % IV SOLN
INTRAVENOUS | Status: DC
  Administered 2017-11-22: 08:00:00 via INTRAVENOUS

## 2017-11-22 MED ORDER — SODIUM CHLORIDE 0.9 % IV SOLN: INTRAVENOUS | Status: DC

## 2017-11-22 MED ORDER — HEPARIN (PORCINE) IN NACL 1000-0.9 UT/500ML-% IV SOLN
INTRAVENOUS | Status: AC
  Filled 2017-11-22: qty 1000

## 2017-11-22 MED ORDER — LIDOCAINE-EPINEPHRINE (PF) 1 %-1:200000 IJ SOLN
INTRAMUSCULAR | Status: AC
  Filled 2017-11-22: qty 30

## 2017-11-22 MED ORDER — SODIUM CHLORIDE 0.9% FLUSH: 3.0000 mL | INTRAVENOUS | Status: DC | PRN

## 2017-11-22 MED ORDER — HEPARIN SODIUM (PORCINE) 1000 UNIT/ML IJ SOLN
INTRAMUSCULAR | Status: AC
  Filled 2017-11-22: qty 1

## 2017-11-22 MED ORDER — SODIUM CHLORIDE 0.9 % IV SOLN: 250.0000 mL | INTRAVENOUS | Status: DC | PRN

## 2017-11-22 MED ORDER — ATORVASTATIN CALCIUM 10 MG PO TABS
10.0000 mg | ORAL_TABLET | Freq: Every day | ORAL | Status: DC
  Filled 2017-11-22: qty 1

## 2017-11-22 MED ORDER — SODIUM CHLORIDE FLUSH 0.9 % IV SOLN
INTRAVENOUS | Status: AC
  Filled 2017-11-22: qty 50

## 2017-11-22 MED ORDER — MIDAZOLAM HCL 5 MG/5ML IJ SOLN
INTRAMUSCULAR | Status: AC
  Filled 2017-11-22: qty 5

## 2017-11-22 MED ORDER — MIDAZOLAM HCL 2 MG/2ML IJ SOLN
INTRAMUSCULAR | Status: DC | PRN
  Administered 2017-11-22: 2 mg via INTRAVENOUS
  Administered 2017-11-22: 1 mg via INTRAVENOUS

## 2017-11-22 MED ORDER — ATORVASTATIN CALCIUM 10 MG PO TABS: 10.0000 mg | ORAL_TABLET | Freq: Every day | ORAL | 11 refills | Status: DC

## 2017-11-22 SURGICAL SUPPLY — 17 items
BALLN LUTONIX 018 4X150X130 (BALLOONS) ×3
BALLN LUTONIX 5X220X130 (BALLOONS) ×6
BALLOON LUTONIX 018 4X150X130 (BALLOONS) ×1 IMPLANT
BALLOON LUTONIX 5X220X130 (BALLOONS) ×2 IMPLANT
CATH BEACON 5 .038 100 VERT TP (CATHETERS) ×3 IMPLANT
CATH PIG 70CM (CATHETERS) ×3 IMPLANT
COVER PROBE U/S 5X48 (MISCELLANEOUS) ×3 IMPLANT
DEVICE PRESTO INFLATION (MISCELLANEOUS) ×3 IMPLANT
DEVICE STARCLOSE SE CLOSURE (Vascular Products) ×3 IMPLANT
GLIDEWIRE ADV .035X260CM (WIRE) ×3 IMPLANT
PACK ANGIOGRAPHY (CUSTOM PROCEDURE TRAY) ×3 IMPLANT
SHEATH ANL2 6FRX45 HC (SHEATH) ×3 IMPLANT
SHEATH BRITE TIP 5FRX11 (SHEATH) ×3 IMPLANT
STENT VIABAHN 6X250X120 (Permanent Stent) ×3 IMPLANT
TUBING CONTRAST HIGH PRESS 72 (TUBING) ×3 IMPLANT
WIRE G V18X300CM (WIRE) ×3 IMPLANT
WIRE J 3MM .035X145CM (WIRE) ×3 IMPLANT

## 2017-11-22 NOTE — H&P (Signed)
[facility] History & Physical Update  The patient was interviewed and re-examined.  The patient's previous History and Physical has been reviewed and is unchanged.  There is no change in the plan of care. We plan to proceed with the scheduled procedure.  Leotis Pain, MD  11/22/2017, 8:05 AM

## 2017-11-22 NOTE — Op Note (Addendum)
[facility]  Percutaneous Study/Intervention Procedural Note   Date of Surgery: 11/22/2017  Surgeon(s):Whitney Hillegass    Assistants:none  Pre-operative Diagnosis: PAD with claudication bilateral lower extremities  Post-operative diagnosis:  Same  Procedure(s) Performed:             1.  Ultrasound guidance for vascular access left femoral artery             2.  Catheter placement into right common femoral artery from left femoral approach             3.  Aortogram and selective right lower extremity angiogram             4.  Percutaneous transluminal angioplasty of right below-knee popliteal artery and tibioperoneal trunk with 4 mm diameter by 15 cm length Lutonix drug-coated angioplasty balloon             5.   Percutaneous transluminal angioplasty of the above-knee popliteal artery and the entire SFA as well as the common femoral artery with two 5 millimeter diameter by 22 cm length Lutonix drug-coated angioplasty balloons  6.  Viabahn stent placement to the right mid to distal SFA and above-knee popliteal artery for greater than 50% stenosis after angioplasty using a 6 mm diameter by 25 cm length stent             7.  StarClose closure device left femoral artery  EBL: 10 cc  Contrast: 65 cc  Fluoro Time: 7.4 minutes  Moderate Conscious Sedation Time: approximately 45 minutes using 3 mg of Versed and 75 Mcg of Fentanyl              Indications:  Patient is a 67 y.o.female with lifestyle limiting claudication bilaterally. The patient has noninvasive study showing reduced perfusion bilaterally. The patient is brought in for angiography for further evaluation and potential treatment.Risks and benefits are discussed and informed consent is obtained.   Procedure:  The patient was identified and appropriate procedural time out was performed.  The patient was then placed supine on the table and prepped and draped in the usual sterile fashion. Moderate conscious sedation  was administered during a face to face encounter with the patient throughout the procedure with my supervision of the RN administering medicines and monitoring the patient's vital signs, pulse oximetry, telemetry and mental status throughout from the start of the procedure until the patient was taken to the recovery room. Ultrasound was used to evaluate the left common femoral artery.  It was patent .  A digital ultrasound image was acquired.  A Seldinger needle was used to access the left common femoral artery under direct ultrasound guidance and a permanent image was performed.  A 0.035 J wire was advanced without resistance and a 5Fr sheath was placed.  Pigtail catheter was placed into the aorta and an AP aortogram was performed. This demonstrated a moderate sized somewhat saccular abdominal aortic aneurysm.  Multiple renal arteries on the left with what appeared to be a single renal artery on the right.  No stenosis in the renal arteries.  The aorta and iliac arteries had no significant stenosis. I then crossed the aortic bifurcation and advanced to the right common femoral artery at the right femoral head. Selective right lower extremity angiogram was then performed. This demonstrated near flush occlusion of the right superficial femoral artery also with some disease of the profunda femoris artery.  There was at least moderate disease within the common femoral artery itself.  The occlusion reconstituted the above-knee popliteal artery but there was disease within the above-knee popliteal artery, the below-knee popliteal artery, and the tibioperoneal trunk creating greater than 50% stenosis in all these locations.  The peroneal artery was the only runoff distally but there were no focal stenoses beyond the origin of the peroneal artery. It was felt that it was in the patient's best interest to proceed with intervention after these images to avoid a second procedure and a larger amount of contrast and fluoroscopy  based off of the findings from the initial angiogram. The patient was systemically heparinized and a 6 [place] Ansell sheath was then placed over the [employer] wire. I then used a Kumpe catheter and the advantage wire to navigate into the SFA and cross the long SFA and popliteal occlusion confirming intraluminal flow in the popliteal artery just below the knee.  I then exchanged for a 0.018 wire and the stenosis in the tibioperoneal trunk and parked the wire in the peroneal artery.  The long SFA and above-knee popliteal occlusion was treated with 2 separate 5 mm diameter by 22 cm length Lutonix drug-coated angioplasty balloons inflated from the common femoral artery down to the knee.  With the inflations were 10 to 12 atm for a minute.  The tibioperoneal trunk and distal popliteal artery were treated with a 4 mm diameter by 15 cm length Lutonix drug-coated angioplasty balloon inflated to 8 atm for 1 minute.  Completion imaging showed about a 30 to 40% residual stenosis in the below-knee popliteal artery and tibioperoneal trunk that was not flow-limiting.  There were 2 areas of flow-limiting stenosis of greater than 50% in the above-knee popliteal artery and distal SFA and so I elected to cover this area with a stent.  A 6 mm diameter by 25 cm length Viabahn stent was deployed in the above-knee popliteal artery and mid to distal SFA and postdilated with a 5 mm balloon with less than 10% residual stenosis after stent placement.  The proximal portion of the SFA and the common femoral artery had about a 30% residual stenosis which did not appear flow-limiting. I elected to terminate the procedure. The sheath was removed and StarClose closure device was deployed in the left femoral artery with excellent hemostatic result. The patient was taken to the recovery room in stable condition having tolerated the procedure well.  Findings:               Aortogram:  Moderate sized somewhat saccular abdominal aortic  aneurysm.  Multiple renal arteries on the left with what appeared to be a single renal artery on the right.  No stenosis in the renal arteries.  The aorta and iliac arteries had no significant stenosis.             Right lower Extremity:  Near flush occlusion of the right superficial femoral artery also with some disease of the profunda femoris artery.  There was at least moderate disease within the common femoral artery itself.  The occlusion reconstituted the above-knee popliteal artery but there was disease within the above-knee popliteal artery, the below-knee popliteal artery, and the tibioperoneal trunk creating greater than 50% stenosis in all these locations.  The peroneal artery was the only runoff distally but there were no focal stenoses beyond the origin of the peroneal artery.   Disposition: Patient was taken to the recovery room in stable condition having tolerated the procedure well.  Complications: None  Jennifer [name] 11/22/2017 9:08 AM   This  note was created with Dragon Medical transcription system. Any errors in dictation are purely unintentional.

## 2017-11-22 NOTE — Progress Notes (Signed)
RX for Lipitor given to Pt. Per Pt she has severe muscle cramps with Lipitor and all statins and she will not be taking new med ordered. MD aware, no further orders at this time.

## 2017-11-24 ENCOUNTER — Telehealth (INDEPENDENT_AMBULATORY_CARE_PROVIDER_SITE_OTHER): Payer: Self-pay

## 2017-11-24 NOTE — Telephone Encounter (Signed)
Spoke with the patient and gave her Dr. Bunnie Domino recommendations regarding her angio on Monday. I will also call into her pharmacy at [facility] Tramadol 50 mg #30 Q6hrs PRN no refills. This information was also given to the patient.

## 2017-11-24 NOTE — Telephone Encounter (Signed)
Patient called complaining of pain, some swelling, and trouble bending her right leg from her procedure on Monday.

## 2017-12-06 ENCOUNTER — Ambulatory Visit: Payer: Medicare Other | Admitting: Internal Medicine

## 2017-12-13 ENCOUNTER — Telehealth: Payer: Self-pay

## 2017-12-13 NOTE — Telephone Encounter (Signed)
Copied from [place] [id number]. Topic: General - Other >> Dec 13, 2017  4:49 PM Mcneil, Ja-Kwan wrote: Reason for CRM: Pt states she is in need of portable oxygen. Pt states she can come for an appt if it is needed however there are no available appt with Dr. Nicki Reaper this month. Pt states she would like to have the [employer] machine that she can carry on her back which makes it own oxygen. Pt requests call back. Cb# [phone]

## 2017-12-14 NOTE — Telephone Encounter (Signed)
Patient says that cardiology says she would benefit from 02 therapy. Patient says she feels like she is not getting enough air, patient says he has seen cardiology.Talked with PCP patient has appointment to come in on Monday.

## 2017-12-14 NOTE — Telephone Encounter (Signed)
Pt called and scheduled for 11/18

## 2017-12-14 NOTE — Telephone Encounter (Signed)
Jennifer [name] called and scheduled pt for 11/18. I can start on the paperwork for [employer] and hold message until she comes in for a visit and has walk completed.

## 2017-12-14 NOTE — Telephone Encounter (Signed)
Noted.  Please ask Juliann Pulse about this particular oxygen - on coverage.

## 2017-12-14 NOTE — Telephone Encounter (Signed)
See me about this patient. She is requesting to see if she will qualify for oxygen through [employer].

## 2017-12-15 ENCOUNTER — Inpatient Hospital Stay
Admission: EM | Admit: 2017-12-15 | Discharge: 2017-12-17 | DRG: 291 | Disposition: A | Discharge status: Home / Self Care | Payer: Medicare Other | Attending: Internal Medicine | Admitting: Internal Medicine

## 2017-12-15 ENCOUNTER — Other Ambulatory Visit: Payer: Self-pay

## 2017-12-15 ENCOUNTER — Emergency Department: Payer: Medicare Other

## 2017-12-15 DIAGNOSIS — J9601 Acute respiratory failure with hypoxia: Secondary | ICD-10-CM | POA: Diagnosis present

## 2017-12-15 DIAGNOSIS — E7800 Pure hypercholesterolemia, unspecified: Secondary | ICD-10-CM | POA: Diagnosis present

## 2017-12-15 DIAGNOSIS — N179 Acute kidney failure, unspecified: Secondary | ICD-10-CM | POA: Diagnosis present

## 2017-12-15 DIAGNOSIS — J9621 Acute and chronic respiratory failure with hypoxia: Secondary | ICD-10-CM | POA: Diagnosis present

## 2017-12-15 DIAGNOSIS — E785 Hyperlipidemia, unspecified: Secondary | ICD-10-CM | POA: Diagnosis present

## 2017-12-15 DIAGNOSIS — Z951 Presence of aortocoronary bypass graft: Secondary | ICD-10-CM

## 2017-12-15 DIAGNOSIS — E872 Acidosis: Secondary | ICD-10-CM | POA: Diagnosis present

## 2017-12-15 DIAGNOSIS — Z833 Family history of diabetes mellitus: Secondary | ICD-10-CM | POA: Diagnosis not present

## 2017-12-15 DIAGNOSIS — Z90710 Acquired absence of both cervix and uterus: Secondary | ICD-10-CM | POA: Diagnosis not present

## 2017-12-15 DIAGNOSIS — Z7982 Long term (current) use of aspirin: Secondary | ICD-10-CM | POA: Diagnosis not present

## 2017-12-15 DIAGNOSIS — Z7902 Long term (current) use of antithrombotics/antiplatelets: Secondary | ICD-10-CM | POA: Diagnosis not present

## 2017-12-15 DIAGNOSIS — I5023 Acute on chronic systolic (congestive) heart failure: Secondary | ICD-10-CM | POA: Diagnosis present

## 2017-12-15 DIAGNOSIS — I509 Heart failure, unspecified: Secondary | ICD-10-CM

## 2017-12-15 DIAGNOSIS — I110 Hypertensive heart disease with heart failure: Principal | ICD-10-CM | POA: Diagnosis present

## 2017-12-15 DIAGNOSIS — J9801 Acute bronchospasm: Secondary | ICD-10-CM | POA: Diagnosis present

## 2017-12-15 DIAGNOSIS — J9622 Acute and chronic respiratory failure with hypercapnia: Secondary | ICD-10-CM | POA: Diagnosis present

## 2017-12-15 DIAGNOSIS — E871 Hypo-osmolality and hyponatremia: Secondary | ICD-10-CM | POA: Diagnosis present

## 2017-12-15 DIAGNOSIS — I2510 Atherosclerotic heart disease of native coronary artery without angina pectoris: Secondary | ICD-10-CM | POA: Diagnosis present

## 2017-12-15 DIAGNOSIS — R0602 Shortness of breath: Secondary | ICD-10-CM

## 2017-12-15 DIAGNOSIS — Z888 Allergy status to other drugs, medicaments and biological substances status: Secondary | ICD-10-CM | POA: Diagnosis not present

## 2017-12-15 DIAGNOSIS — Z8249 Family history of ischemic heart disease and other diseases of the circulatory system: Secondary | ICD-10-CM | POA: Diagnosis not present

## 2017-12-15 DIAGNOSIS — Z87891 Personal history of nicotine dependence: Secondary | ICD-10-CM

## 2017-12-15 DIAGNOSIS — I739 Peripheral vascular disease, unspecified: Secondary | ICD-10-CM | POA: Diagnosis present

## 2017-12-15 DIAGNOSIS — Z823 Family history of stroke: Secondary | ICD-10-CM

## 2017-12-15 DIAGNOSIS — I248 Other forms of acute ischemic heart disease: Secondary | ICD-10-CM | POA: Diagnosis present

## 2017-12-15 DIAGNOSIS — J449 Chronic obstructive pulmonary disease, unspecified: Secondary | ICD-10-CM | POA: Diagnosis present

## 2017-12-15 DIAGNOSIS — J9602 Acute respiratory failure with hypercapnia: Secondary | ICD-10-CM

## 2017-12-15 DIAGNOSIS — Z79899 Other long term (current) drug therapy: Secondary | ICD-10-CM

## 2017-12-15 DIAGNOSIS — R0603 Acute respiratory distress: Secondary | ICD-10-CM | POA: Diagnosis not present

## 2017-12-15 DIAGNOSIS — R739 Hyperglycemia, unspecified: Secondary | ICD-10-CM | POA: Diagnosis present

## 2017-12-15 DIAGNOSIS — I447 Left bundle-branch block, unspecified: Secondary | ICD-10-CM | POA: Diagnosis present

## 2017-12-15 LAB — BLOOD GAS, VENOUS
Acid-base deficit: 10.6 mmol/L — ABNORMAL HIGH (ref 0.0–2.0)
Bicarbonate: 16.3 mmol/L — ABNORMAL LOW (ref 20.0–28.0)
O2 Saturation: 94.6 %
PCO2 VEN: 39 mmHg — AB (ref 44.0–60.0)
PH VEN: 7.23 — AB (ref 7.250–7.430)
Patient temperature: 37
pO2, Ven: 87 mmHg — ABNORMAL HIGH (ref 32.0–45.0)

## 2017-12-15 LAB — POCT I-STAT, CHEM 8
BUN: 11 mg/dL (ref 8–23)
Calcium, Ion: 1.14 mmol/L — ABNORMAL LOW (ref 1.15–1.40)
Chloride: 99 mmol/L (ref 98–111)
Creatinine, Ser: 1.1 mg/dL — ABNORMAL HIGH (ref 0.44–1.00)
Glucose, Bld: 367 mg/dL — ABNORMAL HIGH (ref 70–99)
HCT: 38 % (ref 36.0–46.0)
Hemoglobin: 12.9 g/dL (ref 12.0–15.0)
Potassium: 4.3 mmol/L (ref 3.5–5.1)
SODIUM: 131 mmol/L — AB (ref 135–145)
TCO2: 20 mmol/L — AB (ref 22–32)

## 2017-12-15 LAB — CBC WITH DIFFERENTIAL/PLATELET
ABS IMMATURE GRANULOCYTES: 0.09 10*3/uL — AB (ref 0.00–0.07)
BASOS PCT: 1 %
Basophils Absolute: 0.1 10*3/uL (ref 0.0–0.1)
Eosinophils Absolute: 0.2 10*3/uL (ref 0.0–0.5)
Eosinophils Relative: 2 %
HCT: 37.2 % (ref 36.0–46.0)
Hemoglobin: 11.7 g/dL — ABNORMAL LOW (ref 12.0–15.0)
IMMATURE GRANULOCYTES: 1 %
LYMPHS PCT: 39 %
Lymphs Abs: 3.8 10*3/uL (ref 0.7–4.0)
MCH: 30.2 pg (ref 26.0–34.0)
MCHC: 31.5 g/dL (ref 30.0–36.0)
MCV: 95.9 fL (ref 80.0–100.0)
MONOS PCT: 5 %
Monocytes Absolute: 0.4 10*3/uL (ref 0.1–1.0)
NEUTROS ABS: 5.2 10*3/uL (ref 1.7–7.7)
NEUTROS PCT: 52 %
PLATELETS: 479 10*3/uL — AB (ref 150–400)
RBC: 3.88 MIL/uL (ref 3.87–5.11)
RDW: 14.5 % (ref 11.5–15.5)
WBC: 9.8 10*3/uL (ref 4.0–10.5)
nRBC: 0 % (ref 0.0–0.2)

## 2017-12-15 LAB — BASIC METABOLIC PANEL
ANION GAP: 18 — AB (ref 5–15)
BUN: 13 mg/dL (ref 8–23)
CO2: 18 mmol/L — ABNORMAL LOW (ref 22–32)
Calcium: 8.9 mg/dL (ref 8.9–10.3)
Chloride: 96 mmol/L — ABNORMAL LOW (ref 98–111)
Creatinine, Ser: 1.13 mg/dL — ABNORMAL HIGH (ref 0.44–1.00)
GFR calc Af Amer: 57 mL/min — ABNORMAL LOW (ref >60)
GFR, EST NON AFRICAN AMERICAN: 49 mL/min — AB (ref >60)
Glucose, Bld: 360 mg/dL — ABNORMAL HIGH (ref 70–99)
POTASSIUM: 4.3 mmol/L (ref 3.5–5.1)
SODIUM: 132 mmol/L — AB (ref 135–145)

## 2017-12-15 LAB — MRSA PCR SCREENING: MRSA by PCR: NEGATIVE

## 2017-12-15 LAB — HEMOGLOBIN A1C
Hgb A1c MFr Bld: 5.3 % (ref 4.8–5.6)
Mean Plasma Glucose: 105.41 mg/dL

## 2017-12-15 LAB — BLOOD GAS, ARTERIAL
ACID-BASE EXCESS: 4.2 mmol/L — AB (ref 0.0–2.0)
BICARBONATE: 26.9 mmol/L (ref 20.0–28.0)
FIO2: 28
O2 Saturation: 97 %
PCO2 ART: 33 mmHg (ref 32.0–48.0)
PH ART: 7.52 — AB (ref 7.350–7.450)
Patient temperature: 37
pO2, Arterial: 81 mmHg — ABNORMAL LOW (ref 83.0–108.0)

## 2017-12-15 LAB — TROPONIN I: Troponin I: 0.12 ng/mL (ref <0.03)

## 2017-12-15 LAB — LACTIC ACID, PLASMA
LACTIC ACID, VENOUS: 1.5 mmol/L (ref 0.5–1.9)
LACTIC ACID, VENOUS: 2.6 mmol/L — AB (ref 0.5–1.9)

## 2017-12-15 LAB — GLUCOSE, CAPILLARY
GLUCOSE-CAPILLARY: 165 mg/dL — AB (ref 70–99)
Glucose-Capillary: 139 mg/dL — ABNORMAL HIGH (ref 70–99)
Glucose-Capillary: 134 mg/dL — ABNORMAL HIGH (ref 70–99)

## 2017-12-15 LAB — PROCALCITONIN: Procalcitonin: <0.1 ng/mL

## 2017-12-15 LAB — MAGNESIUM: Magnesium: 2.6 mg/dL — ABNORMAL HIGH (ref 1.7–2.4)

## 2017-12-15 LAB — BRAIN NATRIURETIC PEPTIDE: B NATRIURETIC PEPTIDE 5: 1536 pg/mL — AB (ref 0.0–100.0)

## 2017-12-15 LAB — CG4 I-STAT (LACTIC ACID): LACTIC ACID, VENOUS: 7.49 mmol/L — AB (ref 0.5–1.9)

## 2017-12-15 MED ORDER — SODIUM CHLORIDE 0.9 % IV SOLN: 250.0000 mL | INTRAVENOUS | Status: DC | PRN

## 2017-12-15 MED ORDER — ALPRAZOLAM 0.5 MG PO TABS: 0.2500 mg | ORAL_TABLET | Freq: Every evening | ORAL | Status: DC | PRN

## 2017-12-15 MED ORDER — SODIUM CHLORIDE 0.9% FLUSH
3.0000 mL | Freq: Two times a day (BID) | INTRAVENOUS | Status: DC
  Administered 2017-12-15 – 2017-12-17 (×5): 3 mL via INTRAVENOUS

## 2017-12-15 MED ORDER — HYDROCODONE-ACETAMINOPHEN 5-325 MG PO TABS
1.0000 | ORAL_TABLET | ORAL | Status: DC | PRN
  Administered 2017-12-15: 1 via ORAL
  Filled 2017-12-15: qty 1

## 2017-12-15 MED ORDER — ISOSORBIDE MONONITRATE ER 30 MG PO TB24: 60.0000 mg | ORAL_TABLET | Freq: Two times a day (BID) | ORAL | Status: DC

## 2017-12-15 MED ORDER — MAGNESIUM OXIDE 400 (241.3 MG) MG PO TABS
400.0000 mg | ORAL_TABLET | Freq: Every day | ORAL | Status: DC
  Administered 2017-12-15 – 2017-12-16 (×2): 400 mg via ORAL
  Filled 2017-12-15 (×2): qty 1

## 2017-12-15 MED ORDER — LOSARTAN POTASSIUM 25 MG PO TABS
25.0000 mg | ORAL_TABLET | Freq: Every day | ORAL | Status: DC
  Administered 2017-12-15 – 2017-12-17 (×3): 25 mg via ORAL
  Filled 2017-12-15 (×3): qty 1

## 2017-12-15 MED ORDER — SPIRONOLACTONE 25 MG PO TABS
25.0000 mg | ORAL_TABLET | Freq: Every day | ORAL | Status: DC
  Administered 2017-12-16 – 2017-12-17 (×2): 25 mg via ORAL
  Filled 2017-12-15 (×2): qty 1

## 2017-12-15 MED ORDER — ISOSORBIDE MONONITRATE ER 60 MG PO TB24
120.0000 mg | ORAL_TABLET | Freq: Every morning | ORAL | Status: DC
  Administered 2017-12-16 – 2017-12-17 (×2): 120 mg via ORAL
  Filled 2017-12-15: qty 2
  Filled 2017-12-15: qty 4

## 2017-12-15 MED ORDER — ONDANSETRON HCL 4 MG/2ML IJ SOLN: 4.0000 mg | Freq: Four times a day (QID) | INTRAMUSCULAR | Status: DC | PRN

## 2017-12-15 MED ORDER — ACETAMINOPHEN 325 MG PO TABS
650.0000 mg | ORAL_TABLET | Freq: Four times a day (QID) | ORAL | Status: DC | PRN
  Administered 2017-12-15 – 2017-12-16 (×2): 650 mg via ORAL
  Filled 2017-12-15 (×2): qty 2

## 2017-12-15 MED ORDER — POLYETHYLENE GLYCOL 3350 17 G PO PACK: 17.0000 g | PACK | Freq: Every day | ORAL | Status: DC | PRN

## 2017-12-15 MED ORDER — IPRATROPIUM-ALBUTEROL 0.5-2.5 (3) MG/3ML IN SOLN: 3.0000 mL | Freq: Four times a day (QID) | RESPIRATORY_TRACT | Status: DC

## 2017-12-15 MED ORDER — BISACODYL 5 MG PO TBEC: 5.0000 mg | DELAYED_RELEASE_TABLET | Freq: Every day | ORAL | Status: DC | PRN

## 2017-12-15 MED ORDER — ONDANSETRON HCL 4 MG PO TABS: 4.0000 mg | ORAL_TABLET | Freq: Four times a day (QID) | ORAL | Status: DC | PRN

## 2017-12-15 MED ORDER — CLOPIDOGREL BISULFATE 75 MG PO TABS
75.0000 mg | ORAL_TABLET | Freq: Every day | ORAL | Status: DC
  Administered 2017-12-16 – 2017-12-17 (×2): 75 mg via ORAL
  Filled 2017-12-15 (×2): qty 1

## 2017-12-15 MED ORDER — ACETAMINOPHEN 650 MG RE SUPP: 650.0000 mg | Freq: Four times a day (QID) | RECTAL | Status: DC | PRN

## 2017-12-15 MED ORDER — ALBUTEROL SULFATE (2.5 MG/3ML) 0.083% IN NEBU: 2.5000 mg | INHALATION_SOLUTION | RESPIRATORY_TRACT | Status: DC | PRN

## 2017-12-15 MED ORDER — SODIUM CHLORIDE 0.9% FLUSH: 3.0000 mL | INTRAVENOUS | Status: DC | PRN

## 2017-12-15 MED ORDER — ISOSORBIDE MONONITRATE ER 60 MG PO TB24
60.0000 mg | ORAL_TABLET | Freq: Every day | ORAL | Status: DC
  Administered 2017-12-15 – 2017-12-16 (×2): 60 mg via ORAL
  Filled 2017-12-15: qty 1
  Filled 2017-12-15: qty 2

## 2017-12-15 MED ORDER — FUROSEMIDE 10 MG/ML IJ SOLN
40.0000 mg | Freq: Two times a day (BID) | INTRAMUSCULAR | Status: DC
  Administered 2017-12-15 – 2017-12-16 (×2): 40 mg via INTRAVENOUS
  Filled 2017-12-15 (×2): qty 4

## 2017-12-15 MED ORDER — CARVEDILOL 3.125 MG PO TABS
3.1250 mg | ORAL_TABLET | Freq: Two times a day (BID) | ORAL | Status: DC
  Administered 2017-12-15 – 2017-12-17 (×4): 3.125 mg via ORAL
  Filled 2017-12-15 (×4): qty 1

## 2017-12-15 MED ORDER — ENOXAPARIN SODIUM 40 MG/0.4ML ~~LOC~~ SOLN
40.0000 mg | SUBCUTANEOUS | Status: DC
  Administered 2017-12-15 – 2017-12-16 (×2): 40 mg via SUBCUTANEOUS
  Filled 2017-12-15 (×2): qty 0.4

## 2017-12-15 MED ORDER — SIMETHICONE 80 MG PO CHEW
80.0000 mg | CHEWABLE_TABLET | Freq: Four times a day (QID) | ORAL | Status: DC | PRN
  Administered 2017-12-15: 80 mg via ORAL
  Filled 2017-12-15 (×2): qty 1

## 2017-12-15 MED ORDER — NITROGLYCERIN 0.4 MG SL SUBL: 0.4000 mg | SUBLINGUAL_TABLET | SUBLINGUAL | Status: DC | PRN

## 2017-12-15 MED ORDER — INSULIN ASPART 100 UNIT/ML ~~LOC~~ SOLN
0.0000 [IU] | Freq: Three times a day (TID) | SUBCUTANEOUS | Status: DC
  Administered 2017-12-15 – 2017-12-16 (×3): 1 [IU] via SUBCUTANEOUS
  Filled 2017-12-15 (×3): qty 1

## 2017-12-15 MED ORDER — SENNOSIDES-DOCUSATE SODIUM 8.6-50 MG PO TABS: 1.0000 | ORAL_TABLET | Freq: Every evening | ORAL | Status: DC | PRN

## 2017-12-15 MED ORDER — ASPIRIN EC 81 MG PO TBEC
81.0000 mg | DELAYED_RELEASE_TABLET | Freq: Every day | ORAL | Status: DC
  Administered 2017-12-16 – 2017-12-17 (×2): 81 mg via ORAL
  Filled 2017-12-15 (×2): qty 1

## 2017-12-15 MED ORDER — SACCHAROMYCES BOULARDII 250 MG PO CAPS
ORAL_CAPSULE | Freq: Every day | ORAL | Status: DC
  Administered 2017-12-16 – 2017-12-17 (×2): 250 mg via ORAL
  Filled 2017-12-15 (×2): qty 1

## 2017-12-15 MED ORDER — INSULIN ASPART 100 UNIT/ML ~~LOC~~ SOLN: 0.0000 [IU] | Freq: Every day | SUBCUTANEOUS | Status: DC

## 2017-12-15 MED ORDER — IPRATROPIUM-ALBUTEROL 0.5-2.5 (3) MG/3ML IN SOLN
RESPIRATORY_TRACT | Status: AC
  Administered 2017-12-15: 11:00:00
  Filled 2017-12-15: qty 9

## 2017-12-15 MED ORDER — FUROSEMIDE 10 MG/ML IJ SOLN
40.0000 mg | Freq: Once | INTRAMUSCULAR | Status: AC
  Administered 2017-12-15: 40 mg via INTRAVENOUS
  Filled 2017-12-15: qty 4

## 2017-12-15 MED ORDER — IPRATROPIUM-ALBUTEROL 0.5-2.5 (3) MG/3ML IN SOLN
3.0000 mL | Freq: Four times a day (QID) | RESPIRATORY_TRACT | Status: DC
  Administered 2017-12-15: 3 mL via RESPIRATORY_TRACT
  Filled 2017-12-15: qty 3

## 2017-12-15 MED ORDER — MORPHINE SULFATE (PF) 2 MG/ML IV SOLN: 1.0000 mg | Freq: Once | INTRAVENOUS | Status: DC

## 2017-12-15 MED ORDER — METHYLPREDNISOLONE SODIUM SUCC 125 MG IJ SOLR
INTRAMUSCULAR | Status: AC
  Administered 2017-12-15: 11:00:00
  Filled 2017-12-15: qty 2

## 2017-12-15 NOTE — Consult Note (Signed)
Name: Jennifer [name] MRN: [id number] DOB: 1950-09-12    ADMISSION DATE:  12/15/2017 CONSULTATION DATE: 12/15/2017  REFERRING MD : Dr. Bridgett Larsson   CHIEF COMPLAINT: Shortness of Breath   BRIEF PATIENT DESCRIPTION:  67 yo female admitted with acute on chronic hypoxic respiratory failure secondary to acute CHF exacerbation requiring Bipap   SIGNIFICANT EVENTS  11/13-Pt admitted to the stepdown unit on Bipap   STUDIES:  None   HISTORY OF PRESENT ILLNESS:   This is a 67 yo female with a PMH of Chronic Systolic CHF (Echo 32/35/5732 EF 40%-45%), COPD, HTN, PVD (underwent RLE angiogram with stent placement on 11/22/17), Hypercholesteremia, Endometriosis, and CAD s/p CABG. She presented to [facility] ER via EMS on 11/13 with c/o chest pain and shortness of breath.  Per ER notes the pt stated symptoms started several days prior to presentation, however due to increased in severity she proceeded to the ER. She states she does have a hx of chest spasms and when it is cold outside this triggers severe shortness of breath. Per EMS upon their arrival at pts home she was hypoxic with O2 sats in the 80's, she received 2 duoneb treatments en route to the ER. In the ER she was placed on Bipap.  CXR revealed pulmonary edema and EKG showed sinus tachycardia, left BBB, diffuse ST depression without ST elevation findings similar to previous EKG's.  Lab results revealed Na+ 132, CO2 18, glucose 360, creatinine 1.13, calcium 18, magnesium 2.6, BNP 1,536, troponin <0.03, and hgb 11.7.  She received 40 mg iv lasix x1 dose and was subsequently admitted to the stepdown unit for additional workup and treatment.   PAST MEDICAL HISTORY :   has a past medical history of CAD (coronary artery disease) (2003), Dyspnea, Endometriosis, Headache, Hypercholesteremia, Hypertension, PVD (peripheral vascular disease) ([place]), and Systolic CHF ([place]).  has a past surgical history that includes Coronary artery bypass graft; Abdominal hysterectomy;  Breast surgery (2004); cataract surgery (2007); Colonoscopy with propofol (N/A, 10/30/2015); Vascular surgery; Eye surgery; and Lower Extremity Angiography (Right, 11/22/2017). Prior to Admission medications   Medication Sig Start Date End Date Taking? Authorizing Provider  ALPRAZolam (XANAX) 0.5 MG tablet Take 1/2 - 1 tablet q hs prn. Patient taking differently: Take 0.25-0.5 mg by mouth at bedtime as needed. Take 1/2 - 1 tablet q hs prn. 07/28/17  Yes Einar Pheasant, MD  Ascorbic Acid (VITAMIN C) 1000 MG tablet Take 1,000 mg by mouth 2 (two) times daily.    Yes [provider]  aspirin EC 81 MG tablet Take 81 mg by mouth daily.   Yes [provider]  carvedilol (COREG) 3.125 MG tablet Take 1 tablet (3.125 mg total) by mouth 2 (two) times daily with a meal. 12/01/11  Yes Einar Pheasant, MD  Cholecalciferol (CVS D3) 2000 units CAPS Take 2,000 Units by mouth 2 (two) times daily.   Yes [provider]  clopidogrel (PLAVIX) 75 MG tablet Take 1 tablet (75 mg total) by mouth daily. 11/04/15  Yes Byrnett, Forest Gleason, MD  Coenzyme Q10 (COQ10) 100 MG CAPS Take 1 capsule by mouth 2 (two) times daily.   Yes [provider]  Elderberry 575 MG/5ML SYRP Take by mouth daily.   Yes [provider]  furosemide (LASIX) 40 MG tablet Take 40 mg by mouth daily.  12/01/11  Yes Einar Pheasant, MD  Garlic 2025 MG CAPS Take 1,000 mg by mouth daily.   Yes [provider]  isosorbide mononitrate (IMDUR) 60 MG 24  hr tablet Take 60-120 mg by mouth 2 (two) times daily. Take 120 mg by mouth in the morning and 60 mg by mouth in the evening.   Yes [provider]  KRILL OIL PO Take 2,000 mg by mouth daily.    Yes [provider]  losartan (COZAAR) 25 MG tablet Take 25 mg by mouth daily.    Yes [provider]  Magnesium 250 MG TABS Take 500 mg by mouth at bedtime.    Yes [provider]  Multiple Vitamin (MULTIVITAMIN WITH MINERALS) TABS  tablet Take 1 tablet by mouth 2 (two) times daily.   Yes [provider]  nitroGLYCERIN (NITROSTAT) 0.4 MG SL tablet Place 1 tablet (0.4 mg total) under the tongue every 5 (five) minutes as needed for chest pain. 12/01/11  Yes Einar Pheasant, MD  OIL OF OREGANO PO Take 1,920 mg by mouth daily as needed (SINUS).    Yes [provider]  Red Yeast Rice 600 MG CAPS Take 2 capsules by mouth 2 (two) times daily.   Yes [provider]  Saccharomyces boulardii (PROBIOTIC) 250 MG CAPS Take 1 capsule by mouth daily.   Yes [provider]  Sodium Chloride-Xylitol (XLEAR SINUS CARE SPRAY) SOLN Place 1 spray into the nose daily.   Yes [provider]  spironolactone (ALDACTONE) 25 MG tablet Take 1 tablet (25 mg total) by mouth daily. 08/06/17  Yes Hackney, Tina A, FNP  TURMERIC PO Take 2 tablets by mouth daily.    Yes [provider]  vitamin E (VITAMIN E) 400 UNIT capsule Take 400 Units by mouth daily.    Yes [provider]  zinc gluconate 50 MG tablet Take 50 mg by mouth daily.   Yes [provider]  atorvastatin (LIPITOR) 10 MG tablet Take 1 tablet (10 mg total) by mouth daily. Patient not taking: Reported on 12/15/2017 11/22/17 11/22/18  Algernon Huxley, MD  Cholecalciferol (DDROPS PO) Take 1 tablet by mouth.    [provider]  Multiple Vitamins-Minerals (AIRBORNE PO) Take by mouth.    [provider]  Multiple Vitamins-Minerals (ANTIOXIDANT FORMULA SG) capsule Take 1 capsule by mouth daily.    [provider]  Multiple Vitamins-Minerals (ANTIOXIDANT FORMULA SG) capsule Take 1 capsule by mouth daily.    [provider]   Allergies  Allergen Reactions  . Cyclobenzaprine Other (See Comments)    Felt bad  . Prednisone Other (See Comments)    Felt bad    FAMILY HISTORY:  family history includes CVA in her mother; Diabetes in her sister; Heart disease in her father, mother, and unknown  relative. SOCIAL HISTORY:  reports that she quit smoking about 9 years ago. She has never used smokeless tobacco. She reports that she drinks alcohol. She reports that she does not use drugs.  REVIEW OF SYSTEMS: Positives in BOLD   Constitutional: Negative for fever, chills, weight loss, malaise/fatigue and diaphoresis.  HENT: Negative for hearing loss, ear pain, nosebleeds, congestion, sore throat, neck pain, tinnitus and ear discharge.   Eyes: Negative for blurred vision, double vision, photophobia, pain, discharge and redness.  Respiratory: cough, hemoptysis, sputum production, shortness of breath, wheezing and stridor.   Cardiovascular: chest pain, palpitations, orthopnea, claudication, leg swelling and PND.  Gastrointestinal: Negative for heartburn, nausea, vomiting, abdominal pain, diarrhea, constipation, blood in stool and melena.  Genitourinary: Negative for dysuria, urgency, frequency, hematuria and flank pain.  Musculoskeletal: Negative for myalgias, back pain, joint pain and falls.  Skin:  Negative for itching and rash.  Neurological: Negative for dizziness, tingling, tremors, sensory change, speech change, focal weakness, seizures, loss of consciousness, weakness and headaches.  Endo/Heme/Allergies: Negative for environmental allergies and polydipsia. Does not bruise/bleed easily.  SUBJECTIVE:  She states her breathing has improved she is currently on 2L via nasal canula and her chest pain has subsided   VITAL SIGNS: Temp:  [97.2 F (36.2 C)] 97.2 F (36.2 C) (11/13 1113) Pulse Rate:  [91-122] 91 (11/13 1345) Resp:  [16-31] 20 (11/13 1345) BP: (114-148)/(54-92) 114/54 (11/13 1345) SpO2:  [74 %-99 %] 97 % (11/13 1345) Weight:  [68 kg] 68 kg (11/13 1116)  PHYSICAL EXAMINATION: General: well developed, well nourished female, NAD  Neuro: alert and oriented, follows commands  HEENT: supple, no JVD  Cardiovascular: nsr with left BBB, no R/G Lungs: clear throughout, even, non  labored  Abdomen: +BS x4, soft, non tender, non distended Musculoskeletal: normal bulk and tone, no edema  Skin: intact no rashes or lesions   Recent Labs  Lab 12/15/17 1114 12/15/17 1125  NA 132* 131*  K 4.3 4.3  CL 96* 99  CO2 18*  --   BUN 13 11  CREATININE 1.13* 1.10*  GLUCOSE 360* 367*   Recent Labs  Lab 12/15/17 1114 12/15/17 1125  HGB 11.7* 12.9  HCT 37.2 38.0  WBC 9.8  --   PLT 479*  --    Dg Chest Portable 1 View  Result Date: 12/15/2017 CLINICAL DATA:  Short of breath EXAM: PORTABLE CHEST 1 VIEW COMPARISON:  07/14/2017 FINDINGS: Mild progression of diffuse bilateral airspace disease consistent with edema. Progression of small bilateral effusions. Postop CABG. IMPRESSION: Mild progression of pulmonary edema and small pleural effusions. Electronically Signed   By: Franchot Gallo M.D.   On: 12/15/2017 11:22    ASSESSMENT / PLAN:  Acute on chronic hypoxic respiratory failure secondary to acute CHF exacerbation  Prn Bipap for dyspnea and/or hypoxia  Scheduled and prn bronchodilator therapy Repeat CXR in am   Acute on chronic systolic CHF exacerbation  Hx: HTN, Hypercholesteremia, PVD, and CAD Continuous telemetry monitoring Continue outpatient cardiac medications Continue iv lasix  Cardiology consulted appreciate input   Acute renal failure  Trend BMP and lactic acid  Replace electrolytes as indicated  Monitor UOP  Hyperglycemia CBG's ac/hs SSI   VTE px: subq lovenox  Marda Stalker, Brenda Pager [phone] (please enter 7 digits) PCCM Consult Pager [phone] (please enter 7 digits)

## 2017-12-15 NOTE — ED Notes (Signed)
Unable to transport PT to floor at this time d/t respiratory has emergency pt coming in and is unable to transport pt on bipap with RN at this time

## 2017-12-15 NOTE — ED Notes (Signed)
RN just now made aware of PT's assigned bed.

## 2017-12-15 NOTE — Progress Notes (Signed)
Advanced Care Plan.  Purpose of Encounter: CODE STATUS. Parties in Attendance: The patient, her husband and me. Patient's Decisional Capacity: Yes. Medical Story: Jennifer [name]  is a 67 y.o. female with a known history of CAD, chronic systolic CHF, hypertension, hyperlipidemia, PVD and endometriosis.  The patient is being admitted for acute respiratory failure with hypoxia due to acute on chronic systolic CHF.  I discussed with the patient about her current critical condition, prognosis and CODE STATUS.  The patient stated that she want to be resuscitated and intubated if she has cardiopulmonary arrest. Plan:  Code Status: Full code Time spent discussing advance care planning: 18 minutes.

## 2017-12-15 NOTE — ED Notes (Signed)
Pt placed on sol pads per MD d/t widened complexes and c/o chest pain

## 2017-12-15 NOTE — ED Provider Notes (Signed)
[facility] Emergency Department Provider Note  ____________________________________________  Time seen: Approximately 11:37 AM  I have reviewed the triage vital signs and the nursing notes.   HISTORY  Chief Complaint Respiratory Distress   HPI Jennifer [name] is a 67 y.o. female with a history of CHF with EF of 40-45%, CAD status post CABG, hypertension, hyperlipidemia, COPD who presents for evaluation of shortness of breath. Patient reports progressively worsening shortness of breath over the last several days which became severe and constant this morning. She does not have inhalers at home. She has had a cough for the last week that is productive of white sputum. No fever or chills.no chest pain, no abdominal pain, no body aches, no vomiting or diarrhea. No personal or family history of blood clots, no leg pain or swelling, no hemoptysis, no exogenous hormones. Patient recently had angioplasty and stent placed on RLE last month. per EMS patient was hypoxic to the 80s upon arrival which improved in route after 2 DuoNebs.  Past Medical History:  Diagnosis Date  . CAD (coronary artery disease) 2003   s/p CABG x 5 (Dr Evelina Dun), [place]- 2010  . Dyspnea   . Endometriosis   . Headache   . Hypercholesteremia   . Hypertension   . PVD (peripheral vascular disease) ([place])    s/p intervention (left) - 2004, right (2006)  . Systolic CHF ([place])    last EF 40%    Patient Active Problem List   Diagnosis Date Noted  . CHF exacerbation ([place]) 07/14/2017  . CHF (congestive heart failure) ([place]) 05/25/2017  . Carotid stenosis 01/01/2017  . Atherosclerosis of native arteries of extremity with intermittent claudication ([place]) 06/19/2016  . Encounter for screening colonoscopy 09/21/2015  . Carotid bruit 11/20/2014  . Health care maintenance 05/25/2014  . Plantar fasciitis 01/08/2013  . Peripheral vascular disease ([place]) 12/07/2011  . CAD (coronary artery disease) 12/01/2011  .  Hypertension 12/01/2011  . Hypercholesteremia 12/01/2011    Past Surgical History:  Procedure Laterality Date  . ABDOMINAL HYSTERECTOMY    . BREAST SURGERY  2004   cyst removed  . cataract surgery  2007   left  . COLONOSCOPY WITH PROPOFOL N/A 10/30/2015   Procedure: COLONOSCOPY WITH PROPOFOL;  Surgeon: Robert Bellow, MD;  Location: [facility] ENDOSCOPY;  Service: Endoscopy;  Laterality: N/A;  . CORONARY ARTERY BYPASS GRAFT     2003  . EYE SURGERY     Cataract  . LOWER EXTREMITY ANGIOGRAPHY Right 11/22/2017   Procedure: LOWER EXTREMITY ANGIOGRAPHY;  Surgeon: Algernon Huxley, MD;  Location: [facility] CV LAB;  Service: Cardiovascular;  Laterality: Right;  [facility] VASCULAR SURGERY      Prior to Admission medications   Medication Sig Start Date End Date Taking? Authorizing Provider  ALPRAZolam Duanne Moron) 0.5 MG tablet Take 1/2 - 1 tablet q hs prn. 07/28/17   Einar Pheasant, MD  Ascorbic Acid (VITAMIN C) 1000 MG tablet Take 1,000 mg by mouth 2 (two) times daily.     [provider]  aspirin EC 81 MG tablet Take 81 mg by mouth daily.    [provider]  atorvastatin (LIPITOR) 10 MG tablet Take 1 tablet (10 mg total) by mouth daily. 11/22/17 11/22/18  Algernon Huxley, MD  carvedilol (COREG) 3.125 MG tablet Take 1 tablet (3.125 mg total) by mouth 2 (two) times daily with a meal. 12/01/11   Einar Pheasant, MD  Cholecalciferol (CVS D3) 2000 units CAPS Take 2,000 Units by mouth 2 (  two) times daily.    [provider]  Cholecalciferol (DDROPS PO) Take 1 tablet by mouth.    [provider]  clopidogrel (PLAVIX) 75 MG tablet Take 1 tablet (75 mg total) by mouth daily. 11/04/15   Robert Bellow, MD  Coenzyme Q10 (COQ10) 100 MG CAPS Take 1 capsule by mouth 2 (two) times daily.    [provider]  Elderberry 575 MG/5ML SYRP Take by mouth daily.    [provider]  furosemide (LASIX) 40 MG tablet Take 40 mg by mouth daily.  12/01/11   Einar Pheasant, MD    Garlic 7124 MG CAPS Take 1,000 mg by mouth daily.    [provider]  isosorbide mononitrate (IMDUR) 60 MG 24 hr tablet Take 60-120 mg by mouth 2 (two) times daily. Take 120 mg by mouth in the morning and 60 mg by mouth in the evening.    [provider]  KRILL OIL PO Take 2,000 mg by mouth daily.     [provider]  losartan (COZAAR) 25 MG tablet Take 25 mg by mouth daily.     [provider]  Magnesium 250 MG TABS Take 500 mg by mouth at bedtime.     [provider]  Multiple Vitamin (MULTIVITAMIN WITH MINERALS) TABS tablet Take 1 tablet by mouth 2 (two) times daily.    [provider]  Multiple Vitamins-Minerals (AIRBORNE PO) Take by mouth.    [provider]  Multiple Vitamins-Minerals (ANTIOXIDANT FORMULA SG) capsule Take 1 capsule by mouth daily.    [provider]  Multiple Vitamins-Minerals (ANTIOXIDANT FORMULA SG) capsule Take 1 capsule by mouth daily.    [provider]  nitroGLYCERIN (NITROSTAT) 0.4 MG SL tablet Place 1 tablet (0.4 mg total) under the tongue every 5 (five) minutes as needed for chest pain. 12/01/11   Einar Pheasant, MD  OIL OF OREGANO PO Take 1,920 mg by mouth daily as needed (SINUS).     [provider]  Red Yeast Rice 600 MG CAPS Take 2 capsules by mouth 2 (two) times daily.    [provider]  Saccharomyces boulardii (PROBIOTIC) 250 MG CAPS Take 1 capsule by mouth daily.    [provider]  Sodium Chloride-Xylitol (XLEAR SINUS CARE SPRAY) SOLN Place 1 spray into the nose daily.    [provider]  spironolactone (ALDACTONE) 25 MG tablet Take 1 tablet (25 mg total) by mouth daily. Patient taking differently: Take 12.5 mg by mouth daily.  08/06/17   Alisa Graff, FNP  TURMERIC PO Take 2 tablets by mouth daily.     [provider]  vitamin E (VITAMIN E) 400 UNIT capsule Take 400 Units by mouth every Monday, Wednesday, and Friday.     [provider]  zinc gluconate 50 MG tablet Take 50 mg by mouth daily.    [provider]    Allergies Cyclobenzaprine and Prednisone  Family History  Problem Relation Age of Onset  . Heart disease Mother   . CVA Mother   . Heart disease Father        CABG  . Heart disease Unknown        aunts and uncles  . Diabetes Sister   . Breast cancer Neg Hx   . Colon cancer Neg Hx     Social History Social History   Tobacco Use  . Smoking status: Former Smoker    Last attempt to quit: 09/03/2008    Years  since quitting: 9.2  . Smokeless tobacco: Never Used  Substance Use Topics  . Alcohol use: Yes    Alcohol/week: 0.0 standard drinks    Comment: once a year  . Drug use: No    Review of Systems  Constitutional: Negative for fever. Eyes: Negative for visual changes. ENT: Negative for sore throat. Neck: No neck pain  Cardiovascular: Negative for chest pain. Respiratory: + shortness of breath and cough Gastrointestinal: Negative for abdominal pain, vomiting or diarrhea. Genitourinary: Negative for dysuria. Musculoskeletal: Negative for back pain. Skin: Negative for rash. Neurological: Negative for headaches, weakness or numbness. Psych: No SI or HI  ____________________________________________   PHYSICAL EXAM:  VITAL SIGNS: ED Triage Vitals  Enc Vitals Group     BP 12/15/17 1113 (!) 144/87     Pulse Rate 12/15/17 1110 (!) 118     Resp 12/15/17 1110 (!) 30     Temp 12/15/17 1113 (!) 97.2 F (36.2 C)     Temp Source 12/15/17 1113 Axillary     SpO2 12/15/17 1104 99 %     Weight 12/15/17 1116 150 lb (68 kg)     Height 12/15/17 1116 5\' 7"  (1.702 m)     Head Circumference --      Peak Flow --      Pain Score 12/15/17 1116 7     Pain Loc --      Pain Edu? --      Excl. in [place]? --     Constitutional: Alert and oriented, severe respiratory distress.  HEENT:      Head: Normocephalic and atraumatic.         Eyes: Conjunctivae are normal. Sclera is  non-icteric.       Mouth/Throat: Mucous membranes are moist.       Neck: Supple with no signs of meningismus. Cardiovascular: tachycardic with regular rhythm. No murmurs, gallops, or rubs. 2+ symmetrical distal pulses are present in all extremities. No JVD. Respiratory: . Increased work of breathing, tachypnea, hypoxic, diffuse course rhonchi bilaterally Gastrointestinal: Soft, non tender, and non distended with positive bowel sounds. No rebound or guarding. Musculoskeletal: Nontender with normal range of motion in all extremities. No edema, cyanosis, or erythema of extremities. Neurologic: Normal speech and language. Face is symmetric. Moving all extremities. No gross focal neurologic deficits are appreciated. Skin: Skin is warm, dry and intact. No rash noted. Psychiatric: Mood and affect are normal. Speech and behavior are normal.  ____________________________________________   LABS (all labs ordered are listed, but only abnormal results are displayed)  Labs Reviewed  CBC WITH DIFFERENTIAL/PLATELET - Abnormal; Notable for the following components:      Result Value   Hemoglobin 11.7 (*)    Platelets 479 (*)    Abs Immature Granulocytes 0.09 (*)    All other components within normal limits  BASIC METABOLIC PANEL - Abnormal; Notable for the following components:   Sodium 132 (*)    Chloride 96 (*)    CO2 18 (*)    Glucose, Bld 360 (*)    Creatinine, Ser 1.13 (*)    GFR calc non Af Amer 49 (*)    GFR calc Af Amer 57 (*)    Anion gap 18 (*)    All other components within normal limits  BLOOD GAS, VENOUS - Abnormal; Notable for the following components:   pH, Ven 7.23 (*)    pCO2, Ven 39 (*)    pO2, Ven 87.0 (*)    Bicarbonate 16.3 (*)  Acid-base deficit 10.6 (*)    All other components within normal limits  MAGNESIUM - Abnormal; Notable for the following components:   Magnesium 2.6 (*)    All other components within normal limits  CG4 I-STAT (LACTIC ACID) - Abnormal;  Notable for the following components:   Lactic Acid, Venous 7.49 (*)    All other components within normal limits  POCT I-STAT, CHEM 8 - Abnormal; Notable for the following components:   Sodium 131 (*)    Creatinine, Ser 1.10 (*)    Glucose, Bld 367 (*)    Calcium, Ion 1.14 (*)    TCO2 20 (*)    All other components within normal limits  TROPONIN I  BRAIN NATRIURETIC PEPTIDE  LACTIC ACID, PLASMA   ____________________________________________  EKG  ED ECG REPORT I, Rudene Re, the attending physician, personally viewed and interpreted this ECG.  Sinus tachycardia, rate of 123, left bundle branch block, normal QTC, left axis deviation,  diffuse ST depressions with no ST elevation. Unchanged from prior.  ____________________________________________  RADIOLOGY  I have personally reviewed the images performed during this visit and I agree with the Radiologist's read.   Interpretation by Radiologist:  Dg Chest Portable 1 View  Result Date: 12/15/2017 CLINICAL DATA:  Short of breath EXAM: PORTABLE CHEST 1 VIEW COMPARISON:  07/14/2017 FINDINGS: Mild progression of diffuse bilateral airspace disease consistent with edema. Progression of small bilateral effusions. Postop CABG. IMPRESSION: Mild progression of pulmonary edema and small pleural effusions. Electronically Signed   By: Franchot Gallo M.D.   On: 12/15/2017 11:22     ____________________________________________   PROCEDURES  Procedure(s) performed: None Procedures Critical Care performed: yes  CRITICAL CARE Performed by: Rudene Re  ?  Total critical care time: 40 min  Critical care time was exclusive of separately billable procedures and treating other patients.  Critical care was necessary to treat or prevent imminent or life-threatening deterioration.  Critical care was time spent personally by me on the following activities: development of treatment plan with patient and/or surrogate as well as  nursing, discussions with consultants, evaluation of patient's response to treatment, examination of patient, obtaining history from patient or surrogate, ordering and performing treatments and interventions, ordering and review of laboratory studies, ordering and review of radiographic studies, pulse oximetry and re-evaluation of patient's condition.  ____________________________________________   INITIAL IMPRESSION / ASSESSMENT AND PLAN / ED COURSE   67 y.o. female with a history of CHF with EF of 40-45%, CAD status post CABG, hypertension, hyperlipidemia, COPD who presents for evaluation of shortness of breath.patient arrives in severe respiratory distress, hypoxic, tachypneic and with coarse rhonchi. She was started on BiPAP. The patient reports improvement of her breathing status in route after doing labs therefore those were continued and she was also given Solu-Medrol. Patient was given 40 mg of IV Lasix after chest x-ray shows pulmonary edema. EKG showing sinus tachycardia with left bundle branch block which is unchanged from prior. The patient noted to have a couple of runs of V. tach on telemetry and therefore pacer pads were attached.labs show a significantly elevated lactic of 7.49 in the setting of several albuterol treatments and most likely demand ischemia from severe tachycardia. In the setting of CHF exacerbation I will hold all fluids. The patient with no infectious symptoms including no fever, normal white count.electrolytes are within normal limits including potassium and magnesium. First troponin is negative.VBG showing slightly low pH of 7.23 with a low PCO2 of 39 concerning for CHF and  not as much COPD exacerbation. We'll discuss with the hospitalist for admission.      As part of my medical decision making, I reviewed the following data within the [place] notes reviewed and incorporated, Labs reviewed , EKG interpreted , Old EKG reviewed, Old chart  reviewed, Radiograph reviewed , Discussed with admitting physician , Notes from prior ED visits and [place] Controlled Substance Database    Pertinent labs & imaging results that were available during my care of the patient were reviewed by me and considered in my medical decision making (see chart for details).    ____________________________________________   FINAL CLINICAL IMPRESSION(S) / ED DIAGNOSES  Final diagnoses:  Acute respiratory failure with hypoxia (HCC)  Acute on chronic congestive heart failure, unspecified heart failure type ([place])      NEW MEDICATIONS STARTED DURING THIS VISIT:  ED Discharge Orders    None       Note:  This document was prepared using Dragon voice recognition software and may include unintentional dictation errors.    Rudene Re, MD 12/15/17 [phone]

## 2017-12-15 NOTE — ED Notes (Addendum)
Attempted to call report, receiving RN busy. Will attempt again in 15 minutes if not called back

## 2017-12-15 NOTE — ED Notes (Signed)
Pt placed on bipap  

## 2017-12-15 NOTE — ED Triage Notes (Signed)
Pt arrived via ems for report of shortness of breath - pt arrived in respiratory distress with and O2 sat of 70% on 6L of O2 via n/c - pt reports productive cough x2 days - EKG shoed ST elevation but transmitted to provider and stemi not called - pt is now c/o chest pain/pressure - pt was given 2 albuterol and 2 duoneb treatments in route

## 2017-12-15 NOTE — ED Notes (Signed)
ISTAT Lactic 7.5 - reported to Dr Alfred Levins by Elmo Putt RN - awaiting new orders

## 2017-12-15 NOTE — H&P (Signed)
[facility] [place] NAME: Jennifer [name]    MR#:  [id number]  DATE OF BIRTH:  10-13-1950  DATE OF ADMISSION:  12/15/2017  PRIMARY CARE PHYSICIAN: Einar Pheasant, MD   REQUESTING/REFERRING PHYSICIAN: Rudene Re, MD  CHIEF COMPLAINT:   Chief Complaint  Patient presents with  . Respiratory Distress   Shortness of breath and cough for several days, worsening today. HISTORY OF PRESENT ILLNESS:  Jennifer [name]  is a 67 y.o. female with a known history of CAD, chronic systolic CHF, hypertension, hyperlipidemia, PVD and endometriosis.  The patient has had cough and shortness of breath for the past few day, which has been worsening since this morning.  She denies any fever or chills, no chest pain or palpitation or leg swelling.  She denies orthopnea or nocturnal dyspnea.  She was found in respiratory distress with O2 saturation down to 80s and put on BiPAP in the ED.  Chest x-ray show pulmonary edema.  She is treated with DuoNeb and Lasix in the ED.  PAST MEDICAL HISTORY:   Past Medical History:  Diagnosis Date  . CAD (coronary artery disease) 2003   s/p CABG x 5 (Dr Evelina Dun), MI- 2010  . Dyspnea   . Endometriosis   . Headache   . Hypercholesteremia   . Hypertension   . PVD (peripheral vascular disease) ([place])    s/p intervention (left) - 2004, right (2006)  . Systolic CHF ([place])    last EF 40%    PAST SURGICAL HISTORY:   Past Surgical History:  Procedure Laterality Date  . ABDOMINAL HYSTERECTOMY    . BREAST SURGERY  2004   cyst removed  . cataract surgery  2007   left  . COLONOSCOPY WITH PROPOFOL N/A 10/30/2015   Procedure: COLONOSCOPY WITH PROPOFOL;  Surgeon: Robert Bellow, MD;  Location: [facility] ENDOSCOPY;  Service: Endoscopy;  Laterality: N/A;  . CORONARY ARTERY BYPASS GRAFT     2003  . EYE SURGERY     Cataract  . LOWER EXTREMITY ANGIOGRAPHY Right 11/22/2017   Procedure: LOWER EXTREMITY ANGIOGRAPHY;  Surgeon: Algernon Huxley, MD;  Location: [facility] CV LAB;  Service: Cardiovascular;  Laterality: Right;  [facility] VASCULAR SURGERY      SOCIAL HISTORY:   Social History   Tobacco Use  . Smoking status: Former Smoker    Last attempt to quit: 09/03/2008    Years since quitting: 9.2  . Smokeless tobacco: Never Used  Substance Use Topics  . Alcohol use: Yes    Alcohol/week: 0.0 standard drinks    Comment: once a year    FAMILY HISTORY:   Family History  Problem Relation Age of Onset  . Heart disease Mother   . CVA Mother   . Heart disease Father        CABG  . Heart disease Unknown        aunts and uncles  . Diabetes Sister   . Breast cancer Neg Hx   . Colon cancer Neg Hx     DRUG ALLERGIES:   Allergies  Allergen Reactions  . Cyclobenzaprine Other (See Comments)    Felt bad  . Prednisone Other (See Comments)    Felt bad    REVIEW OF SYSTEMS:   Review of Systems  Constitutional: Positive for malaise/fatigue. Negative for chills and fever.  HENT: Negative for sore throat.   Eyes: Negative for blurred vision and double vision.  Respiratory: Positive for cough, sputum production and  shortness of breath. Negative for hemoptysis, wheezing and stridor.   Cardiovascular: Negative for chest pain, palpitations, orthopnea and leg swelling.  Gastrointestinal: Negative for abdominal pain, blood in stool, diarrhea, melena, nausea and vomiting.  Genitourinary: Negative for dysuria, flank pain and hematuria.  Musculoskeletal: Negative for back pain and joint pain.  Skin: Negative for rash.  Neurological: Negative for dizziness, sensory change, focal weakness, seizures, loss of consciousness, weakness and headaches.  Endo/Heme/Allergies: Negative for polydipsia.  Psychiatric/Behavioral: Negative for depression. The patient is not nervous/anxious.     MEDICATIONS AT HOME:   Prior to Admission medications   Medication Sig Start Date End Date Taking? Authorizing Provider  ALPRAZolam (XANAX) 0.5  MG tablet Take 1/2 - 1 tablet q hs prn. Patient taking differently: Take 0.25-0.5 mg by mouth at bedtime as needed. Take 1/2 - 1 tablet q hs prn. 07/28/17  Yes Einar Pheasant, MD  Ascorbic Acid (VITAMIN C) 1000 MG tablet Take 1,000 mg by mouth 2 (two) times daily.    Yes [provider]  aspirin EC 81 MG tablet Take 81 mg by mouth daily.   Yes [provider]  carvedilol (COREG) 3.125 MG tablet Take 1 tablet (3.125 mg total) by mouth 2 (two) times daily with a meal. 12/01/11  Yes Einar Pheasant, MD  Cholecalciferol (CVS D3) 2000 units CAPS Take 2,000 Units by mouth 2 (two) times daily.   Yes [provider]  clopidogrel (PLAVIX) 75 MG tablet Take 1 tablet (75 mg total) by mouth daily. 11/04/15  Yes Byrnett, Forest Gleason, MD  Coenzyme Q10 (COQ10) 100 MG CAPS Take 1 capsule by mouth 2 (two) times daily.   Yes [provider]  Elderberry 575 MG/5ML SYRP Take by mouth daily.   Yes [provider]  furosemide (LASIX) 40 MG tablet Take 40 mg by mouth daily.  12/01/11  Yes Einar Pheasant, MD  Garlic 2025 MG CAPS Take 1,000 mg by mouth daily.   Yes [provider]  isosorbide mononitrate (IMDUR) 60 MG 24 hr tablet Take 60-120 mg by mouth 2 (two) times daily. Take 120 mg by mouth in the morning and 60 mg by mouth in the evening.   Yes [provider]  KRILL OIL PO Take 2,000 mg by mouth daily.    Yes [provider]  losartan (COZAAR) 25 MG tablet Take 25 mg by mouth daily.    Yes [provider]  Magnesium 250 MG TABS Take 500 mg by mouth at bedtime.    Yes [provider]  Multiple Vitamin (MULTIVITAMIN WITH MINERALS) TABS tablet Take 1 tablet by mouth 2 (two) times daily.   Yes [provider]  nitroGLYCERIN (NITROSTAT) 0.4 MG SL tablet Place 1 tablet (0.4 mg total) under the tongue every 5 (five) minutes as needed for chest pain. 12/01/11  Yes Einar Pheasant, MD  OIL OF OREGANO PO Take 1,920 mg by mouth  daily as needed (SINUS).    Yes [provider]  Red Yeast Rice 600 MG CAPS Take 2 capsules by mouth 2 (two) times daily.   Yes [provider]  Saccharomyces boulardii (PROBIOTIC) 250 MG CAPS Take 1 capsule by mouth daily.   Yes [provider]  Sodium Chloride-Xylitol (XLEAR SINUS CARE SPRAY) SOLN Place 1 spray into the nose daily.   Yes [provider]  spironolactone (ALDACTONE) 25 MG tablet Take 1 tablet (25 mg total) by mouth daily. 08/06/17  Yes Alisa Graff, FNP  TURMERIC PO Take 2  tablets by mouth daily.    Yes [provider]  vitamin E (VITAMIN E) 400 UNIT capsule Take 400 Units by mouth daily.    Yes [provider]  zinc gluconate 50 MG tablet Take 50 mg by mouth daily.   Yes [provider]  atorvastatin (LIPITOR) 10 MG tablet Take 1 tablet (10 mg total) by mouth daily. Patient not taking: Reported on 12/15/2017 11/22/17 11/22/18  Algernon Huxley, MD  Cholecalciferol (DDROPS PO) Take 1 tablet by mouth.    [provider]  Multiple Vitamins-Minerals (AIRBORNE PO) Take by mouth.    [provider]  Multiple Vitamins-Minerals (ANTIOXIDANT FORMULA SG) capsule Take 1 capsule by mouth daily.    [provider]  Multiple Vitamins-Minerals (ANTIOXIDANT FORMULA SG) capsule Take 1 capsule by mouth daily.    [provider]      VITAL SIGNS:  Blood pressure 120/64, pulse 100, temperature (!) 97.2 F (36.2 C), temperature source Axillary, resp. rate 18, height 5\' 7"  (1.702 m), weight 68 kg, SpO2 96 %.  PHYSICAL EXAMINATION:  Physical Exam  GENERAL:  67 y.o.-year-old patient lying in the bed with no acute distress.  EYES: Pupils equal, round, reactive to light and accommodation. No scleral icterus. Extraocular muscles intact.  HEENT: Head atraumatic, normocephalic. Oropharynx and nasopharynx clear.  NECK:  Supple, no jugular venous distention. No thyroid enlargement, no tenderness.  LUNGS:  Bilateral rales, no wheezing or rhonchi or crepitation. No use of accessory muscles of respiration.  CARDIOVASCULAR: S1, S2 normal. No murmurs, rubs, or gallops.  ABDOMEN: Soft, nontender, nondistended. Bowel sounds present. No organomegaly or mass.  EXTREMITIES: No pedal edema, cyanosis, or clubbing.  NEUROLOGIC: Cranial nerves II through XII are intact. Muscle strength 5/5 in all extremities. Sensation intact. Gait not checked.  PSYCHIATRIC: The patient is alert and oriented x 3.  SKIN: No obvious rash, lesion, or ulcer.   LABORATORY PANEL:   CBC Recent Labs  Lab 12/15/17 1114 12/15/17 1125  WBC 9.8  --   HGB 11.7* 12.9  HCT 37.2 38.0  PLT 479*  --    ------------------------------------------------------------------------------------------------------------------  Chemistries  Recent Labs  Lab 12/15/17 1114 12/15/17 1125  NA 132* 131*  K 4.3 4.3  CL 96* 99  CO2 18*  --   GLUCOSE 360* 367*  BUN 13 11  CREATININE 1.13* 1.10*  CALCIUM 8.9  --   MG 2.6*  --    ------------------------------------------------------------------------------------------------------------------  Cardiac Enzymes Recent Labs  Lab 12/15/17 1114  TROPONINI <0.03   ------------------------------------------------------------------------------------------------------------------  RADIOLOGY:  Dg Chest Portable 1 View  Result Date: 12/15/2017 CLINICAL DATA:  Short of breath EXAM: PORTABLE CHEST 1 VIEW COMPARISON:  07/14/2017 FINDINGS: Mild progression of diffuse bilateral airspace disease consistent with edema. Progression of small bilateral effusions. Postop CABG. IMPRESSION: Mild progression of pulmonary edema and small pleural effusions. Electronically Signed   By: Franchot Gallo M.D.   On: 12/15/2017 11:22      IMPRESSION AND PLAN:   Acute respiratory failure with hypoxia due to acute on chronic systolic CHF. The patient will be admitted to stepdown unit. Continue BiPAP, DuoNeb every  6 hours, CHF protocol, start Lasix IV every 12 hours, continue Coreg, spironolactone, losartan and Imdur, cardiology consult.  Lactic acidosis.  Due to above.  Follow-up level.  Hyponatremia, possible due to fluid overload.  Follow-up BMP.  Hypertension.  Continue hypertension as well. Hyperglycemia, check hemoglobin A1c, start sliding scale. CAD and PVD.  Continue aspirin and Plavix.  Discussed with  ICU nurse practitioner Hinton Dyer. All the records are reviewed and case discussed with ED provider. Management plans discussed with the patient, her husband and they are in agreement.  CODE STATUS: Full code.  TOTAL TIME TAKING CARE OF THIS PATIENT: 42 minutes.    Demetrios Loll M.D on 12/15/2017 at 12:51 PM  Between 7am to 6pm - Pager - [phone]  After 6pm go to [email or web address] - Proofreader  [facility]  Office  [phone]  CC: Primary care physician; Einar Pheasant, MD   Note: This dictation was prepared with Dragon dictation along with smaller phrase technology. Any transcriptional errors that result from this process are unin

## 2017-12-16 ENCOUNTER — Inpatient Hospital Stay: Payer: Medicare Other

## 2017-12-16 LAB — COMPREHENSIVE METABOLIC PANEL
ALBUMIN: 4.5 g/dL (ref 3.5–5.0)
ALT: 14 U/L (ref 0–44)
ANION GAP: 16 — AB (ref 5–15)
AST: 17 U/L (ref 15–41)
Alkaline Phosphatase: 68 U/L (ref 38–126)
BILIRUBIN TOTAL: 1 mg/dL (ref 0.3–1.2)
BUN: 17 mg/dL (ref 8–23)
CHLORIDE: 95 mmol/L — AB (ref 98–111)
CO2: 25 mmol/L (ref 22–32)
Calcium: 9.7 mg/dL (ref 8.9–10.3)
Creatinine, Ser: 0.88 mg/dL (ref 0.44–1.00)
GFR calc Af Amer: >60 mL/min (ref >60)
GFR calc non Af Amer: >60 mL/min (ref >60)
GLUCOSE: 124 mg/dL — AB (ref 70–99)
POTASSIUM: 3.6 mmol/L (ref 3.5–5.1)
SODIUM: 136 mmol/L (ref 135–145)
TOTAL PROTEIN: 7.8 g/dL (ref 6.5–8.1)

## 2017-12-16 LAB — MAGNESIUM: MAGNESIUM: 2.1 mg/dL (ref 1.7–2.4)

## 2017-12-16 LAB — CBC
HCT: 34.1 % — ABNORMAL LOW (ref 36.0–46.0)
HEMOGLOBIN: 11.2 g/dL — AB (ref 12.0–15.0)
MCH: 30.1 pg (ref 26.0–34.0)
MCHC: 32.8 g/dL (ref 30.0–36.0)
MCV: 91.7 fL (ref 80.0–100.0)
NRBC: 0 % (ref 0.0–0.2)
PLATELETS: 345 10*3/uL (ref 150–400)
RBC: 3.72 MIL/uL — AB (ref 3.87–5.11)
RDW: 14.5 % (ref 11.5–15.5)
WBC: 10.6 10*3/uL — ABNORMAL HIGH (ref 4.0–10.5)

## 2017-12-16 LAB — BRAIN NATRIURETIC PEPTIDE: B Natriuretic Peptide: 1381 pg/mL — ABNORMAL HIGH (ref 0.0–100.0)

## 2017-12-16 LAB — GLUCOSE, CAPILLARY
GLUCOSE-CAPILLARY: 101 mg/dL — AB (ref 70–99)
GLUCOSE-CAPILLARY: 111 mg/dL — AB (ref 70–99)
Glucose-Capillary: 140 mg/dL — ABNORMAL HIGH (ref 70–99)
Glucose-Capillary: 127 mg/dL — ABNORMAL HIGH (ref 70–99)

## 2017-12-16 LAB — TROPONIN I
TROPONIN I: 0.11 ng/mL — AB (ref <0.03)
TROPONIN I: 0.1 ng/mL — AB (ref <0.03)

## 2017-12-16 MED ORDER — IPRATROPIUM-ALBUTEROL 0.5-2.5 (3) MG/3ML IN SOLN
3.0000 mL | Freq: Four times a day (QID) | RESPIRATORY_TRACT | Status: DC | PRN
  Administered 2017-12-16: 3 mL via RESPIRATORY_TRACT
  Filled 2017-12-16: qty 3

## 2017-12-16 MED ORDER — POTASSIUM CHLORIDE CRYS ER 20 MEQ PO TBCR
40.0000 meq | EXTENDED_RELEASE_TABLET | Freq: Once | ORAL | Status: AC
  Administered 2017-12-16: 40 meq via ORAL
  Filled 2017-12-16: qty 2

## 2017-12-16 MED ORDER — FAMOTIDINE 20 MG PO TABS
20.0000 mg | ORAL_TABLET | Freq: Every day | ORAL | Status: DC
  Administered 2017-12-16 – 2017-12-17 (×2): 20 mg via ORAL
  Filled 2017-12-16 (×2): qty 1

## 2017-12-16 MED ORDER — FUROSEMIDE 40 MG PO TABS
40.0000 mg | ORAL_TABLET | Freq: Two times a day (BID) | ORAL | Status: DC
  Administered 2017-12-16 – 2017-12-17 (×2): 40 mg via ORAL
  Filled 2017-12-16: qty 1
  Filled 2017-12-16: qty 2

## 2017-12-16 NOTE — Care Management Note (Signed)
Case Management Note  Patient Details  Name: Jennifer [name] MRN: [id number] Date of Birth: 08/07/1950  Subjective/Objective:      Patient admitted to the ICU for acute respiratory failure initially requiring Bipap.  Patient is currently sitting up in bed tolerating [place] at 2L.  Patient is from home and lives with her husband.  Patient reports that she is independent in ADL's, and she drives.  She has a cane at home but no other assistive devices- the cane was to help her after she had surgery on her leg.  Patient reports that she is active and participates at a gym with [employer].  Patient reports that cold weather seems to really affect her breathing and she thinks that is what happened yesterday when she had to come in to the ED.  Patient verifies PCP as Dr. Nicki Reaper, last seen in July- next appointment this upcoming Monday.  Patient is trying to get qualified for home oxygen, she thinks that having home O2 will help her, she currently does not have O2 at home.  Patient uses [facility] for prescriptions.  Patient has previously been seen in the HF clinic and she has an upcoming appointment on 12/23/17.  Patient does not qualify for [facility] she is not homebound.  RNCM will cont to follow to assess for additional needs at discharge. Doran Clay RN BSN [phone]                Action/Plan:   Expected Discharge Date:                  Expected Discharge Plan:  Home/Self Care  In-House Referral:     Discharge planning Services  CM Consult  Post Acute Care Choice:    Choice offered to:     DME Arranged:    DME Agency:     HH Arranged:    HH Agency:     Status of Service:  In process, will continue to follow  If discussed at Long Length of Stay Meetings, dates discussed:    Additional Comments:  Shelbie Hutching, RN 12/16/2017, 12:03 PM

## 2017-12-16 NOTE — Progress Notes (Signed)
SATURATION QUALIFICATIONS: (This note is used to comply with regulatory documentation for home oxygen)  Patient Saturations on Room Air at Rest = 96%  Patient Saturations on Room Air while Ambulating = 88%  Patient Saturations on 0 Liters of oxygen while Ambulating = 88%  Please briefly explain why patient needs home oxygen:Patient gets short of breath when ambulating for long periods of time.

## 2017-12-16 NOTE — Progress Notes (Signed)
Pt refused bipap, states she does not use one at home

## 2017-12-16 NOTE — Progress Notes (Signed)
Name: Jennifer [name] MRN: [id number] DOB: December 02, 1950    ADMISSION DATE:  12/15/2017  BRIEF PATIENT DESCRIPTION:  67 yo female admitted with acute on chronic hypoxic respiratory failure secondary to acute CHF exacerbation requiring Bipap   SIGNIFICANT EVENTS  11/13-Pt admitted to the stepdown unit on Bipap   STUDIES:  None   HISTORY OF PRESENT ILLNESS:   This is a 67 yo female with a PMH of Chronic Systolic CHF (Echo 68/01/7516 EF 40%-45%), COPD, HTN, PVD (underwent RLE angiogram with stent placement on 11/22/17), Hypercholesteremia, Endometriosis, and CAD s/p CABG. She presented to [facility] ER via EMS on 11/13 with c/o chest pain and shortness of breath.  Per ER notes the pt stated symptoms started several days prior to presentation, however due to increased in severity she proceeded to the ER. She states she does have a hx of chest spasms and when it is cold outside this triggers severe shortness of breath. Per EMS upon their arrival at pts home she was hypoxic with O2 sats in the 80's, she received 2 duoneb treatments en route to the ER. In the ER she was placed on Bipap.  CXR revealed pulmonary edema and EKG showed sinus tachycardia, left BBB, diffuse ST depression without ST elevation findings similar to previous EKG's.  Lab results revealed Na+ 132, CO2 18, glucose 360, creatinine 1.13, calcium 18, magnesium 2.6, BNP 1,536, troponin <0.03, and hgb 11.7.  She received 40 mg iv lasix x1 dose and was subsequently admitted to the stepdown unit for additional workup and treatment.   PAST MEDICAL HISTORY :   has a past medical history of CAD (coronary artery disease) (2003), Dyspnea, Endometriosis, Headache, Hypercholesteremia, Hypertension, PVD (peripheral vascular disease) ([place]), and Systolic CHF ([place]).  has a past surgical history that includes Coronary artery bypass graft; Abdominal hysterectomy; Breast surgery (2004); cataract surgery (2007); Colonoscopy with propofol (N/A, 10/30/2015); Vascular  surgery; Eye surgery; and Lower Extremity Angiography (Right, 11/22/2017).  REVIEW OF SYSTEMS: Positives in BOLD   Constitutional: Negative for fever, chills, weight loss, malaise/fatigue and diaphoresis.  HENT: Negative for hearing loss, ear pain, nosebleeds, congestion, sore throat, neck pain, tinnitus and ear discharge.   Eyes: Negative for blurred vision, double vision, photophobia, pain, discharge and redness.  Respiratory: Negative for cough, hemoptysis, sputum production, shortness of breath, wheezing and stridor.   Cardiovascular: Negative for chest pain, palpitations, orthopnea, claudication, leg swelling and PND.  Gastrointestinal: bloating, heartburn, nausea, vomiting, abdominal pain, diarrhea, constipation, blood in stool and melena.  Genitourinary: Negative for dysuria, urgency, frequency, hematuria and flank pain.  Musculoskeletal: Negative for myalgias, back pain, joint pain and falls.  Skin: Negative for itching and rash.  Neurological: Negative for dizziness, tingling, tremors, sensory change, speech change, focal weakness, seizures, loss of consciousness, weakness and headaches.  Endo/Heme/Allergies: Negative for environmental allergies and polydipsia. Does not bruise/bleed easily.  SUBJECTIVE:  Pt c/o bloating and gas   VITAL SIGNS: Temp:  [97.4 F (36.3 C)-97.9 F (36.6 C)] 97.6 F (36.4 C) (11/14 0750) Pulse Rate:  [64-103] 85 (11/14 0923) Resp:  [11-34] 34 (11/14 0800) BP: (93-128)/(38-73) 120/38 (11/14 0923) SpO2:  [92 %-100 %] 94 % (11/14 1100) Weight:  [70.5 kg-72.3 kg] 72.3 kg (11/14 0500)  PHYSICAL EXAMINATION: General: well developed, well nourished female, NAD  Neuro: alert and oriented, follows commands  HEENT: supple, no JVD  Cardiovascular: nsr with left BBB, no R/G Lungs: clear throughout, even, non labored  Abdomen: +BS x4, soft, non tender, non distended Musculoskeletal: normal  bulk and tone, no edema  Skin: intact no rashes or lesions    Recent Labs  Lab 12/15/17 1114 12/15/17 1125 12/16/17 0542  NA 132* 131* 136  K 4.3 4.3 3.6  CL 96* 99 95*  CO2 18*  --  25  BUN 13 11 17   CREATININE 1.13* 1.10* 0.88  GLUCOSE 360* 367* 124*   Recent Labs  Lab 12/15/17 1114 12/15/17 1125 12/16/17 0542  HGB 11.7* 12.9 11.2*  HCT 37.2 38.0 34.1*  WBC 9.8  --  10.6*  PLT 479*  --  345   Dg Chest Port 1 View  Result Date: 12/16/2017 CLINICAL DATA:  Shortness of breath EXAM: PORTABLE CHEST 1 VIEW COMPARISON:  12/15/2017 FINDINGS: Prior CABG. Cardiomegaly. Interstitial edema, improved since prior study. No confluent opacities. Suspect small effusions. IMPRESSION: Mild interstitial edema, improved since prior study. Suspect small effusions. Electronically Signed   By: Rolm Baptise M.D.   On: 12/16/2017 08:33   Dg Chest Portable 1 View  Result Date: 12/15/2017 CLINICAL DATA:  Short of breath EXAM: PORTABLE CHEST 1 VIEW COMPARISON:  07/14/2017 FINDINGS: Mild progression of diffuse bilateral airspace disease consistent with edema. Progression of small bilateral effusions. Postop CABG. IMPRESSION: Mild progression of pulmonary edema and small pleural effusions. Electronically Signed   By: Franchot Gallo M.D.   On: 12/15/2017 11:22    ASSESSMENT / PLAN:  Acute on chronic hypoxic respiratory failure secondary to acute CHF exacerbation  Prn Bipap for dyspnea and/or hypoxia  Scheduled and prn bronchodilator therapy Repeat CXR in am   Acute on chronic systolic CHF exacerbation  Mildly elevated troponin likely secondary to demand ischemia in setting of respiratory failure  Hx: HTN, Hypercholesteremia, PVD, and CAD Continuous telemetry monitoring Continue outpatient cardiac medications Continue lasix  Cardiology consulted appreciate input   Acute renal failure  Trend BMP and lactic acid  Replace electrolytes as indicated  Monitor UOP  Hyperglycemia CBG's ac/hs SSI   VTE px: subq lovenox SUP px: pepcid   Pt stable for  transfer to telemetry unit once bed becomes available PCCM will sign off if you need further assistance please call on call pager# listed in [place], [place] Pager [phone] (please enter 7 digits) PCCM Consult Pager [phone] (please enter 7 digits)

## 2017-12-16 NOTE — Progress Notes (Signed)
Pharmacy Electrolytes Consult Notes Pharmacy consulted for electrolytes replacement in this 39 YOF admitted from home on 11/13 for SOB and pulmonary edema.   Labs:  11/14 Na+ 136  K+ 3.6 Cl- 95 Ca2+ 9.7 Mg2+ 2.1 Albumin 4.5   Assessment/Plan Will replace with the goal of magnesium ~2, potassium ~4   Patient received furosemide 40 mg IV x 2. Last dose given on 11/14 @ 0328. Patient scheduled furosemide 40 mg PO BID. Patient has MAG-OX 400 mg PO QHS scheduled. Patient received KLOR-CON 40 mEq x 1 on 11/14 @ 1154. Patient taking spironolactione 25 mg PO Qday.   Will or electrolytes with AM labs.   Thank you for allowing pharmacy to be a part of this patient's care.  Suzzanne Cloud 12/16/2017,1:21 PM

## 2017-12-17 ENCOUNTER — Other Ambulatory Visit: Payer: Self-pay

## 2017-12-17 LAB — CBC WITH DIFFERENTIAL/PLATELET
Abs Immature Granulocytes: 0.03 10*3/uL (ref 0.00–0.07)
Basophils Absolute: 0 10*3/uL (ref 0.0–0.1)
Basophils Relative: 0 %
EOS PCT: 1 %
Eosinophils Absolute: 0 10*3/uL (ref 0.0–0.5)
HCT: 31.7 % — ABNORMAL LOW (ref 36.0–46.0)
Hemoglobin: 10.3 g/dL — ABNORMAL LOW (ref 12.0–15.0)
Immature Granulocytes: 0 %
Lymphocytes Relative: 26 %
Lymphs Abs: 2.2 10*3/uL (ref 0.7–4.0)
MCH: 30.2 pg (ref 26.0–34.0)
MCHC: 32.5 g/dL (ref 30.0–36.0)
MCV: 93 fL (ref 80.0–100.0)
MONO ABS: 0.5 10*3/uL (ref 0.1–1.0)
Monocytes Relative: 6 %
NEUTROS ABS: 5.5 10*3/uL (ref 1.7–7.7)
NRBC: 0 % (ref 0.0–0.2)
Neutrophils Relative %: 67 %
PLATELETS: 292 10*3/uL (ref 150–400)
RBC: 3.41 MIL/uL — AB (ref 3.87–5.11)
RDW: 14.8 % (ref 11.5–15.5)
WBC: 8.3 10*3/uL (ref 4.0–10.5)

## 2017-12-17 LAB — BASIC METABOLIC PANEL
Anion gap: 11 (ref 5–15)
BUN: 23 mg/dL (ref 8–23)
CHLORIDE: 101 mmol/L (ref 98–111)
CO2: 27 mmol/L (ref 22–32)
CREATININE: 0.89 mg/dL (ref 0.44–1.00)
Calcium: 9.3 mg/dL (ref 8.9–10.3)
GFR calc Af Amer: >60 mL/min (ref >60)
GFR calc non Af Amer: >60 mL/min (ref >60)
Glucose, Bld: 101 mg/dL — ABNORMAL HIGH (ref 70–99)
POTASSIUM: 4.2 mmol/L (ref 3.5–5.1)
SODIUM: 139 mmol/L (ref 135–145)

## 2017-12-17 LAB — MAGNESIUM: MAGNESIUM: 2.5 mg/dL — AB (ref 1.7–2.4)

## 2017-12-17 LAB — GLUCOSE, CAPILLARY
Glucose-Capillary: 87 mg/dL (ref 70–99)
Glucose-Capillary: 118 mg/dL — ABNORMAL HIGH (ref 70–99)

## 2017-12-17 NOTE — Discharge Summary (Signed)
[facility] [place] NAME: Jennifer [name]    MR#:  [id number]  DATE OF BIRTH:  Jan 26, 1951  DATE OF ADMISSION:  12/15/2017 ADMITTING PHYSICIAN: Demetrios Loll, MD  DATE OF DISCHARGE: 12/17/2017  PRIMARY CARE PHYSICIAN: Einar Pheasant, MD    ADMISSION DIAGNOSIS:  Acute respiratory failure with hypoxia ([place]) [J96.01] Acute on chronic congestive heart failure, unspecified heart failure type ([place]) [I50.9]  DISCHARGE DIAGNOSIS:  Active Problems:   Acute respiratory failure with hypoxia ([place])   Ac on ch systolic CHF  SECONDARY DIAGNOSIS:   Past Medical History:  Diagnosis Date  . CAD (coronary artery disease) 2003   s/p CABG x 5 (Dr Evelina Dun), MI- 2010  . Dyspnea   . Endometriosis   . Headache   . Hypercholesteremia   . Hypertension   . PVD (peripheral vascular disease) ([place])    s/p intervention (left) - 2004, right (2006)  . Systolic CHF ([place])    last EF 40%    HOSPITAL COURSE:   * Acute respiratory failure with hypoxia due to acute on chronic systolic CHF. Continue BiPAP, DuoNeb every 6 hours, CHF protocol, start Lasix IV every 12 hours, continue Coreg, spironolactone, losartan and Imdur, cardiology consult.  pt improved. Discharge home today  * Lactic acidosis. Due to above. Follow-up level.  * Hyponatremia, possible due to fluid overload. Follow-up BMP.  * Hypertension. Continue hypertension as well. * Hyperglycemia, check hemoglobin A1c, start sliding scale. * CAD and PVD. Continue aspirin and Plavix.  * episodic respi distress/ bronchospasm due to change in weather.   She was a smoker and may have underlying COPD/ Lung damage.    I have suggested to follow with [facility] in 1-2 weeks with help of PMD> DISCHARGE CONDITIONS:   Stable.  CONSULTS OBTAINED:  Treatment Team:  Yolonda Kida, MD  DRUG ALLERGIES:   Allergies  Allergen Reactions  . Cyclobenzaprine Other (See Comments)    Felt bad  .  Prednisone Other (See Comments)    Felt bad    DISCHARGE MEDICATIONS:   Allergies as of 12/17/2017      Reactions   Cyclobenzaprine Other (See Comments)   Felt bad   Prednisone Other (See Comments)   Felt bad      Medication List    TAKE these medications   AIRBORNE PO Take by mouth.   antioxidant formula SG capsule Take 1 capsule by mouth daily.   antioxidant formula SG capsule Take 1 capsule by mouth daily.   ALPRAZolam 0.5 MG tablet Commonly known as:  XANAX Take 1/2 - 1 tablet q hs prn. What changed:    how much to take  how to take this  when to take this  reasons to take this   aspirin EC 81 MG tablet Take 81 mg by mouth daily.   atorvastatin 10 MG tablet Commonly known as:  LIPITOR Take 1 tablet (10 mg total) by mouth daily.   carvedilol 3.125 MG tablet Commonly known as:  COREG Take 1 tablet (3.125 mg total) by mouth 2 (two) times daily with a meal.   clopidogrel 75 MG tablet Commonly known as:  PLAVIX Take 1 tablet (75 mg total) by mouth daily.   CoQ10 100 MG Caps Take 1 capsule by mouth 2 (two) times daily.   [employer] D3 50 MCG (2000 UT) Caps Generic drug:  Cholecalciferol Take 2,000 Units by mouth 2 (two) times daily.   DDROPS PO Take 1 tablet by  mouth.   Elderberry 575 MG/5ML Syrp Take by mouth daily.   furosemide 40 MG tablet Commonly known as:  LASIX Take 40 mg by mouth daily.   Garlic 6222 MG Caps Take 1,000 mg by mouth daily.   isosorbide mononitrate 60 MG 24 hr tablet Commonly known as:  IMDUR Take 60-120 mg by mouth 2 (two) times daily. Take 120 mg by mouth in the morning and 60 mg by mouth in the evening.   KRILL OIL PO Take 2,000 mg by mouth daily.   losartan 25 MG tablet Commonly known as:  COZAAR Take 25 mg by mouth daily.   Magnesium 250 MG Tabs Take 500 mg by mouth at bedtime.   multivitamin with minerals Tabs tablet Take 1 tablet by mouth 2 (two) times daily.   nitroGLYCERIN 0.4 MG SL tablet Commonly known  as:  NITROSTAT Place 1 tablet (0.4 mg total) under the tongue every 5 (five) minutes as needed for chest pain.   OIL OF OREGANO PO Take 1,920 mg by mouth daily as needed (SINUS).   Probiotic 250 MG Caps Take 1 capsule by mouth daily.   Red Yeast Rice 600 MG Caps Take 2 capsules by mouth 2 (two) times daily.   spironolactone 25 MG tablet Commonly known as:  ALDACTONE Take 1 tablet (25 mg total) by mouth daily.   TURMERIC PO Take 2 tablets by mouth daily.   vitamin C 1000 MG tablet Take 1,000 mg by mouth 2 (two) times daily.   vitamin E 400 UNIT capsule Generic drug:  vitamin E Take 400 Units by mouth daily.   XLEAR SINUS CARE SPRAY Soln Place 1 spray into the nose daily.   zinc gluconate 50 MG tablet Take 50 mg by mouth daily.        DISCHARGE INSTRUCTIONS:    Follow with pulm clinic.  If you experience worsening of your admission symptoms, develop shortness of breath, life threatening emergency, suicidal or homicidal thoughts you must seek medical attention immediately by calling 911 or calling your MD immediately  if symptoms less severe.  You Must read complete instructions/literature along with all the possible adverse reactions/side effects for all the Medicines you take and that have been prescribed to you. Take any new Medicines after you have completely understood and accept all the possible adverse reactions/side effects.   Please note  You were cared for by a hospitalist during your hospital stay. If you have any questions about your discharge medications or the care you received while you were in the hospital after you are discharged, you can call the unit and asked to speak with the hospitalist on call if the hospitalist that took care of you is not available. Once you are discharged, your primary care physician will handle any further medical issues. Please note that NO REFILLS for any discharge medications will be authorized once you are discharged, as it  is imperative that you return to your primary care physician (or establish a relationship with a primary care physician if you do not have one) for your aftercare needs so that they can reassess your need for medications and monitor your lab values.    Today   CHIEF COMPLAINT:   Chief Complaint  Patient presents with  . Respiratory Distress    HISTORY OF PRESENT ILLNESS:  Jennifer [name]  is a 67 y.o. female with a known history of CAD, chronic systolic CHF, hypertension, hyperlipidemia, PVD and endometriosis.  The patient has had cough and shortness  of breath for the past few day, which has been worsening since this morning.  She denies any fever or chills, no chest pain or palpitation or leg swelling.  She denies orthopnea or nocturnal dyspnea.  She was found in respiratory distress with O2 saturation down to 80s and put on BiPAP in the ED.  Chest x-ray show pulmonary edema.  She is treated with DuoNeb and Lasix in the ED.   VITAL SIGNS:  Blood pressure (!) 113/43, pulse 73, temperature (!) 97.5 F (36.4 C), temperature source Oral, resp. rate 16, height 5\' 8"  (1.727 m), weight 69.4 kg, SpO2 96 %.  I/O:    Intake/Output Summary (Last 24 hours) at 12/17/2017 1156 Last data filed at 12/17/2017 0958 Gross per 24 hour  Intake 1200 ml  Output 3650 ml  Net -2450 ml    PHYSICAL EXAMINATION:  GENERAL:  67 y.o.-year-old patient lying in the bed with no acute distress.  EYES: Pupils equal, round, reactive to light and accommodation. No scleral icterus. Extraocular muscles intact.  HEENT: Head atraumatic, normocephalic. Oropharynx and nasopharynx clear.  NECK:  Supple, no jugular venous distention. No thyroid enlargement, no tenderness.  LUNGS: Normal breath sounds bilaterally, no wheezing, rales,rhonchi or crepitation. No use of accessory muscles of respiration.  CARDIOVASCULAR: S1, S2 normal. No murmurs, rubs, or gallops.  ABDOMEN: Soft, non-tender, non-distended. Bowel sounds present.  No organomegaly or mass.  EXTREMITIES: No pedal edema, cyanosis, or clubbing.  NEUROLOGIC: Cranial nerves II through XII are intact. Muscle strength 5/5 in all extremities. Sensation intact. Gait not checked.  PSYCHIATRIC: The patient is alert and oriented x 3.  SKIN: No obvious rash, lesion, or ulcer.   DATA REVIEW:   CBC Recent Labs  Lab 12/17/17 0325  WBC 8.3  HGB 10.3*  HCT 31.7*  PLT 292    Chemistries  Recent Labs  Lab 12/16/17 0542 12/17/17 0325  NA 136 139  K 3.6 4.2  CL 95* 101  CO2 25 27  GLUCOSE 124* 101*  BUN 17 23  CREATININE 0.88 0.89  CALCIUM 9.7 9.3  MG 2.1 2.5*  AST 17  --   ALT 14  --   ALKPHOS 68  --   BILITOT 1.0  --     Cardiac Enzymes Recent Labs  Lab 12/16/17 0542  TROPONINI 0.10*    Microbiology Results  Results for orders placed or performed during the hospital encounter of 12/15/17  MRSA PCR Screening     Status: None   Collection Time: 12/15/17  3:06 PM  Result Value Ref Range Status   MRSA by PCR NEGATIVE NEGATIVE Final    Comment:        The GeneXpert MRSA Assay (FDA approved for NASAL specimens only), is one component of a comprehensive MRSA colonization surveillance program. It is not intended to diagnose MRSA infection nor to guide or monitor treatment for MRSA infections. Performed at [facility], [address] [place], [place] [address]     RADIOLOGY:  Dg Chest Port 1 View  Result Date: 12/16/2017 CLINICAL DATA:  Shortness of breath EXAM: PORTABLE CHEST 1 VIEW COMPARISON:  12/15/2017 FINDINGS: Prior CABG. Cardiomegaly. Interstitial edema, improved since prior study. No confluent opacities. Suspect small effusions. IMPRESSION: Mild interstitial edema, improved since prior study. Suspect small effusions. Electronically Signed   By: Rolm Baptise M.D.   On: 12/16/2017 08:33    EKG:   Orders placed or performed during the hospital encounter of 12/15/17  . ED EKG  .  ED EKG  . EKG 12-Lead  . EKG  12-Lead      Management plans discussed with the patient, family and they are in agreement.  CODE STATUS: Full.    Code Status Orders  (From admission, onward)         Start     Ordered   12/15/17 1504  Full code  Continuous     12/15/17 1503        Code Status History    Date Active Date Inactive Code Status Order ID Comments User Context   11/22/2017 0916 11/22/2017 1417 Full Code [id number]  Algernon Huxley, MD Inpatient   07/14/2017 1310 07/15/2017 1555 Full Code [id number]  Gladstone Lighter, MD Inpatient   05/18/2017 0147 05/19/2017 1739 Full Code [id number]  Saundra Shelling, MD Inpatient      TOTAL TIME TAKING CARE OF THIS PATIENT: 35 minutes.    Vaughan Basta M.D on 12/17/2017 at 11:56 AM  Between 7am to 6pm - Pager - [phone]  After 6pm go to [email or web address] - password [email or web address] [facility]  Office  [phone]  CC: Primary care physician; Einar Pheasant, MD   Note: This dictation was prepared with Dragon dictation along with smaller phrase technology. Any transcriptional errors that result from this process are unintentional.

## 2017-12-17 NOTE — Progress Notes (Signed)
Heart Failure Information Packet and discharge paperwork reviewed with patient.  All questions answered.  Pt states no further questions or concerns at this time.

## 2017-12-17 NOTE — Progress Notes (Signed)
Pt discharged via wheelchair home with spouse.

## 2017-12-17 NOTE — Plan of Care (Signed)

## 2017-12-17 NOTE — Care Management Note (Signed)
Case Management Note  Patient Details  Name: Jennifer [name] MRN: [id number] Date of Birth: 1950-12-27  Subjective/Objective:    RNCM assessment and consult completed in the ICU.  Patient is on room air and feeling much better.  She is hoping to discharge today. Heart Failure clinic appointment made.  Has a functioning scale at home and weighs daily.  Independent in all adls, denies issues accessing medical care, obtaining medications or with transportation.  Current with PCP.  No discharge needs identified at present by care manager or members of care team.           Action/Plan:   Expected Discharge Date:                  Expected Discharge Plan:  Home/Self Care  In-House Referral:     Discharge planning Services  CM Consult  Post Acute Care Choice:    Choice offered to:     DME Arranged:    DME Agency:     HH Arranged:    [facility] Agency:     Status of Service:  Completed, signed off  If discussed at [employer] of Stay Meetings, dates discussed:    Additional Comments:  Elza Rafter, RN 12/17/2017, 9:45 AM

## 2017-12-17 NOTE — Plan of Care (Signed)
  Problem: Education: Goal: Knowledge of General Education information will improve Description Including pain rating scale, medication(s)/side effects and non-pharmacologic comfort measures Outcome: Progressing   Problem: Health Behavior/Discharge Planning: Goal: Ability to manage health-related needs will improve 12/17/2017 0053 by Liliane Channel, RN Outcome: Progressing 12/17/2017 0053 by Liliane Channel, RN Outcome: Progressing   Problem: Activity: Goal: Risk for activity intolerance will decrease Outcome: Progressing   Problem: Safety: Goal: Ability to remain free from injury will improve 12/17/2017 0053 by Liliane Channel, RN Outcome: Progressing 12/17/2017 0053 by Liliane Channel, RN Outcome: Progressing

## 2017-12-17 NOTE — Care Management Important Message (Signed)
Copy of signed IM left with patient in room.  

## 2017-12-17 NOTE — Progress Notes (Signed)
[facility] [place] NAME: Jennifer [name]    MR#:  [id number]  DATE OF BIRTH:  1950-12-23  SUBJECTIVE:  CHIEF COMPLAINT:   Chief Complaint  Patient presents with  . Respiratory Distress   Came with respi distress, was on bipap on arrival. Improved after IV lasix and on nasal canula oxygen. N0 pain. REVIEW OF SYSTEMS:  CONSTITUTIONAL: No fever, fatigue or weakness.  EYES: No blurred or double vision.  EARS, NOSE, AND THROAT: No tinnitus or ear pain.  RESPIRATORY: No cough, shortness of breath, wheezing or hemoptysis.  CARDIOVASCULAR: No chest pain, orthopnea, edema.  GASTROINTESTINAL: No nausea, vomiting, diarrhea or abdominal pain.  GENITOURINARY: No dysuria, hematuria.  ENDOCRINE: No polyuria, nocturia,  HEMATOLOGY: No anemia, easy bruising or bleeding SKIN: No rash or lesion. MUSCULOSKELETAL: No joint pain or arthritis.   NEUROLOGIC: No tingling, numbness, weakness.  PSYCHIATRY: No anxiety or depression.   ROS  DRUG ALLERGIES:   Allergies  Allergen Reactions  . Cyclobenzaprine Other (See Comments)    Felt bad  . Prednisone Other (See Comments)    Felt bad    VITALS:  Blood pressure (!) 113/43, pulse 73, temperature (!) 97.5 F (36.4 C), temperature source Oral, resp. rate 16, height 5\' 8"  (1.727 m), weight 69.4 kg, SpO2 96 %.  PHYSICAL EXAMINATION:  GENERAL:  67 y.o.-year-old patient lying in the bed with no acute distress.  EYES: Pupils equal, round, reactive to light and accommodation. No scleral icterus. Extraocular muscles intact.  HEENT: Head atraumatic, normocephalic. Oropharynx and nasopharynx clear.  NECK:  Supple, no jugular venous distention. No thyroid enlargement, no tenderness.  LUNGS: Normal breath sounds bilaterally, no wheezing, some crepitation. No use of accessory muscles of respiration.  CARDIOVASCULAR: S1, S2 normal. No murmurs, rubs, or gallops.  ABDOMEN: Soft, nontender, nondistended. Bowel sounds  present. No organomegaly or mass.  EXTREMITIES: No pedal edema, cyanosis, or clubbing.  NEUROLOGIC: Cranial nerves II through XII are intact. Muscle strength 5/5 in all extremities. Sensation intact. Gait not checked.  PSYCHIATRIC: The patient is alert and oriented x 3.  SKIN: No obvious rash, lesion, or ulcer.   Physical Exam LABORATORY PANEL:   CBC Recent Labs  Lab 12/17/17 0325  WBC 8.3  HGB 10.3*  HCT 31.7*  PLT 292   ------------------------------------------------------------------------------------------------------------------  Chemistries  Recent Labs  Lab 12/16/17 0542 12/17/17 0325  NA 136 139  K 3.6 4.2  CL 95* 101  CO2 25 27  GLUCOSE 124* 101*  BUN 17 23  CREATININE 0.88 0.89  CALCIUM 9.7 9.3  MG 2.1 2.5*  AST 17  --   ALT 14  --   ALKPHOS 68  --   BILITOT 1.0  --    ------------------------------------------------------------------------------------------------------------------  Cardiac Enzymes Recent Labs  Lab 12/15/17 2322 12/16/17 0542  TROPONINI 0.11* 0.10*   ------------------------------------------------------------------------------------------------------------------  RADIOLOGY:  Dg Chest Port 1 View  Result Date: 12/16/2017 CLINICAL DATA:  Shortness of breath EXAM: PORTABLE CHEST 1 VIEW COMPARISON:  12/15/2017 FINDINGS: Prior CABG. Cardiomegaly. Interstitial edema, improved since prior study. No confluent opacities. Suspect small effusions. IMPRESSION: Mild interstitial edema, improved since prior study. Suspect small effusions. Electronically Signed   By: Rolm Baptise M.D.   On: 12/16/2017 08:33    ASSESSMENT AND PLAN:   Active Problems:   Acute respiratory failure with hypoxia (HCC)  * Acute respiratory failure with hypoxia due to acute on chronic systolic CHF. Continue BiPAP, DuoNeb every 6 hours, CHF protocol, start Lasix  IV every 12 hours, continue Coreg, spironolactone, losartan and Imdur, cardiology consult.  * Lactic  acidosis.  Due to above.  Follow-up level.  * Hyponatremia, possible due to fluid overload.  Follow-up BMP.  * Hypertension.  Continue hypertension as well. * Hyperglycemia, check hemoglobin A1c, start sliding scale. * CAD and PVD.  Continue aspirin and Plavix.    All the records are reviewed and case discussed with Care Management/Social Workerr. Management plans discussed with the patient, family and they are in agreement.  CODE STATUS: Full.  TOTAL TIME TAKING CARE OF THIS PATIENT: 35 minutes.     POSSIBLE D/C IN 1-2 DAYS, DEPENDING ON CLINICAL CONDITION.   Vaughan Basta M.D on 12/17/2017   Between 7am to 6pm - Pager - [phone]  After 6pm go to [email or web address] - password [email or web address] [facility]  Office  [phone]  CC: Primary care physician; Einar Pheasant, MD  Note: This dictation was prepared with Dragon dictation along with smaller phrase technology. Any transcriptional errors that result from this process are unintentional.

## 2017-12-17 NOTE — Progress Notes (Signed)
PHARMACY CONSULT NOTE - FOLLOW UP  Pharmacy Consult for Electrolyte Monitoring and Replacement   Recent Labs: Potassium (mmol/L)  Date Value  12/17/2017 4.2  02/06/2013 3.8   Magnesium (mg/dL)  Date Value  12/17/2017 2.5 (H)  02/04/2013 2.2   Calcium (mg/dL)  Date Value  12/17/2017 9.3   Calcium, Total (mg/dL)  Date Value  02/06/2013 10.0   Albumin (g/dL)  Date Value  12/16/2017 4.5    Assessment: Patient received furosemide 40 mg IV x 2. Last dose given on 11/14 @ 0328. Patient scheduled furosemide 40 mg PO BID. Patient has MAG-OX 400 mg PO QHS scheduled. Patient received KLOR-CON 40 mEq x 1 on 11/14 @ 1154. Patient taking spironolactione 25 mg PO daily.  Plan:  Will discontinue HS magnesium oxide. Because this consult was initiated in CCU will sign off for now  Dallie Piles ,PharmD Clinical Pharmacist 12/17/2017 8:54 AM

## 2017-12-20 ENCOUNTER — Encounter: Payer: Self-pay | Admitting: Internal Medicine

## 2017-12-20 ENCOUNTER — Telehealth: Payer: Self-pay | Admitting: Internal Medicine

## 2017-12-20 ENCOUNTER — Ambulatory Visit (INDEPENDENT_AMBULATORY_CARE_PROVIDER_SITE_OTHER): Payer: Medicare Other | Admitting: Internal Medicine

## 2017-12-20 VITALS — BP 94/72 | HR 79 | Temp 97.9°F | Resp 18 | Wt 159.2 lb

## 2017-12-20 DIAGNOSIS — I6523 Occlusion and stenosis of bilateral carotid arteries: Secondary | ICD-10-CM | POA: Diagnosis not present

## 2017-12-20 DIAGNOSIS — E7800 Pure hypercholesterolemia, unspecified: Secondary | ICD-10-CM | POA: Diagnosis not present

## 2017-12-20 DIAGNOSIS — I70213 Atherosclerosis of native arteries of extremities with intermittent claudication, bilateral legs: Secondary | ICD-10-CM | POA: Diagnosis not present

## 2017-12-20 DIAGNOSIS — I5022 Chronic systolic (congestive) heart failure: Secondary | ICD-10-CM | POA: Diagnosis not present

## 2017-12-20 DIAGNOSIS — I10 Essential (primary) hypertension: Secondary | ICD-10-CM | POA: Diagnosis not present

## 2017-12-20 DIAGNOSIS — D649 Anemia, unspecified: Secondary | ICD-10-CM | POA: Insufficient documentation

## 2017-12-20 DIAGNOSIS — I2510 Atherosclerotic heart disease of native coronary artery without angina pectoris: Secondary | ICD-10-CM

## 2017-12-20 DIAGNOSIS — J9601 Acute respiratory failure with hypoxia: Secondary | ICD-10-CM | POA: Diagnosis not present

## 2017-12-20 LAB — COMPREHENSIVE METABOLIC PANEL
ALT: 17 U/L (ref 0–35)
AST: 14 U/L (ref 0–37)
Albumin: 4.5 g/dL (ref 3.5–5.2)
Alkaline Phosphatase: 65 U/L (ref 39–117)
BILIRUBIN TOTAL: 0.4 mg/dL (ref 0.2–1.2)
BUN: 12 mg/dL (ref 6–23)
CO2: 28 meq/L (ref 19–32)
CREATININE: 0.93 mg/dL (ref 0.40–1.20)
Calcium: 9.7 mg/dL (ref 8.4–10.5)
Chloride: 98 mEq/L (ref 96–112)
GFR: 63.85 mL/min (ref >60.00)
GLUCOSE: 97 mg/dL (ref 70–99)
Potassium: 4.1 mEq/L (ref 3.5–5.1)
Sodium: 136 mEq/L (ref 135–145)
Total Protein: 7.3 g/dL (ref 6.0–8.3)

## 2017-12-20 LAB — CBC WITH DIFFERENTIAL/PLATELET
BASOS ABS: 0.1 10*3/uL (ref 0.0–0.1)
Basophils Relative: 0.9 % (ref 0.0–3.0)
EOS ABS: 0.2 10*3/uL (ref 0.0–0.7)
Eosinophils Relative: 2.2 % (ref 0.0–5.0)
HCT: 34.2 % — ABNORMAL LOW (ref 36.0–46.0)
Hemoglobin: 11.6 g/dL — ABNORMAL LOW (ref 12.0–15.0)
Lymphocytes Relative: 25.1 % (ref 12.0–46.0)
Lymphs Abs: 1.7 10*3/uL (ref 0.7–4.0)
MCHC: 34 g/dL (ref 30.0–36.0)
MCV: 91 fl (ref 78.0–100.0)
MONOS PCT: 9.2 % (ref 3.0–12.0)
Monocytes Absolute: 0.6 10*3/uL (ref 0.1–1.0)
NEUTROS ABS: 4.3 10*3/uL (ref 1.4–7.7)
Neutrophils Relative %: 62.6 % (ref 43.0–77.0)
Platelets: 315 10*3/uL (ref 150.0–400.0)
RBC: 3.76 Mil/uL — ABNORMAL LOW (ref 3.87–5.11)
RDW: 15.1 % (ref 11.5–15.5)
WBC: 6.8 10*3/uL (ref 4.0–10.5)

## 2017-12-20 LAB — IBC PANEL
Iron: 55 ug/dL (ref 42–145)
Saturation Ratios: 14.6 % — ABNORMAL LOW (ref 20.0–50.0)
TRANSFERRIN: 270 mg/dL (ref 212.0–360.0)

## 2017-12-20 LAB — FERRITIN: Ferritin: 136.6 ng/mL (ref 10.0–291.0)

## 2017-12-20 MED ORDER — ALBUTEROL SULFATE HFA 108 (90 BASE) MCG/ACT IN AERS: 2.0000 | INHALATION_SPRAY | Freq: Four times a day (QID) | RESPIRATORY_TRACT | 1 refills | Status: DC | PRN

## 2017-12-20 NOTE — Telephone Encounter (Signed)
Transition Care Management Follow-up Telephone Call  How have you been since you were released from the hospital? Patient feels she needs to see lung specialist.   Do you understand why you were in the hospital? yes   Do you understand the discharge instrcutions? yes  Items Reviewed:  Medications reviewed: yes  Allergies reviewed: yes  Dietary changes reviewed: yes  Referrals reviewed: yes   Functional Questionnaire:   Activities of Daily Living (ADLs):   She states they are independent in the following: ambulation, bathing and hygiene, feeding, continence, grooming, toileting and dressing States they require assistance with the following: grooming and No help required with ADLs   Any transportation issues/concerns?: no   Any patient concerns? Yes, feels she needs lung specialist.   Confirmed importance and date/time of follow-up visits scheduled: yes   Confirmed with patient if condition begins to worsen call PCP or go to the ER.  Patient was given the Call-a-Nurse line [phone]: yes

## 2017-12-20 NOTE — Progress Notes (Signed)
[name], female   DOB: 09-12-1950, 67 y.o.   MRN: [id number]   Subjective:    [name], female    DOB: 04/28/1950, 67 y.o.   MRN: [id number]  HPI  Patient here for hospital follow up. She was just admitted 12/15/17 and diagnosed with acute respiratory failure with hypoxia and acute on chronic systolic CHF.  Was placed on BiPAP and started on duoneb.  Was started on IV lasix.  Remains on coreg, spironolactone, losartan and imdur.  Also had episodic respiratory distress/bronchospasm due to change in weather.  Previous smoker.  Recommended f/u in pulmonary clinic.  She reports she is doing better.  Breathing better.  Taking lasix.  Weighing herself regularly.  Eating.  Trying to watch her diet.  No nausea or vomiting.  Bowels moving.  Legs stable.     Past Medical History:  Diagnosis Date  . CAD (coronary artery disease) 2003   s/p CABG x 5 (Dr Evelina Dun), MI- 2010  . Dyspnea   . Endometriosis   . Headache   . Hypercholesteremia   . Hypertension   . PVD (peripheral vascular disease) ([place])    s/p intervention (left) - 2004, right (2006)  . Systolic CHF ([place])    last EF 40%   Past Surgical History:  Procedure Laterality Date  . ABDOMINAL HYSTERECTOMY    . BREAST SURGERY  2004   cyst removed  . cataract surgery  2007   left  . COLONOSCOPY WITH PROPOFOL N/A 10/30/2015   Procedure: COLONOSCOPY WITH PROPOFOL;  Surgeon: Robert Bellow, MD;  Location: [facility] ENDOSCOPY;  Service: Endoscopy;  Laterality: N/A;  . CORONARY ARTERY BYPASS GRAFT     2003  . EYE SURGERY     Cataract  . LOWER EXTREMITY ANGIOGRAPHY Right 11/22/2017   Procedure: LOWER EXTREMITY ANGIOGRAPHY;  Surgeon: Algernon Huxley, MD;  Location: [facility] CV LAB;  Service: Cardiovascular;  Laterality: Right;  [facility] VASCULAR SURGERY     Family History  Problem Relation Age of Onset  . Heart disease Mother   . CVA Mother   . Heart disease Father        CABG  . Heart disease Unknown        aunts  and uncles  . Diabetes Sister   . Breast cancer Neg Hx   . Colon cancer Neg Hx    Social History   Socioeconomic History  . Marital status: Married    Spouse name: Not on file  . Number of children: Not on file  . Years of education: Not on file  . Highest education level: Not on file  Occupational History  . Not on file  Social Needs  . Financial resource strain: Not hard at all  . Food insecurity:    Worry: Never true    Inability: Never true  . Transportation needs:    Medical: No    Non-medical: No  Tobacco Use  . Smoking status: Former Smoker    Last attempt to quit: 09/03/2008    Years since quitting: 9.3  . Smokeless tobacco: Never Used  Substance and Sexual Activity  . Alcohol use: Yes    Alcohol/week: 0.0 standard drinks    Comment: once a year  . Drug use: No  . Sexual activity: Not Currently  Lifestyle  . Physical activity:    Days per week: 3 days    Minutes per session: 60 min  . Stress: Not at all  Relationships  .  Social connections:    Talks on phone: Not on file    Gets together: Not on file    Attends religious service: Not on file    Active member of club or organization: Not on file    Attends meetings of clubs or organizations: Not on file    Relationship status: Married  Other Topics Concern  . Not on file  Social History Narrative   Lives at home with husband, independent at baseline    Outpatient Encounter Medications as of 12/20/2017  Medication Sig  . albuterol (PROVENTIL HFA;VENTOLIN HFA) 108 (90 Base) MCG/ACT inhaler Inhale 2 puffs into the lungs every 6 (six) hours as needed for wheezing or shortness of breath.  . ALPRAZolam (XANAX) 0.5 MG tablet Take 1/2 - 1 tablet q hs prn. (Patient taking differently: Take 0.25-0.5 mg by mouth at bedtime as needed. Take 1/2 - 1 tablet q hs prn.)  . Ascorbic Acid (VITAMIN C) 1000 MG tablet Take 1,000 mg by mouth 2 (two) times daily.   Marland Kitchen aspirin EC 81 MG tablet Take 81 mg by mouth daily.  .  carvedilol (COREG) 3.125 MG tablet Take 1 tablet (3.125 mg total) by mouth 2 (two) times daily with a meal.  . Cholecalciferol ([employer] D3) 2000 units CAPS Take 2,000 Units by mouth 2 (two) times daily.  . Cholecalciferol (DDROPS PO) Take 1 tablet by mouth.  . clopidogrel (PLAVIX) 75 MG tablet Take 1 tablet (75 mg total) by mouth daily.  . Coenzyme Q10 (COQ10) 100 MG CAPS Take 1 capsule by mouth 2 (two) times daily.  Kendall Flack 575 MG/5ML SYRP Take by mouth daily.  . Garlic 0938 MG CAPS Take 1,000 mg by mouth daily.  . isosorbide mononitrate (IMDUR) 60 MG 24 hr tablet Take 60-120 mg by mouth 2 (two) times daily. Take 120 mg by mouth in the morning and 60 mg by mouth in the evening.  Marland Kitchen KRILL OIL PO Take 2,000 mg by mouth daily.   Marland Kitchen losartan (COZAAR) 25 MG tablet Take 25 mg by mouth daily.   . Magnesium 250 MG TABS Take 500 mg by mouth at bedtime.   . Multiple Vitamin (MULTIVITAMIN WITH MINERALS) TABS tablet Take 1 tablet by mouth 2 (two) times daily.  . Multiple Vitamins-Minerals (AIRBORNE PO) Take by mouth.  . Multiple Vitamins-Minerals (ANTIOXIDANT FORMULA SG) capsule Take 1 capsule by mouth daily.  . Multiple Vitamins-Minerals (ANTIOXIDANT FORMULA SG) capsule Take 1 capsule by mouth daily.  . nitroGLYCERIN (NITROSTAT) 0.4 MG SL tablet Place 1 tablet (0.4 mg total) under the tongue every 5 (five) minutes as needed for chest pain.  . OIL OF OREGANO PO Take 1,920 mg by mouth daily as needed (SINUS).   . Red Yeast Rice 600 MG CAPS Take 2 capsules by mouth 2 (two) times daily.  . Saccharomyces boulardii (PROBIOTIC) 250 MG CAPS Take 1 capsule by mouth daily.  . Sodium Chloride-Xylitol (XLEAR SINUS CARE SPRAY) SOLN Place 1 spray into the nose daily.  Marland Kitchen spironolactone (ALDACTONE) 25 MG tablet Take 1 tablet (25 mg total) by mouth daily.  . TURMERIC PO Take 2 tablets by mouth daily.   . vitamin E (VITAMIN E) 400 UNIT capsule Take 400 Units by mouth daily.   Marland Kitchen zinc gluconate 50 MG tablet Take 50 mg by  mouth daily.  . [DISCONTINUED] atorvastatin (LIPITOR) 10 MG tablet Take 1 tablet (10 mg total) by mouth daily. (Patient not taking: Reported on 12/15/2017)  . [DISCONTINUED] furosemide (LASIX) 40 MG  tablet Take 40 mg by mouth daily.    No facility-administered encounter medications on file as of 12/20/2017.     Review of Systems  Constitutional: Negative for appetite change and unexpected weight change.  HENT: Negative for congestion and sinus pressure.   Respiratory: Negative for chest tightness.        Breathing is better.    Cardiovascular: Negative for palpitations and leg swelling.       No chest pain currently.    Gastrointestinal: Negative for abdominal pain, diarrhea, nausea and vomiting.  Genitourinary: Negative for difficulty urinating and dysuria.  Musculoskeletal: Negative for joint swelling and myalgias.  Skin: Negative for color change and rash.  Neurological: Negative for dizziness, light-headedness and headaches.  Psychiatric/Behavioral: Negative for agitation and dysphoric mood.       Objective:     Blood pressure rechecked by me: 110/72  Physical Exam  Constitutional: She appears well-developed and well-nourished. No distress.  HENT:  Nose: Nose normal.  Mouth/Throat: Oropharynx is clear and moist.  Neck: Neck supple. No thyromegaly present.  Cardiovascular: Normal rate and regular rhythm.  Pulmonary/Chest: Breath sounds normal. No respiratory distress. She has no wheezes.  Abdominal: Soft. Bowel sounds are normal. There is no tenderness.  Musculoskeletal: She exhibits no edema or tenderness.  Lymphadenopathy:    She has no cervical adenopathy.  Skin: No rash noted. No erythema.  Psychiatric: She has a normal mood and affect. Her behavior is normal.    BP 94/72 (BP Location: Left Arm, Patient Position: Sitting, Cuff Size: Normal)   Pulse 79   Temp 97.9 F (36.6 C) (Oral)   Resp 18   Wt 159 lb 3.2 oz (72.2 kg)   SpO2 97%   BMI 24.21 kg/m  Wt  Readings from Last 3 Encounters:  12/20/17 159 lb 3.2 oz (72.2 kg)  12/17/17 153 lb (69.4 kg)  11/22/17 160 lb 15 oz (73 kg)     Lab Results  Component Value Date   WBC 6.8 12/20/2017   HGB 11.6 (L) 12/20/2017   HCT 34.2 (L) 12/20/2017   PLT 315.0 12/20/2017   GLUCOSE 97 12/20/2017   CHOL 256 (H) 05/25/2017   TRIG 320.0 (H) 05/25/2017   HDL 45.30 05/25/2017   LDLDIRECT 161.0 05/25/2017   LDLCALC 157 07/24/2015   ALT 17 12/20/2017   AST 14 12/20/2017   NA 136 12/20/2017   K 4.1 12/20/2017   CL 98 12/20/2017   CREATININE 0.93 12/20/2017   BUN 12 12/20/2017   CO2 28 12/20/2017   TSH 1.55 05/25/2017   INR 1.00 05/18/2017   HGBA1C 5.3 12/15/2017    Dg Chest Port 1 View  Result Date: 12/16/2017 CLINICAL DATA:  Shortness of breath EXAM: PORTABLE CHEST 1 VIEW COMPARISON:  12/15/2017 FINDINGS: Prior CABG. Cardiomegaly. Interstitial edema, improved since prior study. No confluent opacities. Suspect small effusions. IMPRESSION: Mild interstitial edema, improved since prior study. Suspect small effusions. Electronically Signed   By: Rolm Baptise M.D.   On: 12/16/2017 08:33   Dg Chest Portable 1 View  Result Date: 12/15/2017 CLINICAL DATA:  Short of breath EXAM: PORTABLE CHEST 1 VIEW COMPARISON:  07/14/2017 FINDINGS: Mild progression of diffuse bilateral airspace disease consistent with edema. Progression of small bilateral effusions. Postop CABG. IMPRESSION: Mild progression of pulmonary edema and small pleural effusions. Electronically Signed   By: Franchot Gallo M.D.   On: 12/15/2017 11:22       Assessment & Plan:   Problem List Items Addressed This Visit  Acute respiratory failure with hypoxia [facility])    Recently admitted.  Initially on BiPAP.  Treated with duonebs and diuresed.  Breathing better.  Weighing herself daily.  Continues on lasix.  F/u with cardiology and arrange evaluation with pulmonary.        Relevant Orders   Ambulatory referral to Pulmonology   Anemia     Recheck cbc.        Relevant Orders   CBC with Differential/Platelet (Completed)   Ferritin (Completed)   IBC panel (Completed)   Atherosclerosis of native arteries of extremity with intermittent claudication ([place])    S/p recent intervention.  Followed by [facility].  Stable.       CAD (coronary artery disease)    History of CABG.  Followed by cardiology.  Recently admitted and required diuresis.  Remains on lasix.  Breathing better.  F/u planned with cardiology.  Recheck metabolic panel.       Carotid stenosis    Followed by [facility] as outlined.  Note reviewed.  Continue dual antiplatelet therapy and her current medical regimen.  Declines statin medication.        CHF (congestive heart failure) ([place]) - Primary    Recently admitted with CHF.  Diuresed.  Weighing daily.  Breathing better.  Keep f/u with cardiology.        Relevant Orders   Comprehensive metabolic panel (Completed)   Hypercholesteremia    Had intolerance to cholesterol medication - statin medication.  Have discussed other treatment options. She declines.        Hypertension    Blood pressure as outlined.  Continue same medications.  Follow.            Einar Pheasant, MD

## 2017-12-20 NOTE — Telephone Encounter (Signed)
appt today 12/20/17.

## 2017-12-20 NOTE — Patient Instructions (Signed)
Robitussin twice a day as needed.

## 2017-12-21 ENCOUNTER — Encounter: Payer: Self-pay | Admitting: *Deleted

## 2017-12-23 ENCOUNTER — Ambulatory Visit: Payer: Medicare Other | Admitting: Family

## 2017-12-23 ENCOUNTER — Other Ambulatory Visit: Payer: Self-pay | Admitting: Internal Medicine

## 2017-12-23 NOTE — Telephone Encounter (Signed)
Left VM to CB; LRF was in 2015. Med is on current med profile; needs clarification.

## 2017-12-23 NOTE — Telephone Encounter (Signed)
Copied from [place] [id number]. Topic: Quick Communication - See Telephone Encounter >> Dec 23, 2017  7:58 AM Marja Kays F wrote: Pt is needing a refill on her lasix states that it might be too early but she is out of it   Best number [phone]  [facility]

## 2017-12-23 NOTE — Telephone Encounter (Signed)
Copied from [place] [id number]. Topic: Quick Communication - See Telephone Encounter >> Dec 23, 2017  7:58 AM Marja Kays F wrote: Pt is needing a refill on her lasix states that it might be too early but she is out of it   Best number [phone]  [facility]

## 2017-12-25 MED ORDER — FUROSEMIDE 40 MG PO TABS: 40.0000 mg | ORAL_TABLET | Freq: Every day | ORAL | 2 refills | Status: AC

## 2017-12-25 NOTE — Telephone Encounter (Signed)
rx ok'd for lasix 40mg  #30 with 2 refills.

## 2017-12-26 ENCOUNTER — Encounter: Payer: Self-pay | Admitting: Internal Medicine

## 2017-12-26 NOTE — Assessment & Plan Note (Signed)
S/p recent intervention.  Followed by [facility].  Stable.

## 2017-12-26 NOTE — Assessment & Plan Note (Signed)
Recently admitted with CHF.  Diuresed.  Weighing daily.  Breathing better.  Keep f/u with cardiology.

## 2017-12-26 NOTE — Assessment & Plan Note (Signed)
Followed by [facility] as outlined.  Note reviewed.  Continue dual antiplatelet therapy and her current medical regimen.  Declines statin medication.

## 2017-12-26 NOTE — Assessment & Plan Note (Signed)
Recently admitted.  Initially on BiPAP.  Treated with duonebs and diuresed.  Breathing better.  Weighing herself daily.  Continues on lasix.  F/u with cardiology and arrange evaluation with pulmonary.

## 2017-12-26 NOTE — Assessment & Plan Note (Signed)
Had intolerance to cholesterol medication - statin medication.  Have discussed other treatment options. She declines.

## 2017-12-26 NOTE — Assessment & Plan Note (Signed)
Recheck cbc.  

## 2017-12-26 NOTE — Assessment & Plan Note (Signed)
Blood pressure as outlined.  Continue same medications.  Follow.

## 2017-12-26 NOTE — Assessment & Plan Note (Signed)
History of CABG.  Followed by cardiology.  Recently admitted and required diuresis.  Remains on lasix.  Breathing better.  F/u planned with cardiology.  Recheck metabolic panel.

## 2017-12-29 ENCOUNTER — Other Ambulatory Visit (INDEPENDENT_AMBULATORY_CARE_PROVIDER_SITE_OTHER): Payer: Self-pay | Admitting: Vascular Surgery

## 2017-12-29 DIAGNOSIS — Z95820 Peripheral vascular angioplasty status with implants and grafts: Secondary | ICD-10-CM

## 2018-01-03 ENCOUNTER — Telehealth (INDEPENDENT_AMBULATORY_CARE_PROVIDER_SITE_OTHER): Payer: Self-pay | Admitting: Vascular Surgery

## 2018-01-03 NOTE — Telephone Encounter (Signed)
Patient should keep appointment as schedule because it is a follow up after procedure

## 2018-01-04 ENCOUNTER — Encounter (INDEPENDENT_AMBULATORY_CARE_PROVIDER_SITE_OTHER): Payer: Medicare Other

## 2018-01-04 ENCOUNTER — Telehealth: Payer: Self-pay | Admitting: *Deleted

## 2018-01-04 ENCOUNTER — Ambulatory Visit (INDEPENDENT_AMBULATORY_CARE_PROVIDER_SITE_OTHER): Payer: Medicare Other | Admitting: Vascular Surgery

## 2018-01-04 NOTE — Telephone Encounter (Signed)
Copied from [place] [id number]. Topic: Referral - Question >> Jan 04, 2018 10:46 AM Nils Flack wrote: Reason for CRM: pt called, and is checking on status of referral to Dr Raul Del.  She has not heard anything Please call [phone]

## 2018-01-06 NOTE — Telephone Encounter (Signed)
Her referral was just sent to Dr. Raul Del on 12/4 through [facility].

## 2018-01-11 ENCOUNTER — Other Ambulatory Visit (INDEPENDENT_AMBULATORY_CARE_PROVIDER_SITE_OTHER): Payer: Medicare Other

## 2018-01-11 ENCOUNTER — Encounter (INDEPENDENT_AMBULATORY_CARE_PROVIDER_SITE_OTHER): Payer: Self-pay | Admitting: Vascular Surgery

## 2018-01-11 ENCOUNTER — Ambulatory Visit (INDEPENDENT_AMBULATORY_CARE_PROVIDER_SITE_OTHER): Payer: Medicare Other | Admitting: Vascular Surgery

## 2018-01-11 VITALS — BP 108/59 | HR 71 | Ht 68.0 in | Wt 158.6 lb

## 2018-01-11 DIAGNOSIS — E7800 Pure hypercholesterolemia, unspecified: Secondary | ICD-10-CM

## 2018-01-11 DIAGNOSIS — I2510 Atherosclerotic heart disease of native coronary artery without angina pectoris: Secondary | ICD-10-CM | POA: Diagnosis not present

## 2018-01-11 DIAGNOSIS — I10 Essential (primary) hypertension: Secondary | ICD-10-CM | POA: Diagnosis not present

## 2018-01-11 DIAGNOSIS — Z95820 Peripheral vascular angioplasty status with implants and grafts: Secondary | ICD-10-CM | POA: Diagnosis not present

## 2018-01-11 DIAGNOSIS — I70213 Atherosclerosis of native arteries of extremities with intermittent claudication, bilateral legs: Secondary | ICD-10-CM

## 2018-01-11 DIAGNOSIS — I6523 Occlusion and stenosis of bilateral carotid arteries: Secondary | ICD-10-CM | POA: Diagnosis not present

## 2018-01-11 NOTE — Progress Notes (Signed)
  MRN : [id number]  Jennifer [name] is a 67 y.o. (12/22/1950) female who presents with chief complaint of  Chief Complaint  Patient presents with  . Follow-up  .  History of Present Illness: Patient returns today in follow up of PAD.  About 6 weeks ago, she underwent right lower extremity revascularization which was fairly extensive.  She had a fair bit of reperfusion pain and swelling.  She is also been limited by pulmonary issues and had to be hospitalized for pulmonary edema.  She has been referred to a pulmonologist as an outpatient as well.  Her claudication symptoms are now much better.  She is exercising a fair bit more.  Her noninvasive studies today demonstrate a marked improvement in her right ABI now measuring in the normal range at 0.97.  Her left ABI is slightly improved as well now measuring 0.74.  Current Outpatient Medications  Medication Sig Dispense Refill  . albuterol (PROVENTIL HFA;VENTOLIN HFA) 108 (90 Base) MCG/ACT inhaler Inhale 2 puffs into the lungs every 6 (six) hours as needed for wheezing or shortness of breath. 1 Inhaler 1  . ALPRAZolam (XANAX) 0.5 MG tablet Take 1/2 - 1 tablet q hs prn. (Patient taking differently: Take 0.25-0.5 mg by mouth at bedtime as needed. Take 1/2 - 1 tablet q hs prn.) 90 tablet 0  . Ascorbic Acid (VITAMIN C) 1000 MG tablet Take 1,000 mg by mouth 2 (two) times daily.     . aspirin EC 81 MG tablet Take 81 mg by mouth daily.    . carvedilol (COREG) 3.125 MG tablet Take 1 tablet (3.125 mg total) by mouth 2 (two) times daily with a meal. 90 tablet 1  . Cholecalciferol (CVS D3) 2000 units CAPS Take 2,000 Units by mouth 2 (two) times daily.    . clopidogrel (PLAVIX) 75 MG tablet Take 1 tablet (75 mg total) by mouth daily. 90 tablet 3  . Elderberry 575 MG/5ML SYRP Take by mouth daily.    . furosemide (LASIX) 40 MG tablet Take 1 tablet (40 mg total) by mouth daily. 30 tablet 2  . Garlic 1000 MG CAPS Take 1,000 mg by mouth daily.    . isosorbide  mononitrate (IMDUR) 60 MG 24 hr tablet Take 60-120 mg by mouth 2 (two) times daily. Take 120 mg by mouth in the morning and 60 mg by mouth in the evening.    . KRILL OIL PO Take 2,000 mg by mouth daily.     . losartan (COZAAR) 25 MG tablet Take 25 mg by mouth daily.     . Magnesium 250 MG TABS Take 500 mg by mouth at bedtime.     . Multiple Vitamin (MULTIVITAMIN WITH MINERALS) TABS tablet Take 1 tablet by mouth 2 (two) times daily.    . Multiple Vitamins-Minerals (AIRBORNE PO) Take by mouth.    . Multiple Vitamins-Minerals (ANTIOXIDANT FORMULA SG) capsule Take 1 capsule by mouth daily.    . nitroGLYCERIN (NITROSTAT) 0.4 MG SL tablet Place 1 tablet (0.4 mg total) under the tongue every 5 (five) minutes as needed for chest pain. 50 tablet 3  . OIL OF OREGANO PO Take 1,920 mg by mouth daily as needed (SINUS).     . Red Yeast Rice 600 MG CAPS Take 2 capsules by mouth 2 (two) times daily.    . Saccharomyces boulardii (PROBIOTIC) 250 MG CAPS Take 1 capsule by mouth daily.    . Sodium Chloride-Xylitol (XLEAR SINUS CARE SPRAY) SOLN Place 1 spray into   the nose daily.    . spironolactone (ALDACTONE) 25 MG tablet Take 1 tablet (25 mg total) by mouth daily. 30 tablet 6  . TURMERIC PO Take 2 tablets by mouth daily.     . vitamin E (VITAMIN E) 400 UNIT capsule Take 400 Units by mouth daily.     . zinc gluconate 50 MG tablet Take 50 mg by mouth daily.    . Coenzyme Q10 (COQ10) 100 MG CAPS Take 1 capsule by mouth 2 (two) times daily.     No current facility-administered medications for this visit.     Past Medical History:  Diagnosis Date  . CAD (coronary artery disease) 2003   s/p CABG x 5 (Dr Glower), MI- 2010  . Dyspnea   . Endometriosis   . Headache   . Hypercholesteremia   . Hypertension   . PVD (peripheral vascular disease) (HCC)    s/p intervention (left) - 2004, right (2006)  . Systolic CHF (HCC)    last EF 40%    Past Surgical History:  Procedure Laterality Date  . ABDOMINAL  HYSTERECTOMY    . BREAST SURGERY  2004   cyst removed  . cataract surgery  2007   left  . COLONOSCOPY WITH PROPOFOL N/A 10/30/2015   Procedure: COLONOSCOPY WITH PROPOFOL;  Surgeon: Jeffrey W Byrnett, MD;  Location: [facility] ENDOSCOPY;  Service: Endoscopy;  Laterality: N/A;  . CORONARY ARTERY BYPASS GRAFT     2003  . EYE SURGERY     Cataract  . LOWER EXTREMITY ANGIOGRAPHY Right 11/22/2017   Procedure: LOWER EXTREMITY ANGIOGRAPHY;  Surgeon: Dew, Jason S, MD;  Location: [facility] INVASIVE CV LAB;  Service: Cardiovascular;  Laterality: Right;  . VASCULAR SURGERY      Social History  Substance Use Topics  . Smoking status: Former Smoker    Quit date: 09/03/2008  . Smokeless tobacco: Never Used  . Alcohol use 0.0 oz/week      Comment: once a year     Family History      Family History  Problem Relation Age of Onset  . Heart disease Mother   . CVA Mother   . Heart disease Father    CABG  . Heart disease Unknown    aunts and uncles  . Breast cancer Neg Hx   . Colon cancer Neg Hx           Allergies  Allergen Reactions  . Cyclobenzaprine Other (See Comments)    Felt bad  . Prednisone Other (See Comments)    Felt bad     REVIEW OF SYSTEMS(Negative unless checked)  Constitutional: []Weight loss[]Fever[]Chills Cardiac:[]Chest pain[]Chest pressure[x]Palpitations []Shortness of breath when laying flat []Shortness of breath at rest [x]Shortness of breath with exertion. Vascular: [x]Pain in legs with walking[]Pain in legsat rest[]Pain in legs when laying flat [x]Claudication []Pain in feet when walking []Pain in feet at rest []Pain in feet when laying flat []History of DVT []Phlebitis []Swelling in legs []Varicose veins []Non-healing ulcers Pulmonary: []Uses home oxygen []Productive cough[]Hemoptysis []Wheeze []COPD []Asthma Neurologic: []Dizziness []Blackouts []Seizures []History of  stroke []History of TIA[]Aphasia []Temporary blindness[]Dysphagia []Weaknessor numbness in arms []Weakness or numbnessin legs Musculoskeletal: [x]Arthritis []Joint swelling []Joint pain []Low back pain Hematologic:[]Easy bruising[]Easy bleeding []Hypercoagulable state []Anemic  Gastrointestinal:[]Blood in stool[]Vomiting blood[]Gastroesophageal reflux/heartburn[]Abdominal pain Genitourinary: []Chronic kidney disease []Difficulturination []Frequenturination []Burning with urination[]Hematuria Skin: []Rashes []Ulcers []Wounds Psychological: []History of anxiety[]History of major depression.     Physical Examination  BP (!) 108/59 (BP Location: Right Arm, Patient Position: Sitting)     Pulse 71   Ht 5' 8" (1.727 m)   Wt 158 lb 9.6 oz (71.9 kg)   SpO2 (!) 14%   BMI 24.12 kg/m  Gen:  WD/WN, NAD Head: [place]/AT, No temporalis wasting. Ear/Nose/Throat: Hearing grossly intact, nares w/o erythema or drainage Eyes: Conjunctiva clear. Sclera non-icteric Neck: Supple.  Trachea midline Pulmonary:  Good air movement, no use of accessory muscles.  Cardiac: RRR, no JVD Vascular:  Vessel Right Left  Radial Palpable Palpable                          PT Palpable 1+ Palpable  DP Palpable 1+ Palpable    Musculoskeletal: M/S 5/5 throughout.  No deformity or atrophy. No significant edema. Neurologic: Sensation grossly intact in extremities.  Symmetrical.  Speech is fluent.  Psychiatric: Judgment intact, Mood & affect appropriate for pt's clinical situation. Dermatologic: No rashes or ulcers noted.  No cellulitis or open wounds.       Labs Recent Results (from the past 2160 hour(s))  BUN     Status: None   Collection Time: 11/19/17  7:59 AM  Result Value Ref Range   BUN 14 8 - 23 mg/dL    Comment: Performed at [facility], [address] [place], [place] [address]  Creatinine, serum     Status: None   Collection  Time: 11/19/17  7:59 AM  Result Value Ref Range   Creatinine, Ser 0.89 0.44 - 1.00 mg/dL   GFR calc non Af Amer >60 >60 mL/min   GFR calc Af Amer >60 >60 mL/min    Comment: (NOTE) The eGFR has been calculated using the CKD EPI equation. This calculation has not been validated in all clinical situations. eGFR's persistently <60 mL/min signify possible Chronic Kidney Disease. Performed at [facility], [address] [place], [place] [address]   Blood gas, venous     Status: Abnormal   Collection Time: 12/15/17 11:10 AM  Result Value Ref Range   pH, Ven 7.23 (L) 7.250 - 7.430   pCO2, Ven 39 (L) 44.0 - 60.0 mmHg   pO2, Ven 87.0 (H) 32.0 - 45.0 mmHg   Bicarbonate 16.3 (L) 20.0 - 28.0 mmol/L   Acid-base deficit 10.6 (H) 0.0 - 2.0 mmol/L   O2 Saturation 94.6 %   Patient temperature 37.0    Collection site VEIN    Sample type VEIN     Comment: Performed at [facility], [address] , [place] [address]  CBC with Differential/Platelet     Status: Abnormal   Collection Time: 12/15/17 11:14 AM  Result Value Ref Range   WBC 9.8 4.0 - 10.5 K/uL   RBC 3.88 3.87 - 5.11 MIL/uL   Hemoglobin 11.7 (L) 12.0 - 15.0 g/dL   HCT 37.2 36.0 - 46.0 %   MCV 95.9 80.0 - 100.0 fL   MCH 30.2 26.0 - 34.0 pg   MCHC 31.5 30.0 - 36.0 g/dL   RDW 14.5 11.5 - 15.5 %   Platelets 479 (H) 150 - 400 K/uL   nRBC 0.0 0.0 - 0.2 %   Neutrophils Relative % 52 %   Neutro Abs 5.2 1.7 - 7.7 K/uL   Lymphocytes Relative 39 %   Lymphs Abs 3.8 0.7 - 4.0 K/uL   Monocytes Relative 5 %   Monocytes Absolute 0.4 0.1 - 1.0 K/uL   Eosinophils Relative 2 %   Eosinophils Absolute 0.2 0.0 - 0.5 K/uL     Basophils Relative 1 %   Basophils Absolute 0.1 0.0 - 0.1 K/uL   Immature Granulocytes 1 %   Abs Immature Granulocytes 0.09 (H) 0.00 - 0.07 K/uL    Comment: Performed at [facility], [address] [place], [place] [address]  Basic metabolic panel     Status: Abnormal   Collection Time:  12/15/17 11:14 AM  Result Value Ref Range   Sodium 132 (L) 135 - 145 mmol/L   Potassium 4.3 3.5 - 5.1 mmol/L   Chloride 96 (L) 98 - 111 mmol/L   CO2 18 (L) 22 - 32 mmol/L   Glucose, Bld 360 (H) 70 - 99 mg/dL   BUN 13 8 - 23 mg/dL   Creatinine, Ser 1.13 (H) 0.44 - 1.00 mg/dL   Calcium 8.9 8.9 - 10.3 mg/dL   GFR calc non Af Amer 49 (L) >60 mL/min   GFR calc Af Amer 57 (L) >60 mL/min    Comment: (NOTE) The eGFR has been calculated using the CKD EPI equation. This calculation has not been validated in all clinical situations. eGFR's persistently <60 mL/min signify possible Chronic Kidney Disease.    Anion gap 18 (H) 5 - 15    Comment: Performed at [facility], [address] [place], [place] [address]  Troponin I - ONCE - STAT     Status: None   Collection Time: 12/15/17 11:14 AM  Result Value Ref Range   Troponin I <0.03 <0.03 ng/mL    Comment: Performed at [facility], [address] [place], [place] [address]  Brain natriuretic peptide     Status: Abnormal   Collection Time: 12/15/17 11:14 AM  Result Value Ref Range   B Natriuretic Peptide 1,536.0 (H) 0.0 - 100.0 pg/mL    Comment: Performed at [facility], [address] [place], [place] [address]  Magnesium     Status: Abnormal   Collection Time: 12/15/17 11:14 AM  Result Value Ref Range   Magnesium 2.6 (H) 1.7 - 2.4 mg/dL    Comment: Performed at [facility], [address] [place], [place] [address]  Procalcitonin - Baseline     Status: None   Collection Time: 12/15/17 11:14 AM  Result Value Ref Range   Procalcitonin <0.10 ng/mL    Comment:        Interpretation: PCT (Procalcitonin) <= 0.5 ng/mL: Systemic infection (sepsis) is not likely. Local bacterial infection is possible. (NOTE)       Sepsis PCT Algorithm           Lower Respiratory Tract                                      Infection PCT Algorithm    ----------------------------     ----------------------------          PCT < 0.25 ng/mL                PCT < 0.10 ng/mL         Strongly encourage             Strongly discourage   discontinuation of antibiotics    initiation of antibiotics    ----------------------------     -----------------------------       PCT 0.25 - 0.50 ng/mL            PCT 0.10 - 0.25 ng/mL                 OR       >80% decrease in PCT            Discourage initiation of                                            antibiotics      Encourage discontinuation           of antibiotics    ----------------------------     -----------------------------         PCT >= 0.50 ng/mL              PCT 0.26 - 0.50 ng/mL               AND        <80% decrease in PCT             Encourage initiation of                                             antibiotics       Encourage continuation           of antibiotics    ----------------------------     -----------------------------        PCT >= 0.50 ng/mL                  PCT > 0.50 ng/mL               AND         increase in PCT                  Strongly encourage                                      initiation of antibiotics    Strongly encourage escalation           of antibiotics                                     -----------------------------                                           PCT <= 0.25 ng/mL                                                 OR                                        > 80% decrease in PCT                                     Discontinue / Do not initiate                                               antibiotics Performed at [facility], [address] [place], [place] [address]   CG4 I-STAT (Lactic acid)     Status: Abnormal   Collection Time: 12/15/17 11:25 AM  Result Value Ref Range   Lactic Acid, Venous 7.49 (HH) 0.5 - 1.9 mmol/L   Comment NOTIFIED PHYSICIAN   I-STAT, chem 8     Status: Abnormal   Collection Time: 12/15/17 11:25 AM  Result Value Ref Range   Sodium 131 (L) 135 - 145 mmol/L    Potassium 4.3 3.5 - 5.1 mmol/L   Chloride 99 98 - 111 mmol/L   BUN 11 8 - 23 mg/dL   Creatinine, Ser 1.10 (H) 0.44 - 1.00 mg/dL   Glucose, Bld 367 (H) 70 - 99 mg/dL   Calcium, Ion 1.14 (L) 1.15 - 1.40 mmol/L   TCO2 20 (L) 22 - 32 mmol/L   Hemoglobin 12.9 12.0 - 15.0 g/dL   HCT 38.0 36.0 - 46.0 %  Glucose, capillary     Status: Abnormal   Collection Time: 12/15/17  2:52 PM  Result Value Ref Range   Glucose-Capillary 139 (H) 70 - 99 mg/dL  MRSA PCR Screening     Status: None   Collection Time: 12/15/17  3:06 PM  Result Value Ref Range   MRSA by PCR NEGATIVE NEGATIVE    Comment:        The GeneXpert MRSA Assay (FDA approved for NASAL specimens only), is one component of a comprehensive MRSA colonization surveillance program. It is not intended to diagnose MRSA infection nor to guide or monitor treatment for MRSA infections. Performed at [facility], [address] [place], [place] [address]   Lactic acid, plasma     Status: None   Collection Time: 12/15/17  3:13 PM  Result Value Ref Range   Lactic Acid, Venous 1.5 0.5 - 1.9 mmol/L    Comment: Performed at [facility], [address] [place], [place] [address]  Hemoglobin A1c     Status: None   Collection Time: 12/15/17  3:13 PM  Result Value Ref Range   Hgb A1c MFr Bld 5.3 4.8 - 5.6 %    Comment: (NOTE) Pre diabetes:          5.7%-6.4% Diabetes:              >6.4% Glycemic control for   <7.0% adults with diabetes    Mean Plasma Glucose 105.41 mg/dL    Comment: Performed at [facility], [address] [place], [place] [address]  Glucose, capillary     Status: Abnormal   Collection Time: 12/15/17  4:22 PM  Result Value Ref Range   Glucose-Capillary 134 (H) 70 - 99 mg/dL  Blood gas, arterial     Status: Abnormal   Collection Time: 12/15/17  5:05 PM  Result Value Ref Range   FIO2 28.00    Delivery systems NASAL CANNULA    pH, Arterial 7.52 (H) 7.350 - 7.450   pCO2 arterial 33 32.0 -  48.0 mmHg   pO2, Arterial 81 (L) 83.0 - 108.0 mmHg   Bicarbonate 26.9 20.0 - 28.0 mmol/L   Acid-Base Excess 4.2 (H) 0.0 - 2.0 mmol/L   O2 Saturation 97.0 %   Patient temperature 37.0    Collection site RIGHT RADIAL    Sample type ARTERIAL DRAW    Allens test (pass/fail) PASS PASS    Comment: Performed at [facility], [address] [place], [place] [address]  Lactic acid,   plasma     Status: Abnormal   Collection Time: 12/15/17  5:46 PM  Result Value Ref Range   Lactic Acid, Venous 2.6 (HH) 0.5 - 1.9 mmol/L    Comment: CRITICAL RESULT CALLED TO, READ BACK BY AND VERIFIED WITH ANGELA BREHUN 12/15/17 @ [place] Performed at [facility], [address] [place], [place] [address]   Troponin I - Now Then Q6H     Status: Abnormal   Collection Time: 12/15/17  5:46 PM  Result Value Ref Range   Troponin I 0.12 (HH) <0.03 ng/mL    Comment: CRITICAL RESULT CALLED TO, READ BACK BY AND VERIFIED WITH ANGELA BREHUN 12/15/17 @ 1819  MLK Performed at [facility], [address] [place], [place] [address]   Glucose, capillary     Status: Abnormal   Collection Time: 12/15/17  9:42 PM  Result Value Ref Range   Glucose-Capillary 165 (H) 70 - 99 mg/dL  Troponin I - Now Then Q6H     Status: Abnormal   Collection Time: 12/15/17 11:22 PM  Result Value Ref Range   Troponin I 0.11 (HH) <0.03 ng/mL    Comment: CRITICAL VALUE NOTED. VALUE IS CONSISTENT WITH PREVIOUSLY REPORTED/CALLED VALUE / [place] Performed at [facility], [address] [place], [place] [address]   CBC     Status: Abnormal   Collection Time: 12/16/17  5:42 AM  Result Value Ref Range   WBC 10.6 (H) 4.0 - 10.5 K/uL   RBC 3.72 (L) 3.87 - 5.11 MIL/uL   Hemoglobin 11.2 (L) 12.0 - 15.0 g/dL   HCT 34.1 (L) 36.0 - 46.0 %   MCV 91.7 80.0 - 100.0 fL   MCH 30.1 26.0 - 34.0 pg   MCHC 32.8 30.0 - 36.0 g/dL   RDW 14.5 11.5 - 15.5 %   Platelets 345 150 - 400 K/uL   nRBC 0.0 0.0 - 0.2 %    Comment:  Performed at [facility], [address] [place], [place] [address]  Troponin I - Now Then Q6H     Status: Abnormal   Collection Time: 12/16/17  5:42 AM  Result Value Ref Range   Troponin I 0.10 (HH) <0.03 ng/mL    Comment: CRITICAL VALUE NOTED. VALUE IS CONSISTENT WITH PREVIOUSLY REPORTED/CALLED VALUE TTG/JJB Performed at [facility], [address] [place], [place] [address]   Brain natriuretic peptide     Status: Abnormal   Collection Time: 12/16/17  5:42 AM  Result Value Ref Range   B Natriuretic Peptide 1,381.0 (H) 0.0 - 100.0 pg/mL    Comment: Performed at [facility], [address] [place], [place] [address]  Comprehensive metabolic panel     Status: Abnormal   Collection Time: 12/16/17  5:42 AM  Result Value Ref Range   Sodium 136 135 - 145 mmol/L   Potassium 3.6 3.5 - 5.1 mmol/L   Chloride 95 (L) 98 - 111 mmol/L   CO2 25 22 - 32 mmol/L   Glucose, Bld 124 (H) 70 - 99 mg/dL   BUN 17 8 - 23 mg/dL   Creatinine, Ser 0.88 0.44 - 1.00 mg/dL   Calcium 9.7 8.9 - 10.3 mg/dL   Total Protein 7.8 6.5 - 8.1 g/dL   Albumin 4.5 3.5 - 5.0 g/dL   AST 17 15 - 41 U/L   ALT 14 0 - 44 U/L   Alkaline Phosphatase 68 38 - 126 U/L   Total Bilirubin 1.0 0.3 -  1.2 mg/dL   GFR calc non Af Amer >60 >60 mL/min   GFR calc Af Amer >60 >60 mL/min    Comment: (NOTE) The eGFR has been calculated using the CKD EPI equation. This calculation has not been validated in all clinical situations. eGFR's persistently <60 mL/min signify possible Chronic Kidney Disease.    Anion gap 16 (H) 5 - 15    Comment: Performed at [facility], [address] [place], [place] [address]  Magnesium     Status: None   Collection Time: 12/16/17  5:42 AM  Result Value Ref Range   Magnesium 2.1 1.7 - 2.4 mg/dL    Comment: Performed at [facility], [address] [place], [place] [address]  Glucose, capillary     Status: Abnormal   Collection Time: 12/16/17  7:46  AM  Result Value Ref Range   Glucose-Capillary 101 (H) 70 - 99 mg/dL  Glucose, capillary     Status: Abnormal   Collection Time: 12/16/17 11:08 AM  Result Value Ref Range   Glucose-Capillary 140 (H) 70 - 99 mg/dL  Glucose, capillary     Status: Abnormal   Collection Time: 12/16/17  3:47 PM  Result Value Ref Range   Glucose-Capillary 127 (H) 70 - 99 mg/dL  Glucose, capillary     Status: Abnormal   Collection Time: 12/16/17  9:34 PM  Result Value Ref Range   Glucose-Capillary 111 (H) 70 - 99 mg/dL  Basic metabolic panel     Status: Abnormal   Collection Time: 12/17/17  3:25 AM  Result Value Ref Range   Sodium 139 135 - 145 mmol/L   Potassium 4.2 3.5 - 5.1 mmol/L   Chloride 101 98 - 111 mmol/L   CO2 27 22 - 32 mmol/L   Glucose, Bld 101 (H) 70 - 99 mg/dL   BUN 23 8 - 23 mg/dL   Creatinine, Ser 0.89 0.44 - 1.00 mg/dL   Calcium 9.3 8.9 - 10.3 mg/dL   GFR calc non Af Amer >60 >60 mL/min   GFR calc Af Amer >60 >60 mL/min    Comment: (NOTE) The eGFR has been calculated using the CKD EPI equation. This calculation has not been validated in all clinical situations. eGFR's persistently <60 mL/min signify possible Chronic Kidney Disease.    Anion gap 11 5 - 15    Comment: Performed at [facility], [address] [place],  [address]  CBC with Differential/Platelet     Status: Abnormal   Collection Time: 12/17/17  3:25 AM  Result Value Ref Range   WBC 8.3 4.0 - 10.5 K/uL   RBC 3.41 (L) 3.87 - 5.11 MIL/uL   Hemoglobin 10.3 (L) 12.0 - 15.0 g/dL   HCT 31.7 (L) 36.0 - 46.0 %   MCV 93.0 80.0 - 100.0 fL   MCH 30.2 26.0 - 34.0 pg   MCHC 32.5 30.0 - 36.0 g/dL   RDW 14.8 11.5 - 15.5 %   Platelets 292 150 - 400 K/uL   nRBC 0.0 0.0 - 0.2 %   Neutrophils Relative % 67 %   Neutro Abs 5.5 1.7 - 7.7 K/uL   Lymphocytes Relative 26 %   Lymphs Abs 2.2 0.7 - 4.0 K/uL   Monocytes Relative 6 %   Monocytes Absolute 0.5 0.1 - 1.0 K/uL   Eosinophils Relative 1 %   Eosinophils  Absolute 0.0 0.0 - 0.5 K/uL   Basophils Relative 0 %   Basophils Absolute 0.0 0.0 - 0.1   K/uL   Immature Granulocytes 0 %   Abs Immature Granulocytes 0.03 0.00 - 0.07 K/uL    Comment: Performed at [facility], [address] [place], [place] [address]  Magnesium     Status: Abnormal   Collection Time: 12/17/17  3:25 AM  Result Value Ref Range   Magnesium 2.5 (H) 1.7 - 2.4 mg/dL    Comment: Performed at [facility], [address] [place], [place] [address]  Glucose, capillary     Status: None   Collection Time: 12/17/17  8:05 AM  Result Value Ref Range   Glucose-Capillary 87 70 - 99 mg/dL  Glucose, capillary     Status: Abnormal   Collection Time: 12/17/17 12:02 PM  Result Value Ref Range   Glucose-Capillary 118 (H) 70 - 99 mg/dL  Comprehensive metabolic panel     Status: None   Collection Time: 12/20/17  1:10 PM  Result Value Ref Range   Sodium 136 135 - 145 mEq/L   Potassium 4.1 3.5 - 5.1 mEq/L   Chloride 98 96 - 112 mEq/L   CO2 28 19 - 32 mEq/L   Glucose, Bld 97 70 - 99 mg/dL   BUN 12 6 - 23 mg/dL   Creatinine, Ser 0.93 0.40 - 1.20 mg/dL   Total Bilirubin 0.4 0.2 - 1.2 mg/dL   Alkaline Phosphatase 65 39 - 117 U/L   AST 14 0 - 37 U/L   ALT 17 0 - 35 U/L   Total Protein 7.3 6.0 - 8.3 g/dL   Albumin 4.5 3.5 - 5.2 g/dL   Calcium 9.7 8.4 - 10.5 mg/dL   GFR 63.85 >60.00 mL/min  CBC with Differential/Platelet     Status: Abnormal   Collection Time: 12/20/17  1:10 PM  Result Value Ref Range   WBC 6.8 4.0 - 10.5 K/uL   RBC 3.76 (L) 3.87 - 5.11 Mil/uL   Hemoglobin 11.6 (L) 12.0 - 15.0 g/dL   HCT 34.2 (L) 36.0 - 46.0 %   MCV 91.0 78.0 - 100.0 fl   MCHC 34.0 30.0 - 36.0 g/dL   RDW 15.1 11.5 - 15.5 %   Platelets 315.0 150.0 - 400.0 K/uL   Neutrophils Relative % 62.6 43.0 - 77.0 %   Lymphocytes Relative 25.1 12.0 - 46.0 %   Monocytes Relative 9.2 3.0 - 12.0 %   Eosinophils Relative 2.2 0.0 - 5.0 %   Basophils Relative 0.9 0.0 - 3.0 %   Neutro Abs 4.3  1.4 - 7.7 K/uL   Lymphs Abs 1.7 0.7 - 4.0 K/uL   Monocytes Absolute 0.6 0.1 - 1.0 K/uL   Eosinophils Absolute 0.2 0.0 - 0.7 K/uL   Basophils Absolute 0.1 0.0 - 0.1 K/uL  Ferritin     Status: None   Collection Time: 12/20/17  1:10 PM  Result Value Ref Range   Ferritin 136.6 10.0 - 291.0 ng/mL  IBC panel     Status: Abnormal   Collection Time: 12/20/17  1:10 PM  Result Value Ref Range   Iron 55 42 - 145 ug/dL   Transferrin 270.0 212.0 - 360.0 mg/dL   Saturation Ratios 14.6 (L) 20.0 - 50.0 %    Radiology Dg Chest Port 1 View  Result Date: 12/16/2017 CLINICAL DATA:  Shortness of breath EXAM: PORTABLE CHEST 1 VIEW COMPARISON:  12/15/2017 FINDINGS: Prior CABG. Cardiomegaly. Interstitial edema, improved since prior study. No confluent opacities. Suspect small effusions. IMPRESSION: Mild interstitial edema, improved since prior study. Suspect small effusions. Electronically Signed  By: Rolm Baptise M.D.   On: 12/16/2017 08:33   Dg Chest Portable 1 View  Result Date: 12/15/2017 CLINICAL DATA:  Short of breath EXAM: PORTABLE CHEST 1 VIEW COMPARISON:  07/14/2017 FINDINGS: Mild progression of diffuse bilateral airspace disease consistent with edema. Progression of small bilateral effusions. Postop CABG. IMPRESSION: Mild progression of pulmonary edema and small pleural effusions. Electronically Signed   By: Franchot Gallo M.D.   On: 12/15/2017 11:22    Assessment/Plan Hypertension blood pressure control important in reducing the progression of atherosclerotic disease. On appropriate oral medications.   Hypercholesteremia lipid control important in reducing the progression of atherosclerotic disease. Continue statin therapy   CAD (coronary artery disease) Stable, s/p CABG. 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  Her left ABI is slightly improved as well now measuring 0.74. She is doing much better.  She reports her claudication symptoms to be markedly improved actually in both legs.  She does not want to do anything with her left leg currently as her leg is feeling well and she has a lot of other ongoing problems including cardiopulmonary issues.  That is certainly reasonable.  At this point, I will just plan a six-month follow-up with noninvasive studies.  She will continue her current medical regimen.  She will contact our office with any problems in the interim.    Leotis Pain, MD  01/11/2018 2:58 PM    This note was created with Dragon medical transcription system.  Any errors from dictation are purely unintentional

## 2018-01-11 NOTE — Patient Instructions (Signed)

## 2018-01-11 NOTE — Assessment & Plan Note (Signed)
Her noninvasive studies today demonstrate a marked improvement in her right ABI now measuring in the normal range at 0.97.  Her left ABI is slightly improved as well now measuring 0.74. She is doing much better.  She reports her claudication symptoms to be markedly improved actually in both legs.  She does not want to do anything with her left leg currently as her leg is feeling well and she has a lot of other ongoing problems including cardiopulmonary issues.  That is certainly reasonable.  At this point, I will just plan a six-month follow-up with noninvasive studies.  She will continue her current medical regimen.  She will contact our office with any problems in the interim.

## 2018-01-14 DIAGNOSIS — J449 Chronic obstructive pulmonary disease, unspecified: Secondary | ICD-10-CM | POA: Diagnosis not present

## 2018-01-14 DIAGNOSIS — R0609 Other forms of dyspnea: Secondary | ICD-10-CM | POA: Diagnosis not present

## 2018-01-19 ENCOUNTER — Other Ambulatory Visit: Payer: Self-pay | Admitting: Internal Medicine

## 2018-02-08 ENCOUNTER — Ambulatory Visit (INDEPENDENT_AMBULATORY_CARE_PROVIDER_SITE_OTHER): Payer: Medicare Other | Admitting: Internal Medicine

## 2018-02-08 VITALS — BP 124/70 | HR 77 | Temp 98.1°F | Resp 16 | Wt 158.6 lb

## 2018-02-08 DIAGNOSIS — E7800 Pure hypercholesterolemia, unspecified: Secondary | ICD-10-CM | POA: Diagnosis not present

## 2018-02-08 DIAGNOSIS — I5022 Chronic systolic (congestive) heart failure: Secondary | ICD-10-CM

## 2018-02-08 DIAGNOSIS — I70213 Atherosclerosis of native arteries of extremities with intermittent claudication, bilateral legs: Secondary | ICD-10-CM

## 2018-02-08 DIAGNOSIS — D649 Anemia, unspecified: Secondary | ICD-10-CM

## 2018-02-08 DIAGNOSIS — J9601 Acute respiratory failure with hypoxia: Secondary | ICD-10-CM | POA: Diagnosis not present

## 2018-02-08 DIAGNOSIS — I2510 Atherosclerotic heart disease of native coronary artery without angina pectoris: Secondary | ICD-10-CM | POA: Diagnosis not present

## 2018-02-08 DIAGNOSIS — I10 Essential (primary) hypertension: Secondary | ICD-10-CM

## 2018-02-08 DIAGNOSIS — I739 Peripheral vascular disease, unspecified: Secondary | ICD-10-CM | POA: Diagnosis not present

## 2018-02-08 DIAGNOSIS — I6523 Occlusion and stenosis of bilateral carotid arteries: Secondary | ICD-10-CM

## 2018-02-08 LAB — CBC WITH DIFFERENTIAL/PLATELET
Basophils Absolute: 0.1 10*3/uL (ref 0.0–0.1)
Basophils Relative: 1 % (ref 0.0–3.0)
Eosinophils Absolute: 0.2 10*3/uL (ref 0.0–0.7)
Eosinophils Relative: 2.6 % (ref 0.0–5.0)
HCT: 39.6 % (ref 36.0–46.0)
HEMOGLOBIN: 13.2 g/dL (ref 12.0–15.0)
Lymphocytes Relative: 23.9 % (ref 12.0–46.0)
Lymphs Abs: 1.5 10*3/uL (ref 0.7–4.0)
MCHC: 33.3 g/dL (ref 30.0–36.0)
MCV: 89.5 fl (ref 78.0–100.0)
Monocytes Absolute: 0.5 10*3/uL (ref 0.1–1.0)
Monocytes Relative: 7.8 % (ref 3.0–12.0)
Neutro Abs: 4 10*3/uL (ref 1.4–7.7)
Neutrophils Relative %: 64.7 % (ref 43.0–77.0)
Platelets: 280 10*3/uL (ref 150.0–400.0)
RBC: 4.42 Mil/uL (ref 3.87–5.11)
RDW: 15.5 % (ref 11.5–15.5)
WBC: 6.2 10*3/uL (ref 4.0–10.5)

## 2018-02-08 LAB — BASIC METABOLIC PANEL
BUN: 8 mg/dL (ref 6–23)
CO2: 29 mEq/L (ref 19–32)
Calcium: 9.9 mg/dL (ref 8.4–10.5)
Chloride: 103 mEq/L (ref 96–112)
Creatinine, Ser: 0.94 mg/dL (ref 0.40–1.20)
GFR: 63.05 mL/min (ref >60.00)
GLUCOSE: 83 mg/dL (ref 70–99)
Potassium: 4.3 mEq/L (ref 3.5–5.1)
Sodium: 141 mEq/L (ref 135–145)

## 2018-02-08 LAB — HEPATIC FUNCTION PANEL
ALT: 10 U/L (ref 0–35)
AST: 13 U/L (ref 0–37)
Albumin: 4.7 g/dL (ref 3.5–5.2)
Alkaline Phosphatase: 64 U/L (ref 39–117)
BILIRUBIN TOTAL: 0.4 mg/dL (ref 0.2–1.2)
Bilirubin, Direct: 0 mg/dL (ref 0.0–0.3)
Total Protein: 7 g/dL (ref 6.0–8.3)

## 2018-02-08 LAB — FERRITIN: Ferritin: 63.6 ng/mL (ref 10.0–291.0)

## 2018-02-08 MED ORDER — ALPRAZOLAM 0.5 MG PO TABS: ORAL_TABLET | ORAL | 0 refills | Status: DC

## 2018-02-08 MED ORDER — ALBUTEROL SULFATE HFA 108 (90 BASE) MCG/ACT IN AERS: INHALATION_SPRAY | RESPIRATORY_TRACT | 1 refills | Status: AC

## 2018-02-08 NOTE — Progress Notes (Signed)
Jennifer [name] ID: Jennifer Jennifer [name], female   DOB: [birth year], 68 y.o.   MRN: [id number]   Subjective:    Jennifer [name] ID: Jennifer Jennifer [name], female    DOB: Sep 18, 1950, 68 y.o.   MRN: [id number]  HPI  Jennifer [name] here for a scheduled follow up.  Is s/p f/u PAD - s/p lower extremity revascularization.  Just saw Dr Lucky Cowboy 01/11/18.  Stable.  Recommended f/u in 6 months.  Saw Dr Raul Del 01/14/18.  Using anora.  Helped.  Plans to f/u with pulmonary.  States had spasm in her chest after coughing - 01/27/18.  Has not had episode since.  Feels her breathing is stable.  Has f/u planned with Dr Megan Salon (her cardiologist) next Monday.  No acid reflux.  No abdominal pain.  Weight herself regularly.  Weight at home stable between 151-153.  Bowels stable.  Leg doing well.     Past Medical History:  Diagnosis Date  . CAD (coronary artery disease) 2003   s/p CABG x 5 (Dr Evelina Dun), [place]- 2010  . Dyspnea   . Endometriosis   . Headache   . Hypercholesteremia   . Hypertension   . PVD (peripheral vascular disease) ([place])    s/p intervention (left) - 2004, right (2006)  . Systolic CHF ([place])    last EF 40%   Past Surgical History:  Procedure Laterality Date  . ABDOMINAL HYSTERECTOMY    . BREAST SURGERY  2004   cyst removed  . cataract surgery  2007   left  . COLONOSCOPY WITH PROPOFOL N/A 10/30/2015   Procedure: COLONOSCOPY WITH PROPOFOL;  Surgeon: Robert Bellow, MD;  Location: [facility] END[facility]COPY;  Service: Endoscopy;  Laterality: N/A;  . CORONARY ARTERY BYPASS GRAFT     2003  . EYE SURGERY     Cataract  . LOWER EXTREMITY ANGIOGRAPHY Right 11/22/2017   Procedure: LOWER EXTREMITY ANGIOGRAPHY;  Surgeon: Algernon Huxley, MD;  Location: [facility];  Service: Cardiovascular;  Laterality: Right;  Marland Kitchen VASCULAR SURGERY     Family History  Problem Relation Age of Onset  . Heart disease Mother   . CVA Mother   . Heart disease Father        CABG  . Heart disease Other        aunts and uncles  . Diabetes Sister   . Breast  cancer Neg Hx   . Colon cancer Neg Hx    Social History   Socioeconomic History  . Marital status: Married    Spouse name: Not on file  . Number of children: Not on file  . Years of education: Not on file  . Highest education level: Not on file  Occupational History  . Not on file  Social Needs  . Financial resource strain: Not hard at all  . Food insecurity:    Worry: Never true    Inability: Never true  . Transportation needs:    Medical: No    Non-medical: No  Tobacco Use  . Smoking status: Former Smoker    Last attempt to quit: 09/03/2008    Years since quitting: 9.4  . Smokeless tobacco: Never Used  Substance and Sexual Activity  . Alcohol use: Yes    Alcohol/week: 0.0 standard drinks    Comment: once a year  . Drug use: No  . Sexual activity: Not Currently  Lifestyle  . Physical activity:    Days per week: 3 days    Minutes per session: 60 min  . Stress: Not at  all  Relationships  . Social connections:    Talks on phone: Not on file    Gets together: Not on file    Attends religious service: Not on file    Active member of club or organization: Not on file    Attends meetings of clubs or organizations: Not on file    Relationship status: Married  Other Topics Concern  . Not on file  Social History Narrative   Lives at home with husband, independent at baseline    Outpatient Encounter Medications as of 02/08/2018  Medication Sig  . albuterol (VENTOLIN HFA) 108 (90 Base) MCG/ACT inhaler INHALE 2 PUFFS INTO THE LUNGS EVERY 6 HOURS AS NEEDED FOR WHEEZING ORSHORTNESS OF BREATH  . ALPRAZolam (XANAX) 0.5 MG tablet Take 1/2 - 1 tablet q hs prn.  . Ascorbic Acid (VITAMIN C) 1000 MG tablet Take 1,000 mg by mouth 2 (two) times daily.   Marland Kitchen aspirin EC 81 MG tablet Take 81 mg by mouth daily.  . carvedilol (COREG) 3.125 MG tablet Take 1 tablet (3.125 mg total) by mouth 2 (two) times daily with a meal.  . Cholecalciferol ([employer] D3) 2000 units CAPS Take 2,000 Units by mouth 2  (two) times daily.  . clopidogrel (PLAVIX) 75 MG tablet Take 1 tablet (75 mg total) by mouth daily.  . Coenzyme Q10 (COQ10) 100 MG CAPS Take 1 capsule by mouth 2 (two) times daily.  Kendall Flack 575 MG/5ML SYRP Take by mouth daily.  . furosemide (LASIX) 40 MG tablet Take 1 tablet (40 mg total) by mouth daily.  . Garlic 3810 MG CAPS Take 1,000 mg by mouth daily.  . isosorbide mononitrate (IMDUR) 60 MG 24 hr tablet Take 60-120 mg by mouth 2 (two) times daily. Take 120 mg by mouth in the morning and 60 mg by mouth in the evening.  Marland Kitchen KRILL OIL PO Take 2,000 mg by mouth daily.   Marland Kitchen losartan (COZAAR) 25 MG tablet Take 25 mg by mouth daily.   . Magnesium 250 MG TABS Take 500 mg by mouth at bedtime.   . Multiple Vitamin (MULTIVITAMIN WITH MINERALS) TABS tablet Take 1 tablet by mouth 2 (two) times daily.  . Multiple Vitamins-Minerals (AIRBORNE PO) Take by mouth.  . Multiple Vitamins-Minerals (ANTIOXIDANT FORMULA SG) capsule Take 1 capsule by mouth daily.  . nitroGLYCERIN (NITROSTAT) 0.4 MG SL tablet Place 1 tablet (0.4 mg total) under the tongue every 5 (five) minutes as needed for chest pain.  . OIL OF OREGANO PO Take 1,920 mg by mouth daily as needed (SINUS).   . Red Yeast Rice 600 MG CAPS Take 2 capsules by mouth 2 (two) times daily.  . Saccharomyces boulardii (PROBIOTIC) 250 MG CAPS Take 1 capsule by mouth daily.  . Sodium Chloride-Xylitol (XLEAR SINUS CARE SPRAY) SOLN Place 1 spray into the nose daily.  Marland Kitchen spironolactone (ALDACTONE) 25 MG tablet Take 1 tablet (25 mg total) by mouth daily.  . TURMERIC PO Take 2 tablets by mouth daily.   . vitamin E (VITAMIN E) 400 UNIT capsule Take 400 Units by mouth daily.   Marland Kitchen zinc gluconate 50 MG tablet Take 50 mg by mouth daily.  . [DISCONTINUED] ALPRAZolam (XANAX) 0.5 MG tablet Take 1/2 - 1 tablet q hs prn. (Jennifer [name] taking differently: Take 0.25-0.5 mg by mouth at bedtime as needed. Take 1/2 - 1 tablet q hs prn.)  . [DISCONTINUED] VENTOLIN HFA 108 (90 Base)  MCG/ACT inhaler INHALE 2 PUFFS INTO THE LUNGS EVERY 6 HOURS AS  NEEDED FOR WHEEZING ORSHORTNESS OF BREATH   No facility-administered encounter medications on file as of 02/08/2018.     Review of Systems  Constitutional: Negative for appetite change and unexpected weight change.  HENT: Negative for congestion and sinus pressure.   Respiratory: Negative for cough.        Breathing stable.    Cardiovascular: Negative for palpitations and leg swelling.       Chest spasms as outlined.  Occurs with coughin.    Gastrointestinal: Negative for abdominal pain, diarrhea, nausea and vomiting.  Genitourinary: Negative for difficulty urinating and dysuria.  Musculoskeletal: Negative for joint swelling and myalgias.  Skin: Negative for color change and rash.  Neurological: Negative for dizziness, light-headedness and headaches.  Psychiatric/Behavioral: Negative for agitation and dysphoric mood.       Objective:    Physical Exam Constitutional:      General: She is not in acute distress.    Appearance: Normal appearance.  HENT:     Nose: Nose normal. No congestion.     Mouth/Throat:     Pharynx: No oropharyngeal exudate or posterior oropharyngeal erythema.  Neck:     Musculoskeletal: Neck supple. No muscular tenderness.     Thyroid: No thyromegaly.  Cardiovascular:     Rate and Rhythm: Normal rate and regular rhythm.  Pulmonary:     Effort: No respiratory distress.     Breath sounds: Normal breath sounds. No wheezing.  Abdominal:     General: Bowel sounds are normal.     Palpations: Abdomen is soft.     Tenderness: There is no abdominal tenderness.  Musculoskeletal:        General: No swelling or tenderness.  Lymphadenopathy:     Cervical: No cervical adenopathy.  Skin:    Findings: No erythema or rash.  Neurological:     Mental Status: She is alert.  Psychiatric:        Mood and Affect: Mood normal.        Behavior: Behavior normal.     BP 124/70 (BP Location: Left Arm, Jennifer [name]  Position: Sitting, Cuff Size: Normal)   Pulse 77   Temp 98.1 F (36.7 C) (Oral)   Resp 16   Wt 158 lb 9.6 oz (71.9 kg)   SpO2 97%   BMI 24.12 kg/m  Wt Readings from Last 3 Encounters:  02/08/18 158 lb 9.6 oz (71.9 kg)  01/11/18 158 lb 9.6 oz (71.9 kg)  12/20/17 159 lb 3.2 oz (72.2 kg)     Lab Results  Component Value Date   WBC 6.2 02/08/2018   HGB 13.2 02/08/2018   HCT 39.6 02/08/2018   PLT 280.0 02/08/2018   GLUCOSE 83 02/08/2018   CHOL 256 (H) 05/25/2017   TRIG 320.0 (H) 05/25/2017   HDL 45.30 05/25/2017   LDLDIRECT 161.0 05/25/2017   LDLCALC 157 07/24/2015   ALT 10 02/08/2018   AST 13 02/08/2018   NA 141 02/08/2018   K 4.3 02/08/2018   CL 103 02/08/2018   CREATININE 0.94 02/08/2018   BUN 8 02/08/2018   CO2 29 02/08/2018   TSH 1.55 05/25/2017   INR 1.00 05/18/2017   HGBA1C 5.3 12/15/2017    Dg Chest Port 1 View  Result Date: 12/16/2017 CLINICAL DATA:  Shortness of breath EXAM: PORTABLE CHEST 1 VIEW COMPARISON:  12/15/2017 FINDINGS: Prior CABG. Cardiomegaly. Interstitial edema, improved since prior study. No confluent opacities. Suspect small effusions. IMPRESSION: Mild interstitial edema, improved since prior study. Suspect small effusions. Electronically Signed  By: Rolm Baptise M.D.   On: 12/16/2017 08:33   Dg Chest Portable 1 View  Result Date: 12/15/2017 CLINICAL DATA:  Short of breath EXAM: PORTABLE CHEST 1 VIEW COMPARISON:  07/14/2017 FINDINGS: Mild progression of diffuse bilateral airspace disease consistent with edema. Progression of small bilateral effusions. Postop CABG. IMPRESSION: Mild progression of pulmonary edema and small pleural effusions. Electronically Signed   By: Franchot Gallo M.D.   On: 12/15/2017 11:22       Assessment & Plan:   Problem List Items Addressed This Visit    Acute respiratory failure with hypoxia [facility])    Recently admitted.  Initially on BiPAP.  Treated with duonebs and diuresed.  Seeing pulmonary.  Placed on Anoro.   Breathing better.  Follow.  Continue f/u with pulmonary and cardiology.        Anemia - Primary    Follow cbc.       Relevant Orders   CBC with Differential/Platelet (Completed)   Ferritin (Completed)   Atherosclerosis of native arteries of extremity with intermittent claudication ([place])    Seeing vascular surgery.  S/p revascularization.  Leg doing well.  Continue risk factor modification.  Follow.        CAD (coronary artery disease)    History of CABG. Followed by cardiology.  Continue risk factor modification. Due to see Dr Megan Salon next week.        Carotid stenosis    Followed by [facility].        CHF (congestive heart failure) ([place])    Recently admitted with CHF.  Diuresed.  Weighing regularly.  Stable.  Keep f/u with cardiology.        Hypercholesteremia    Has had intolerance to cholesterol medication - statin medication.  Have discussed other treatment options. She declines.        Relevant Orders   Hepatic function panel (Completed)   Hypertension    Blood pressure under good control.  Continue same medication regimen.  Follow pressures.  Follow metabolic panel.        Relevant Orders   Basic metabolic panel (Completed)   Peripheral vascular disease ([place])    Seeing [facility]. S/p revascularization.  Follow.            Einar Pheasant, MD

## 2018-02-13 ENCOUNTER — Encounter: Payer: Self-pay | Admitting: Internal Medicine

## 2018-02-13 ENCOUNTER — Other Ambulatory Visit: Payer: Self-pay | Admitting: Internal Medicine

## 2018-02-13 DIAGNOSIS — I10 Essential (primary) hypertension: Secondary | ICD-10-CM

## 2018-02-13 DIAGNOSIS — E7800 Pure hypercholesterolemia, unspecified: Secondary | ICD-10-CM

## 2018-02-13 NOTE — Assessment & Plan Note (Signed)
Seeing vascular surgery.  S/p revascularization.  Leg doing well.  Continue risk factor modification.  Follow.

## 2018-02-13 NOTE — Assessment & Plan Note (Signed)
Seeing [facility]. S/p revascularization.  Follow.

## 2018-02-13 NOTE — Assessment & Plan Note (Signed)
Recently admitted with CHF.  Diuresed.  Weighing regularly.  Stable.  Keep f/u with cardiology.

## 2018-02-13 NOTE — Assessment & Plan Note (Signed)
Has had intolerance to cholesterol medication - statin medication.  Have discussed other treatment options. She declines.

## 2018-02-13 NOTE — Assessment & Plan Note (Signed)
Followed by [facility].   

## 2018-02-13 NOTE — Assessment & Plan Note (Signed)
Blood pressure under good control.  Continue same medication regimen.  Follow pressures.  Follow metabolic panel.   

## 2018-02-13 NOTE — Progress Notes (Signed)
Orders placed for f/u labs.  

## 2018-02-13 NOTE — Assessment & Plan Note (Signed)
History of CABG. Followed by cardiology.  Continue risk factor modification. Due to see Dr Megan Salon next week.

## 2018-02-13 NOTE — Assessment & Plan Note (Signed)
Follow cbc.  

## 2018-02-13 NOTE — Assessment & Plan Note (Signed)
Recently admitted.  Initially on BiPAP.  Treated with duonebs and diuresed.  Seeing pulmonary.  Placed on Anoro.  Breathing better.  Follow.  Continue f/u with pulmonary and cardiology.

## 2018-02-16 ENCOUNTER — Other Ambulatory Visit: Payer: Self-pay | Admitting: Family

## 2018-02-16 ENCOUNTER — Other Ambulatory Visit: Payer: Self-pay | Admitting: Internal Medicine

## 2018-02-17 NOTE — Telephone Encounter (Signed)
rx ok'd for xanax #90 with no refills.

## 2018-02-21 DIAGNOSIS — E785 Hyperlipidemia, unspecified: Secondary | ICD-10-CM | POA: Diagnosis not present

## 2018-02-21 DIAGNOSIS — I429 Cardiomyopathy, unspecified: Secondary | ICD-10-CM | POA: Diagnosis not present

## 2018-02-21 DIAGNOSIS — I2510 Atherosclerotic heart disease of native coronary artery without angina pectoris: Secondary | ICD-10-CM | POA: Diagnosis not present

## 2018-03-08 DIAGNOSIS — R0609 Other forms of dyspnea: Secondary | ICD-10-CM | POA: Diagnosis not present

## 2018-03-08 DIAGNOSIS — J449 Chronic obstructive pulmonary disease, unspecified: Secondary | ICD-10-CM | POA: Diagnosis not present

## 2018-03-16 ENCOUNTER — Telehealth: Payer: Self-pay | Admitting: Internal Medicine

## 2018-03-16 NOTE — Telephone Encounter (Signed)
Death certificate placed out for you to sign

## 2018-03-16 NOTE — Telephone Encounter (Signed)
Death certificate placed up front for pick up

## 2018-03-16 NOTE — Telephone Encounter (Signed)
Signed and place in box.

## 2018-03-16 NOTE — Telephone Encounter (Signed)
Pt death certificate is up front for Dr Nicki Reaper to sign in color folder. Thank you!

## 2018-04-03 DEATH — deceased

## 2018-04-26 ENCOUNTER — Ambulatory Visit (INDEPENDENT_AMBULATORY_CARE_PROVIDER_SITE_OTHER): Payer: Medicare Other | Admitting: Vascular Surgery

## 2018-04-26 ENCOUNTER — Encounter (INDEPENDENT_AMBULATORY_CARE_PROVIDER_SITE_OTHER): Payer: Medicare Other

## 2018-07-14 ENCOUNTER — Encounter (INDEPENDENT_AMBULATORY_CARE_PROVIDER_SITE_OTHER): Payer: Medicare Other

## 2018-07-15 ENCOUNTER — Encounter (INDEPENDENT_AMBULATORY_CARE_PROVIDER_SITE_OTHER): Payer: Medicare Other

## 2018-07-15 ENCOUNTER — Ambulatory Visit (INDEPENDENT_AMBULATORY_CARE_PROVIDER_SITE_OTHER): Payer: Medicare Other | Admitting: Vascular Surgery

## 2018-10-07 ENCOUNTER — Ambulatory Visit: Payer: Medicare Other

## 2018-10-07 ENCOUNTER — Ambulatory Visit: Payer: Medicare Other | Admitting: Internal Medicine

## 2019-06-05 IMAGING — CT CT NECK W/ CM
4 of 5 series · 15 of 33 positions shown, 17 images · IV contrast (omnipaque)
Comparison: None.

CLINICAL DATA: 67-year-old female with intermittent of left ear
pain and neck pain radiating to the shoulder with no known injury.
Neck swelling.

EXAM:
CT NECK WITH CONTRAST
TECHNIQUE: Multidetector CT imaging of the neck was performed using the
standard protocol following the bolus administration of intravenous
contrast.
CONTRAST:  75mL OMNIPAQUE IOHEXOL 300 MG/ML  SOLN

[Series 2: axial neck · axial · 0.56mm/px · z∈[-180,-58]mm · 3 of 123 slices shown]
[im 31/123  bone]
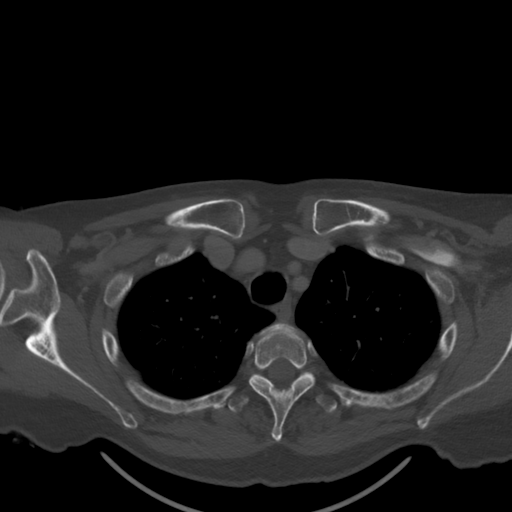
[im 62/123  bone]
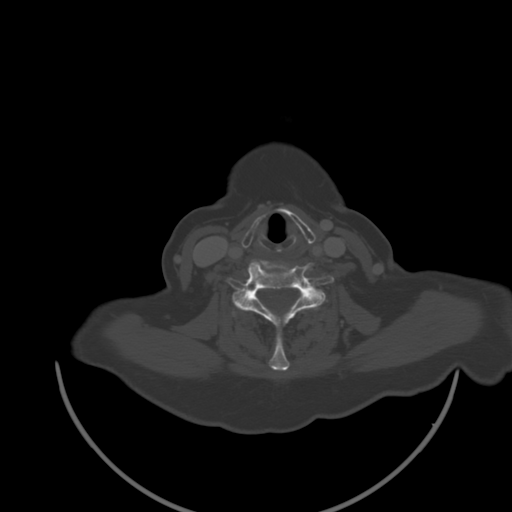
[im 92/123  bone]
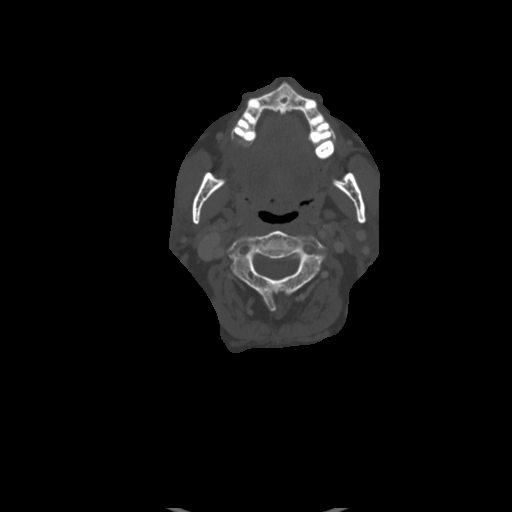

[Series 6: sag neck · sagittal · 0.49mm/px · 5 of 81 slices shown, 6 images]
[im 27/81  bone]
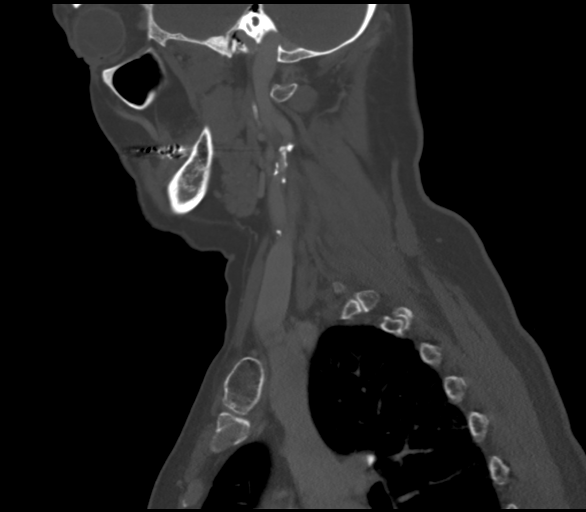
[im 34/81  bone]
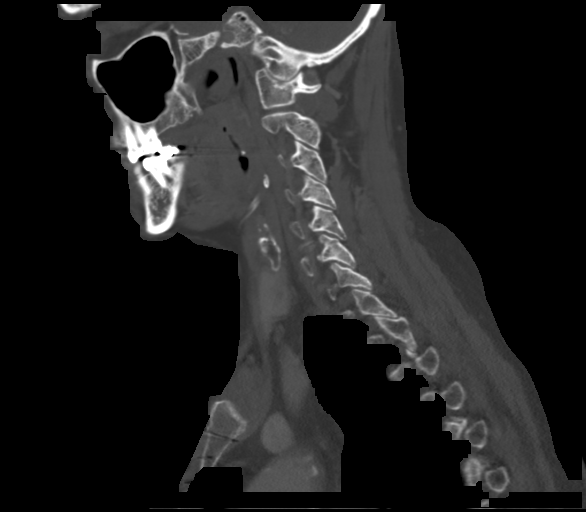
[im 41/81  soft-tissue]
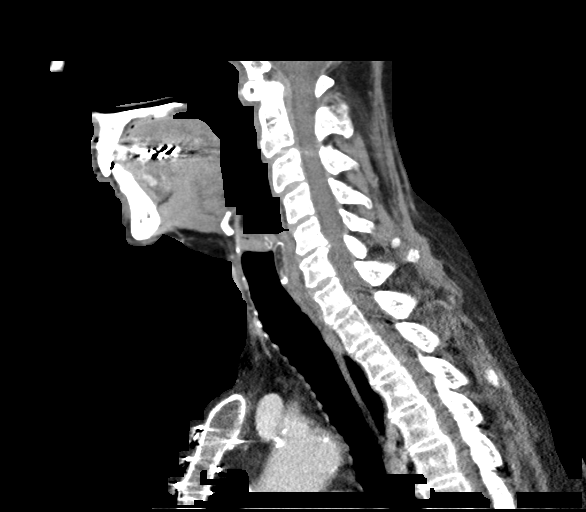
[im 41/81  bone]
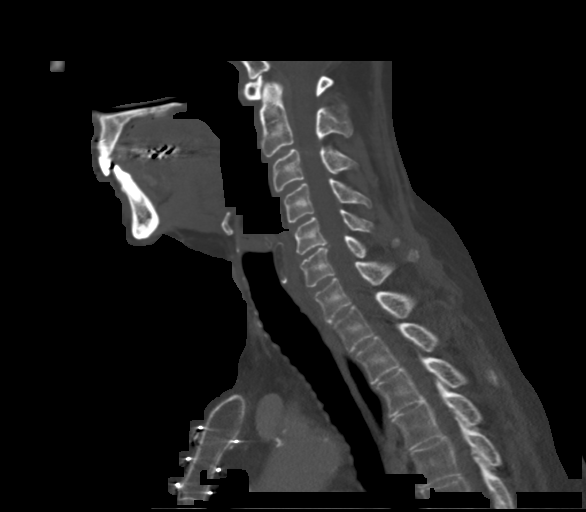
[im 47/81  bone]
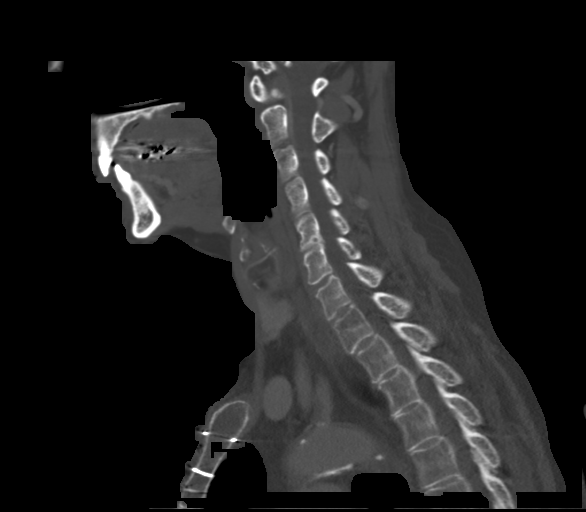
[im 54/81  bone]
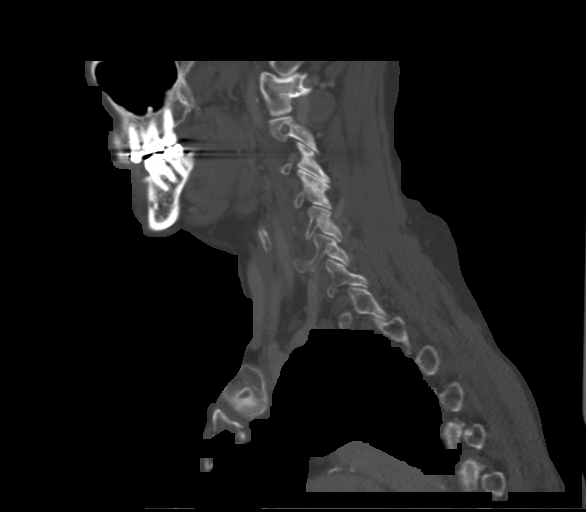

[Series 7: cor neck · coronal · 0.49mm/px · 3 of 138 slices shown]
[im 28/138  bone]
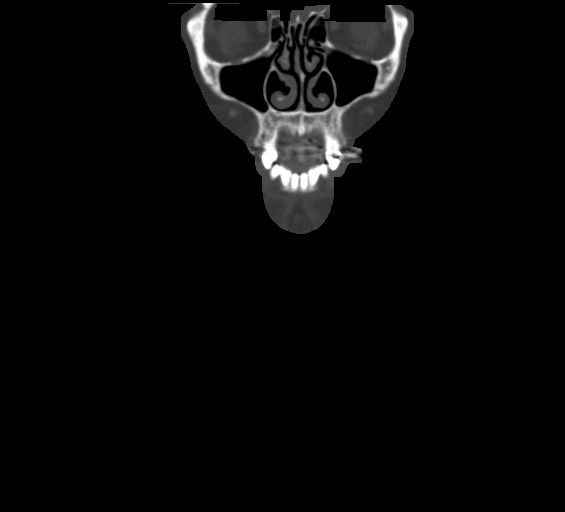
[im 55/138  bone]
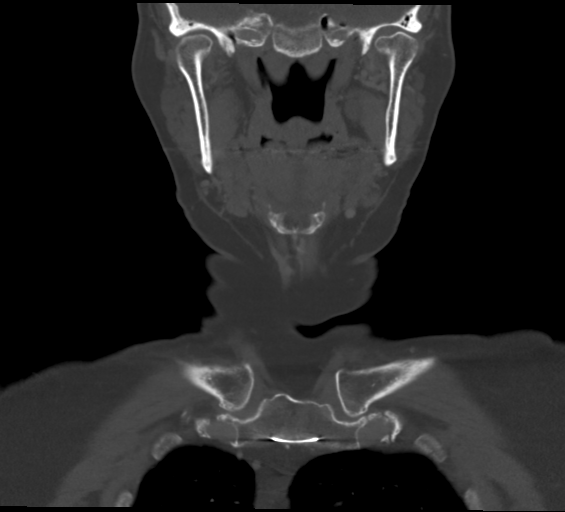
[im 83/138  bone]
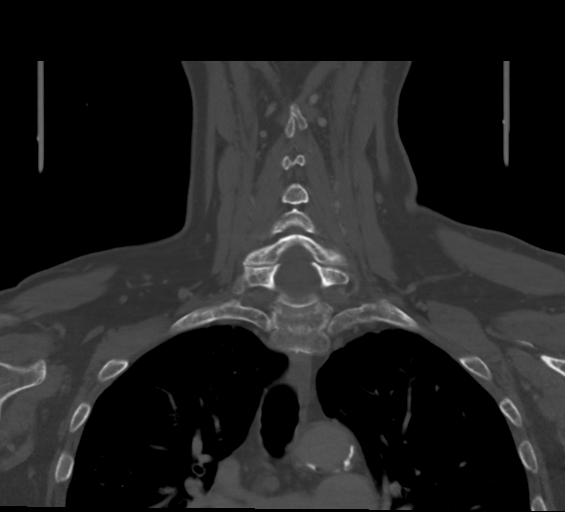

[Series 8: orthogonal ax · axial · 0.52mm/px · z∈[-210,-62]mm · 4 of 127 slices shown, 5 images]
[im 26/127  soft-tissue]
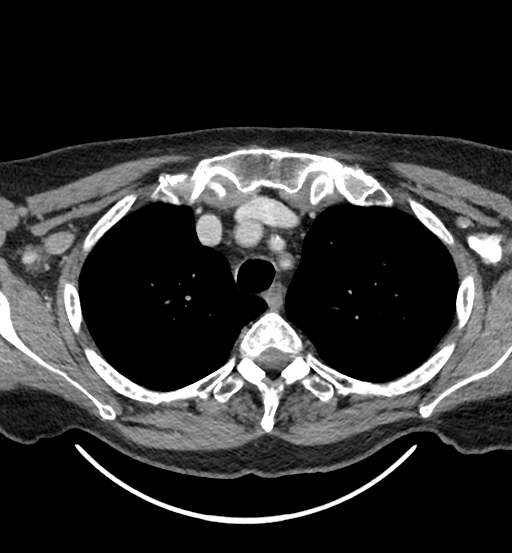
[im 26/127  bone]
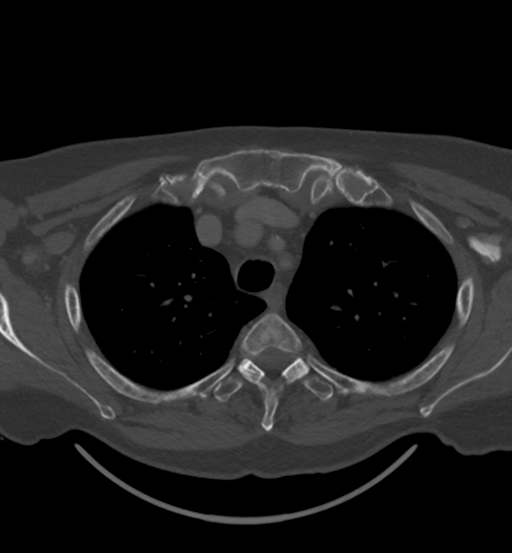
[im 51/127  bone]
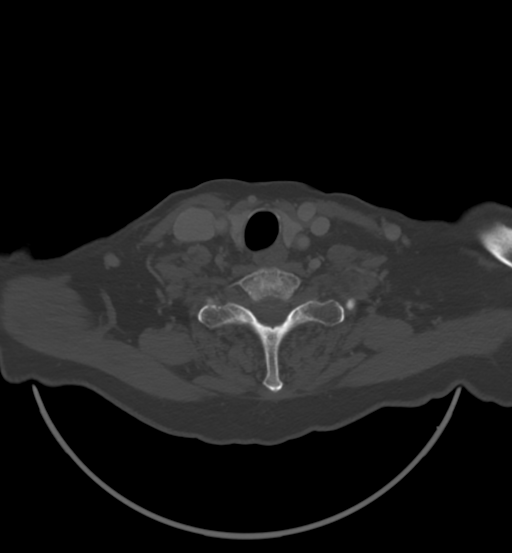
[im 76/127  bone]
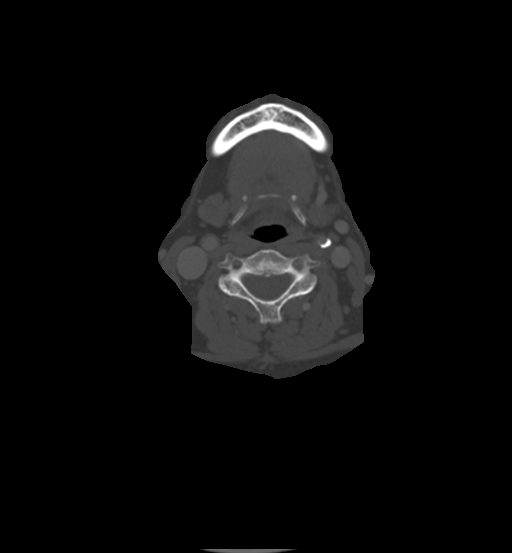
[im 101/127  bone]
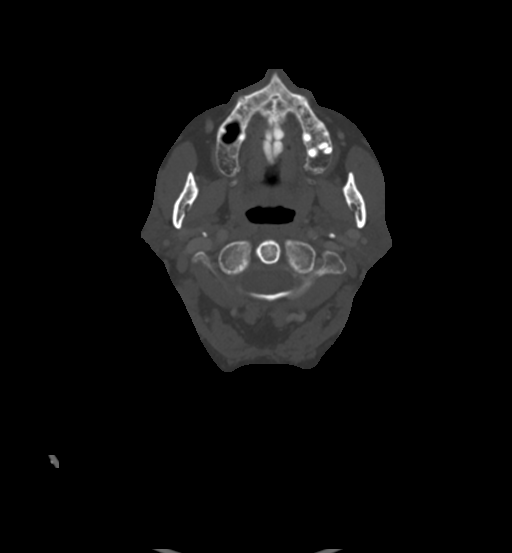

[15 of 33 positions shown; findings below may reference images not displayed]

FINDINGS: Pharynx and larynx: The glottis is closed. Glottic and subglottic
soft tissue contours are within normal limits.

Supraglottic larynx remarkable for a small 4-5 millimeter
circumscribed intermediate density mass in the vallecula inseparable
from the anterior surface of the epiglottis (series 2, image 48 and
sagittal image 42). Otherwise normal epiglottis and AE folds appear
within normal limits.

Otherwise negative hypopharynx. Mild postinflammatory calcifications
of the palatine tonsils. Negative nasopharynx and oropharynx soft
tissue contours. Negative parapharyngeal and retropharyngeal spaces.

Salivary glands: Negative sublingual space. Negative submandibular
glands aside from a punctate calcification along the anterior margin
of that on the left. No submandibular ductal enlargement identified.
Similar mild sialolithiasis in the left parotid gland. Negative
right parotid gland.

Thyroid: Negative.

Lymph nodes: Negative. No lymphadenopathy.

Vascular: The major vascular structures in the neck and at the skull
base are patent. But there is severe bilateral carotid
atherosclerosis. Bulky calcified plaque at both ICA origins appears
result in high-grade bilateral proximal ICA stenosis (on the left
series 2, image 48 resembling a radiographic string sign). Severe
calcified plaque in the dominant distal left vertebral artery also
appears hemodynamically significant.

Limited intracranial: Negative.

Visualized orbits: Postoperative changes to both globes, otherwise
negative.

Mastoids and visualized paranasal sinuses: Clear.

Skeleton: No acute osseous abnormality identified. Prior sternotomy.
Mildly elongated bilateral stylohyoid ligament calcification.

Upper chest: Calcified aortic atherosclerosis. No superior
mediastinal lymphadenopathy. Centrilobular emphysema. Negative
visible axillary lymph nodes.
IMPRESSION: 1. Small 4-5 mm indeterminant mass in the vallecula along the
anterior surface of the epiglottis. See sagittal image 42 and
sagittal image 42. Recommend direct visualization to evaluate
further. No other neck mass, lymphadenopathy.
2. Severe bilateral carotid atherosclerosis and high-grade bilateral
ICA origin stenosis.
3. Aortic Atherosclerosis (P179M-QCJ.J) and Emphysema (P179M-LUF.M).

## 2023-12-21 NOTE — Telephone Encounter (Signed)
 Open in error
